# Patient Record
Sex: Female | Born: 1990 | Race: White | Hispanic: No | Marital: Single | Smoking: Former smoker
Health system: Southern US, Community
[De-identification: ages and names within clinical notes are randomized; demographics above are authoritative.]

## PROBLEM LIST (undated history)

## (undated) ENCOUNTER — Ambulatory Visit: Admission: EM | Payer: Self-pay | Source: Home / Self Care

## (undated) ENCOUNTER — Ambulatory Visit: Admission: EM | Payer: Medicaid Other | Source: Home / Self Care

## (undated) ENCOUNTER — Emergency Department: Admission: EM | Source: Home / Self Care

## (undated) DIAGNOSIS — Z8619 Personal history of other infectious and parasitic diseases: Secondary | ICD-10-CM

## (undated) DIAGNOSIS — R87629 Unspecified abnormal cytological findings in specimens from vagina: Secondary | ICD-10-CM

## (undated) DIAGNOSIS — F32A Depression, unspecified: Secondary | ICD-10-CM

## (undated) DIAGNOSIS — M542 Cervicalgia: Secondary | ICD-10-CM

## (undated) DIAGNOSIS — F99 Mental disorder, not otherwise specified: Secondary | ICD-10-CM

## (undated) DIAGNOSIS — D649 Anemia, unspecified: Secondary | ICD-10-CM

## (undated) DIAGNOSIS — F1910 Other psychoactive substance abuse, uncomplicated: Secondary | ICD-10-CM

## (undated) DIAGNOSIS — F419 Anxiety disorder, unspecified: Secondary | ICD-10-CM

## (undated) DIAGNOSIS — L509 Urticaria, unspecified: Secondary | ICD-10-CM

## (undated) DIAGNOSIS — K759 Inflammatory liver disease, unspecified: Secondary | ICD-10-CM

## (undated) HISTORY — DX: Anemia, unspecified: D64.9

## (undated) HISTORY — DX: Personal history of other infectious and parasitic diseases: Z86.19

## (undated) HISTORY — DX: Depression, unspecified: F32.A

## (undated) HISTORY — DX: Inflammatory liver disease, unspecified: K75.9

## (undated) HISTORY — DX: Cervicalgia: M54.2

## (undated) HISTORY — DX: Unspecified abnormal cytological findings in specimens from vagina: R87.629

## (undated) HISTORY — DX: Other psychoactive substance abuse, uncomplicated: F19.10

## (undated) HISTORY — DX: Anxiety disorder, unspecified: F41.9

## (undated) HISTORY — DX: Mental disorder, not otherwise specified: F99

---
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
## 2009-01-08 ENCOUNTER — Emergency Department: Payer: Self-pay | Admitting: Emergency Medicine

## 2009-02-28 DIAGNOSIS — M542 Cervicalgia: Secondary | ICD-10-CM | POA: Insufficient documentation

## 2009-03-12 ENCOUNTER — Emergency Department: Payer: Self-pay | Admitting: Emergency Medicine

## 2009-03-31 ENCOUNTER — Emergency Department: Payer: Self-pay

## 2009-05-04 ENCOUNTER — Emergency Department: Payer: Self-pay | Admitting: Emergency Medicine

## 2009-08-06 ENCOUNTER — Emergency Department: Payer: Self-pay | Admitting: Emergency Medicine

## 2009-08-15 ENCOUNTER — Emergency Department: Payer: Self-pay | Admitting: Unknown Physician Specialty

## 2010-08-03 ENCOUNTER — Emergency Department: Payer: Self-pay | Admitting: Emergency Medicine

## 2011-12-21 ENCOUNTER — Observation Stay: Payer: Self-pay | Admitting: Obstetrics and Gynecology

## 2011-12-21 LAB — URINALYSIS, COMPLETE
Bilirubin,UR: NEGATIVE
Blood: NEGATIVE
Glucose,UR: NEGATIVE mg/dL (ref 0–75)
Ketone: NEGATIVE
Nitrite: NEGATIVE
Ph: 7 (ref 4.5–8.0)
Squamous Epithelial: 2

## 2011-12-23 LAB — URINE CULTURE

## 2012-01-24 ENCOUNTER — Inpatient Hospital Stay: Payer: Self-pay

## 2012-01-24 LAB — CBC WITH DIFFERENTIAL/PLATELET
Basophil #: 0 10*3/uL (ref 0.0–0.1)
HCT: 34.1 % — ABNORMAL LOW (ref 35.0–47.0)
HGB: 11.8 g/dL — ABNORMAL LOW (ref 12.0–16.0)
Lymphocyte %: 19.5 %
MCH: 32.4 pg (ref 26.0–34.0)
Monocyte #: 1.1 10*3/uL — ABNORMAL HIGH (ref 0.0–0.7)
Monocyte %: 10.7 %
Neutrophil #: 6.9 10*3/uL — ABNORMAL HIGH (ref 1.4–6.5)
Neutrophil %: 69.5 %
RDW: 13.9 % (ref 11.5–14.5)
WBC: 10 10*3/uL (ref 3.6–11.0)

## 2012-04-16 ENCOUNTER — Emergency Department: Payer: Self-pay | Admitting: Emergency Medicine

## 2012-04-16 LAB — CBC
HCT: 39.8 % (ref 35.0–47.0)
MCHC: 32.4 g/dL (ref 32.0–36.0)
MCV: 94 fL (ref 80–100)
Platelet: 256 10*3/uL (ref 150–440)
WBC: 11.6 10*3/uL — ABNORMAL HIGH (ref 3.6–11.0)

## 2012-04-16 LAB — COMPREHENSIVE METABOLIC PANEL
Alkaline Phosphatase: 117 U/L (ref 50–136)
Anion Gap: 9 (ref 7–16)
BUN: 10 mg/dL (ref 7–18)
Bilirubin,Total: 0.9 mg/dL (ref 0.2–1.0)
Calcium, Total: 9 mg/dL (ref 8.5–10.1)
Creatinine: 0.77 mg/dL (ref 0.60–1.30)
EGFR (Non-African Amer.): >60
Osmolality: 275 (ref 275–301)
SGOT(AST): 23 U/L (ref 15–37)
SGPT (ALT): 20 U/L
Sodium: 139 mmol/L (ref 136–145)
Total Protein: 8 g/dL (ref 6.4–8.2)

## 2012-04-16 LAB — PREGNANCY, URINE: Pregnancy Test, Urine: NEGATIVE m[IU]/mL

## 2012-04-18 LAB — URINE CULTURE

## 2012-11-03 ENCOUNTER — Emergency Department: Payer: Self-pay | Admitting: Emergency Medicine

## 2012-11-03 LAB — URINALYSIS, COMPLETE
Bacteria: NONE SEEN
Bilirubin,UR: NEGATIVE
Glucose,UR: NEGATIVE mg/dL (ref 0–75)
Ketone: NEGATIVE
Ph: 7 (ref 4.5–8.0)
RBC,UR: 98 /HPF (ref 0–5)
Specific Gravity: 1.023 (ref 1.003–1.030)
Squamous Epithelial: 2
WBC UR: 201 /HPF (ref 0–5)

## 2012-11-03 LAB — PREGNANCY, URINE: Pregnancy Test, Urine: NEGATIVE m[IU]/mL

## 2012-12-13 ENCOUNTER — Emergency Department: Payer: Self-pay | Admitting: Emergency Medicine

## 2012-12-13 LAB — URINALYSIS, COMPLETE
Bacteria: NONE SEEN
Blood: NEGATIVE
Nitrite: NEGATIVE
Ph: 6 (ref 4.5–8.0)
Protein: NEGATIVE
RBC,UR: 1 /HPF (ref 0–5)
Specific Gravity: 1.016 (ref 1.003–1.030)
WBC UR: 8 /HPF (ref 0–5)

## 2012-12-13 LAB — CBC
HCT: 34.8 % — ABNORMAL LOW (ref 35.0–47.0)
HGB: 11.6 g/dL — ABNORMAL LOW (ref 12.0–16.0)
MCH: 30.4 pg (ref 26.0–34.0)
Platelet: 260 10*3/uL (ref 150–440)
RBC: 3.82 10*6/uL (ref 3.80–5.20)

## 2012-12-13 LAB — COMPREHENSIVE METABOLIC PANEL WITH GFR
Albumin: 4 g/dL
Alkaline Phosphatase: 80 U/L
Anion Gap: 9
BUN: 8 mg/dL
Bilirubin,Total: 0.3 mg/dL
Calcium, Total: 8.7 mg/dL
Chloride: 106 mmol/L
Co2: 23 mmol/L
Creatinine: 0.47 mg/dL — ABNORMAL LOW
EGFR (African American): >60
EGFR (Non-African Amer.): >60
Glucose: 85 mg/dL
Osmolality: 273
Potassium: 3.5 mmol/L
SGOT(AST): 17 U/L
SGPT (ALT): 16 U/L
Sodium: 138 mmol/L
Total Protein: 7.7 g/dL

## 2012-12-13 LAB — HCG, QUANTITATIVE, PREGNANCY: Beta Hcg, Quant.: 79174 m[IU]/mL — ABNORMAL HIGH

## 2013-02-10 ENCOUNTER — Emergency Department: Payer: Self-pay | Admitting: Emergency Medicine

## 2013-02-10 LAB — COMPREHENSIVE METABOLIC PANEL
Albumin: 4 g/dL (ref 3.4–5.0)
Anion Gap: 5 — ABNORMAL LOW (ref 7–16)
BUN: 9 mg/dL (ref 7–18)
Calcium, Total: 8.4 mg/dL — ABNORMAL LOW (ref 8.5–10.1)
Co2: 27 mmol/L (ref 21–32)
Creatinine: 0.67 mg/dL (ref 0.60–1.30)
EGFR (African American): >60
Glucose: 81 mg/dL (ref 65–99)
Osmolality: 279 (ref 275–301)
Potassium: 3.5 mmol/L (ref 3.5–5.1)
SGOT(AST): 21 U/L (ref 15–37)
SGPT (ALT): 14 U/L (ref 12–78)
Sodium: 141 mmol/L (ref 136–145)
Total Protein: 7.6 g/dL (ref 6.4–8.2)

## 2013-02-10 LAB — CBC
HCT: 36.7 % (ref 35.0–47.0)
MCH: 31.2 pg (ref 26.0–34.0)
MCHC: 34 g/dL (ref 32.0–36.0)
Platelet: 222 10*3/uL (ref 150–440)
RBC: 4 10*6/uL (ref 3.80–5.20)

## 2013-02-10 LAB — URINALYSIS, COMPLETE
Bilirubin,UR: NEGATIVE
Glucose,UR: NEGATIVE mg/dL (ref 0–75)
Nitrite: NEGATIVE
Ph: 6 (ref 4.5–8.0)
Protein: NEGATIVE
Specific Gravity: 1.009 (ref 1.003–1.030)
WBC UR: 17 /HPF (ref 0–5)

## 2013-02-10 LAB — LIPASE, BLOOD: Lipase: 69 U/L — ABNORMAL LOW (ref 73–393)

## 2013-02-11 ENCOUNTER — Emergency Department: Payer: Self-pay | Admitting: Emergency Medicine

## 2013-02-11 LAB — BASIC METABOLIC PANEL
BUN: 11 mg/dL (ref 7–18)
Calcium, Total: 8.6 mg/dL (ref 8.5–10.1)
Chloride: 104 mmol/L (ref 98–107)
EGFR (Non-African Amer.): >60
Glucose: 78 mg/dL (ref 65–99)
Osmolality: 274 (ref 275–301)
Potassium: 3.1 mmol/L — ABNORMAL LOW (ref 3.5–5.1)
Sodium: 138 mmol/L (ref 136–145)

## 2013-02-11 LAB — CBC WITH DIFFERENTIAL/PLATELET
Basophil #: 0 10*3/uL (ref 0.0–0.1)
HCT: 36.3 % (ref 35.0–47.0)
Lymphocyte #: 1.9 10*3/uL (ref 1.0–3.6)
Lymphocyte %: 33.4 %
MCHC: 33.5 g/dL (ref 32.0–36.0)
MCV: 93 fL (ref 80–100)
Neutrophil #: 3.2 10*3/uL (ref 1.4–6.5)
Neutrophil %: 55.8 %
Platelet: 215 10*3/uL (ref 150–440)
RBC: 3.92 10*6/uL (ref 3.80–5.20)

## 2013-02-14 ENCOUNTER — Emergency Department: Payer: Self-pay | Admitting: Emergency Medicine

## 2014-05-25 ENCOUNTER — Inpatient Hospital Stay: Payer: Self-pay | Admitting: Obstetrics and Gynecology

## 2014-05-26 LAB — CBC WITH DIFFERENTIAL/PLATELET
BASOS ABS: 0 10*3/uL (ref 0.0–0.1)
Basophil %: 0.4 %
EOS ABS: 0.1 10*3/uL (ref 0.0–0.7)
Eosinophil %: 0.7 %
HCT: 34.4 % — ABNORMAL LOW (ref 35.0–47.0)
HGB: 11.5 g/dL — ABNORMAL LOW (ref 12.0–16.0)
Lymphocyte #: 2 10*3/uL (ref 1.0–3.6)
Lymphocyte %: 22 %
MCH: 31.7 pg (ref 26.0–34.0)
MCHC: 33.6 g/dL (ref 32.0–36.0)
MCV: 94 fL (ref 80–100)
Monocyte #: 0.8 x10 3/mm (ref 0.2–0.9)
Monocyte %: 8.2 %
NEUTROS ABS: 6.4 10*3/uL (ref 1.4–6.5)
Neutrophil %: 68.7 %
Platelet: 119 10*3/uL — ABNORMAL LOW (ref 150–440)
RBC: 3.64 10*6/uL — AB (ref 3.80–5.20)
RDW: 13.6 % (ref 11.5–14.5)
WBC: 9.3 10*3/uL (ref 3.6–11.0)

## 2014-05-26 LAB — DRUG SCREEN, URINE
Amphetamines, Ur Screen: NEGATIVE (ref ?–1000)
BENZODIAZEPINE, UR SCRN: NEGATIVE (ref ?–200)
Barbiturates, Ur Screen: NEGATIVE (ref ?–200)
Cannabinoid 50 Ng, Ur ~~LOC~~: POSITIVE (ref ?–50)
Cocaine Metabolite,Ur ~~LOC~~: POSITIVE (ref ?–300)
MDMA (Ecstasy)Ur Screen: NEGATIVE (ref ?–500)
METHADONE, UR SCREEN: NEGATIVE (ref ?–300)
Opiate, Ur Screen: POSITIVE (ref ?–300)
Phencyclidine (PCP) Ur S: NEGATIVE (ref ?–25)
Tricyclic, Ur Screen: NEGATIVE (ref ?–1000)

## 2014-05-26 LAB — GC/CHLAMYDIA PROBE AMP

## 2014-05-27 LAB — HEMATOCRIT: HCT: 28.6 % — ABNORMAL LOW (ref 35.0–47.0)

## 2015-03-03 NOTE — Op Note (Signed)
PATIENT NAME:  [name], Lori Pollard MR#:  [id number] DATE OF BIRTH:  1991-01-28  DATE OF PROCEDURE:  05/26/2014  PREOPERATIVE DIAGNOSES:  1.  Term pregnancy.  2.  Prior history of cesarean section.   POSTOPERATIVE DIAGNOSES:  1.  Term pregnancy.  2.  Prior history of cesarean section.   PROCEDURE PERFORMED: Low transverse cesarean section.   SURGEON: Dierdre Searles. Paul Tatumn Corbridge, MD   ANESTHESIA: Spinal.   ESTIMATED BLOOD LOSS: 250 mL.   COMPLICATIONS: None.   FINDINGS: Normal tubes, ovaries and uterus. Viable female weighing 4 pounds 12 ounces with Apgar scores of 8 and 9 at one and five minutes, respectively.   DISPOSITION: To the recovery room in stable condition.   TECHNIQUE: The patient is prepped and draped in the usual sterile fashion after adequate anesthesia is obtained in the supine position on the operating room table. Scalpel is used to create a low transverse skin incision and then the rectus fascia is dissected bilaterally using Mayo scissors. Rectus muscle is separated in the midline. The peritoneum is penetrated. The bladder is inferiorly retracted. A scalpel is used to create a low transverse hysterotomy incision that is extended by blunt dissection. Amniotomy then reveals clear fluid. Head is delivered with suctioning of the oropharynx and then vigorous cry. Infant is handed to the pediatric team.   Cord blood is obtained and the placenta is manually extracted. The uterus is externalized and cleansed of all membranes and debris using a moist sponge. The hysterotomy incision is closed using running #1 Vicryl suture in a locking fashion followed by a second layer to imbricate the first layer. Excellent hemostasis is noted. The uterus is placed back in the intra-abdominal cavity. The paracolic gutters are irrigated with warm saline. The rectus muscles are plicated and then the rectus fascia is closed with a 0 Maxon suture. Subcutaneous tissues are irrigated and hemostasis is assured using  electrocautery. Skin is closed with 4-0 Vicryl suture in a subcuticular fashion followed by Dermabond. The patient goes to the recovery room in stable condition having tolerated the procedure well. All sponge, instrument, and needle counts are correct.  ____________________________ R. Annamarie MajorPaul Rianne Degraaf, MD rph:sb Pollard: 05/26/2014 03:22:06 ET T: 05/26/2014 07:29:18 ET JOB#: [id number]  cc: Dierdre Searles. Paul Mitchel Delduca, MD, <Dictator> Nadara MustardOBERT P Retaj Hilbun MD ELECTRONICALLY SIGNED 05/29/2014 18:23

## 2015-03-04 NOTE — Op Note (Signed)
PATIENT NAME:  Lori Pollard, [name] MR#:  [id number] DATE OF BIRTH:  [birth year]  DATE OF PROCEDURE:  01/24/2012  PREOPERATIVE DIAGNOSES:  501. 24 year old G2, P2, 0-0-1-0 presenting in spontaneous labor at 39 weeks 2 days gestation.  2. Meconium stained amniotic fluid.  3. Nonreassuring fetal surveillance/fatal intolerance of labor.   POSTOPERATIVE DIAGNOSES:  431. 24 year old G2, P2, 0-0-1-0 presenting in spontaneous labor at 39 weeks 2 days gestation.  2. Meconium stained amniotic fluid.  3. Nonreassuring fetal surveillance/fatal intolerance of labor.   PROCEDURE PERFORMED: Primary low transverse cesarean section via Pfannenstiel skin incision.   SURGEON: Florina Oundreas M. Bonney AidStaebler, MD  ASSISTANT: Epimenio SarinShelby Street - Surgical Tech  ANESTHESIA: Spinal.   ESTIMATED BLOOD LOSS: 500 mL.  OPERATIVE FLUIDS: 1600 mL of crystalloid.   COMPLICATIONS: None.   INTRAOPERATIVE FINDINGS: Normal uterus, tubes, and ovaries. Infant in the OA position. Female. Weight 2680 grams (5 pounds, 15 ounces). Apgars 9 and 9.   DRAINS OR TUBES:  Foley to gravity drainage.   IMPLANTS: On-Q catheters.   ANTIBIOTICS: 2 grams of Ancef.   COMPLICATIONS: None.   PATIENT CONDITION FOLLOWING PROCEDURE: Stable.   PROCEDURE IN DETAIL: The patient presented in term labor and was rapidly progressing and was found to be 9 cm with bulging bag. Amniotomy was performed at that time noting meconium stained fluid. Shortly after artificial rupture of membranes, the fetus had a prolonged deceleration with a nadir in the 70s with slow return to baseline lasting approximately 7 minutes. Following this the patient began exhibiting repetitive late decelerations prompting administration of terbutaline. With administration of terbutaline, the fetal heart rate recovered and the fetus was allowed to resuscitate in utero. However, given the fetal tracing, the decision was made to proceed with low transverse cesarean section for delivery, also  secondary to the fact that the patient had some cervix reform following the rupture of membranes and was found to be 7 cm. The risks, benefits, and alternatives were discussed with the patient who elected to proceed with cesarean delivery.   The patient was taken to the Operating Room and had a spinal placed with continuous fetal monitoring during this time via FSE. The patient was then positioned in the supine position, prepped and draped in the usual fashion. A Pfannenstiel skin incision was made 2 cm above the pubic symphysis and carried down sharply to the rectus fascia which was incised with the scalpel. The rectus fascia was then extended bluntly using the operator's finger. The midline was identified. The muscles were separated bluntly, and the peritoneum was entered bluntly. A bladder blade was placed. Next, a low transverse uterine incision was made and the uterus was entered bluntly using the operator's finger. The infant was noted to be in the OA position. The vertex was grasped, flexed, brought to hysterotomy incision and delivered atraumatically using fundal pressure. The infant began crying almost instantaneously. The cord was clamped and cut, and the infant was passed to the awaiting pediatricians. A cord blood sample as well as cord gas were obtained. Cord gas revealed a pH of 7.31 with a base excess of minus 2.3. Following delivery of the infant, the placenta was delivered via manual extraction. The uterus was then exteriorized and wiped clean of clots and debris. The hysterotomy incision was closed using a two layer closure of 0 Vicryl, the first layer being a running locked stitch and the second a horizontal imbricating. Following this the hysterotomy incision was inspected and noted to be hemostatic. The uterus was returned  to the abdomen. The gutters were wiped clean of clots using two moist laps. The peritoneum was then closed using a running stitch of 2-0 Vicryl. Two On-Q catheters were then  placed through the superior border of the rectus fascia and positioned underneath the rectus fascia. The rectus fascia was then closed using a single running #1 looped PDS. The subcutaneous tissue was irrigated and hemostasis was achieved using the Bovie. The skin was closed using a 4-0 Monocryl in a subcuticular fashion. The On-Q catheters were primed with 5 mL of 0.5% bupivacaine each. The patient tolerated the procedure well. Sponge, needle, and instrument counts were correct x2. The patient was taken to Recovery Room in stable condition. ____________________________ Florina Ou. Bonney Aid, MD ams:slb D: 01/25/2012 14:12:00 ET T: 01/25/2012 14:35:51 ET JOB#: [id number]  cc: Florina Ou. Bonney Aid, MD, <Dictator> [place] Cathrine Muster MD ELECTRONICALLY SIGNED 01/28/2012 13:26

## 2015-03-20 NOTE — H&P (Signed)
[name]:  History Expanded:  HPI 24 yo G3P1011 at 4638 2/[redacted] weeks EGA w IUGR stable w surveillance who presents w contraction and pain tonight, since 0700 earlier today.  No vaginal bleeding or ROM.  [facility]  Planned for 39 week Cesarean Section for prior. Smoker.  O+, RI, VI, GBS -,   Gravida 32   Term 1   PreTerm 0   Abortion 1   Living 1   Blood Type (Maternal) O positive   Group B Strep Results Maternal (Result >5wks must be treated as unknown) negative   Maternal Varicella Immune   Rubella Results (Maternal) immune   EDC 07-Jun-2014   Presents with contractions   Patient's Medical History Underweight   Patient's Surgical History Previous C-Section   Medications Pre Natal Vitamins   Allergies NKDA   Social History tobacco   Family History Non-Contributory   ROS:  ROS All systems were reviewed.  HEENT, CNS, GI, GU, Respiratory, CV, Renal and Musculoskeletal systems were found to be normal.   Exam:  Vital Signs stable   General no apparent distress   Mental Status clear   Chest clear   Heart normal sinus rhythm   Abdomen gravid, tender with contractions   Estimated Fetal Weight Average for gestational age   Back no CVAT   Edema no edema   Pelvic no external lesions, 1-2/80/0, Vtx engaging downward, bloody show   Mebranes Intact   FHT normal rate with no decels   Ucx regular   Ucx Frequency 8 min   Skin dry   Impression:  Impression early labor   Plan:  Plan EFM/NST, monitor contractions and for cervical change   Comments Cesarean Section vs continued exp management, due to pain and 38+ weeks will proceed w Cesarean Section.  Risks of IUGR discussed.   LTCS Consent: I have had an exceedingly long and careful discussion with this patient about her circumstances and options available. She understands that I have carefully evaluated the infant's fetal heart rate pattern, the  probable time remaining in her labor and her reserve. We are at a point where one of them will need to take a potential risk, either she take the risk of a cesarean section or the infant take a risk that the fetal intolerance to labor is very significant with the potential for a real effect on the baby which will worsen if we allow labor to continue.  She also understands that interpretation of the FHT is not a precise science and that someone else might allow labor to continue for the present. Mutual decision made to proceed with abdominal surgery.  Fully informed consent obtained including the risks of anesthesia, hemorrhage, infection, injury to adjacent structures, bowel, bladder, and blood vessels..  Electronic Signatures: Letitia LibraHarris, Loraine Freid Paul (MD)  (Signed 17-Jul-15 01:21)  Authored: [name], Consent   Last Updated: 17-Jul-15 01:21 by Letitia LibraHarris, Breckyn Ticas Paul (MD)

## 2015-03-20 NOTE — H&P (Signed)
[name]:  History:   HPI 24 yo G2P0010 at 5471w2d prsenting with contractions.  +FM, no LOF, no VB    Presents with contractions    Patient's Medical History anemia    Patient's Surgical History none    Medications Pre Natal Vitamins    Allergies NKDA    Social History tobacco   Exam:   Vital Signs stable    Urine Protein not completed    General other, painfully contracting    Abdomen gravid, tender with contractions    Estimated Fetal Weight Small for gestational age    Fetal Position vtx    Pelvic no external lesions, 1/C/-2 change to 5/C/-1 per nursing    Mebranes Intact    FHT normal rate with no decels    Ucx regular   Impression:   Impression active labor   Plan:   Plan EFM/NST, monitor contractions and for cervical change    Comments - stadol while awiating epidural, admit for term labor   Electronic Signatures: Lori Pollard, Lori Pollard (MD)  (Signed 16-Mar-13 11:16)  Authored: [name]   Last Updated: 16-Mar-13 11:16 by Lori Pollard, Lori Pollard (MD)

## 2015-03-20 NOTE — H&P (Signed)
[name]:  History:   HPI 24 yo G2 P0010 at 3262w5d GA by 9 week u/s, pregnancy complicated by smoking.  Presents with contractions since about 5pm.  She had been out shopping and walking all day.  Her contractions have come closer and were closer together. She notes +FM, no LOF, no VB.  O+, RI, RPR NR, VZI, GBS unk    Presents with contractions    Patient's Medical History No Chronic Illness    Patient's Surgical History none    Medications Pre Natal Vitamins    Allergies NKDA    Social History tobacco    Family History Non-Contributory   ROS:   ROS All systems were reviewed.  HEENT, CNS, GI, GU, Respiratory, CV, Renal and Musculoskeletal systems were found to be normal.   Exam:   Vital Signs stable    General no apparent distress    Mental Status clear    Chest clear    Heart normal sinus rhythm    Abdomen gravid, non-tender    Pelvic cervix closed and thick, per RN    Mebranes Intact    FHT normal rate with no decels    FHT Description 130/mod var/+accels/no decels    Ucx irregular, 1q 10 min   Impression:   Impression preterm contractions, no evidence of labor   Plan:   Plan UA, EFM/NST, monitor contractions and for cervical change, discharge    Comments discharge will f/u urine culture, but not treat at this time keep previously scheduled appt., but have low threshold to make work-in appt, if symptoms continue or worsen.    Follow Up Appointment already scheduled   Electronic Signatures: Conard NovakJackson, Glyn Zendejas D (MD)  (Signed 10-Feb-13 22:01)  Authored: [name]   Last Updated: 10-Feb-13 22:01 by Conard NovakJackson, Solash Tullo D (MD)

## 2015-08-16 ENCOUNTER — Emergency Department
Admission: EM | Admit: 2015-08-16 | Discharge: 2015-08-16 | Disposition: A | Discharge status: Home / Self Care | Payer: Medicaid Other | Attending: Emergency Medicine | Admitting: Emergency Medicine

## 2015-08-16 ENCOUNTER — Encounter: Payer: Self-pay | Admitting: Emergency Medicine

## 2015-08-16 DIAGNOSIS — L509 Urticaria, unspecified: Secondary | ICD-10-CM | POA: Insufficient documentation

## 2015-08-16 DIAGNOSIS — R21 Rash and other nonspecific skin eruption: Secondary | ICD-10-CM | POA: Diagnosis present

## 2015-08-16 HISTORY — DX: Urticaria, unspecified: L50.9

## 2015-08-16 MED ORDER — DIPHENHYDRAMINE HCL 50 MG/ML IJ SOLN
INTRAMUSCULAR | Status: AC
  Filled 2015-08-16: qty 1

## 2015-08-16 MED ORDER — DIPHENHYDRAMINE HCL 50 MG/ML IJ SOLN
50.0000 mg | Freq: Once | INTRAMUSCULAR | Status: AC
  Administered 2015-08-16: 50 mg via INTRAVENOUS
  Filled 2015-08-16: qty 1

## 2015-08-16 MED ORDER — DIPHENHYDRAMINE HCL 50 MG/ML IJ SOLN
25.0000 mg | Freq: Once | INTRAMUSCULAR | Status: AC
  Administered 2015-08-16: 25 mg via INTRAVENOUS
  Filled 2015-08-16: qty 1

## 2015-08-16 MED ORDER — METHYLPREDNISOLONE 4 MG PO TBPK: ORAL_TABLET | ORAL | Status: DC

## 2015-08-16 MED ORDER — DIPHENHYDRAMINE HCL 25 MG PO CAPS: 25.0000 mg | ORAL_CAPSULE | ORAL | Status: DC | PRN

## 2015-08-16 MED ORDER — METHYLPREDNISOLONE SODIUM SUCC 125 MG IJ SOLR
125.0000 mg | Freq: Once | INTRAMUSCULAR | Status: AC
  Administered 2015-08-16: 125 mg via INTRAVENOUS
  Filled 2015-08-16: qty 2

## 2015-08-16 MED ORDER — FAMOTIDINE IN NACL 20-0.9 MG/50ML-% IV SOLN
20.0000 mg | Freq: Once | INTRAVENOUS | Status: AC
  Administered 2015-08-16: 20 mg via INTRAVENOUS
  Filled 2015-08-16: qty 50

## 2015-08-16 NOTE — ED Notes (Signed)
Pt woke this morning with hives and itching. States she has a history of hives with cause unknown. Pt denies using any new skin care products, detergents or new foods.

## 2015-08-16 NOTE — ED Provider Notes (Signed)
[facility] Emergency Department Provider Note     Time seen: ----------------------------------------- 7:06 AM on 08/16/2015 -----------------------------------------    I have reviewed the triage vital signs and the nursing notes.   HISTORY  Chief Complaint No chief complaint on file.    HPI Lori Pollard is a 24 y.o. female who presents ER with hives and itching all over her body. Patient states slowly progressing this very pruritic. Patient reports a history of same, hasn't had one in 3 years. She has recently started her menstrual cycle which is not started in the last 3 years. She also takes Suboxone, but has been on this for 10 months. She denies any changes in her soap, detergent or shampoos. No past medical history on file.  There are no active problems to display for this patient.   No past surgical history on file.  Allergies Review of patient's allergies indicates not on file.  Social History Social History  Substance Use Topics  . Smoking status: Not on file  . Smokeless tobacco: Not on file  . Alcohol Use: Not on file    Review of Systems Constitutional: Negative for fever. Eyes: Negative for visual changes. ENT: Negative for sore throat. Cardiovascular: Negative for chest pain. Respiratory: Negative for shortness of breath. Gastrointestinal: Negative for abdominal pain, vomiting and diarrhea. Genitourinary: Negative for dysuria. Musculoskeletal: Negative for back pain. Skin: Positive for rash and itching Neurological: Negative for headaches, focal weakness or numbness.  10-point ROS otherwise negative.  ____________________________________________   PHYSICAL EXAM:  VITAL SIGNS: ED Triage Vitals  Enc Vitals Group     BP --      Pulse --      Resp --      Temp --      Temp src --      SpO2 --      Weight --      Height --      Head Cir --      Peak Flow --      Pain Score --      Pain Loc --    Pain Edu? --      Excl. in GC? --     Constitutional: Alert and oriented. Mild distress Eyes: Conjunctivae are normal. PERRL. Normal extraocular movements. ENT   Head: Normocephalic and atraumatic.   Nose: No congestion/rhinnorhea.   Mouth/Throat: Mucous membranes are moist.   Neck: No stridor. Cardiovascular: Normal rate, regular rhythm. Normal and symmetric distal pulses are present in all extremities. No murmurs, rubs, or gallops. Respiratory: Normal respiratory effort without tachypnea nor retractions. Breath sounds are clear and equal bilaterally. No wheezes/rales/rhonchi. Gastrointestinal: Soft and nontender. No distention.  Musculoskeletal: Nontender with normal range of motion in all extremities. No joint effusions.  No lower extremity tenderness nor edema. Neurologic:  Normal speech and language. No gross focal neurologic deficits are appreciated. Speech is normal. No gait instability. Skin:  Diffuse urticarial lesions are noted over her trunk and extremities. The face seems to be spared at this time. Psychiatric: Mood and affect are normal. Speech and behavior are normal. Patient exhibits appropriate insight and judgment.  ____________________________________________  ED COURSE:  Pertinent labs & imaging results that were available during my care of the patient were reviewed by me and considered in my medical decision making (see chart for details). Patient with urticaria, questionable hormonal or stress related.  ____________________________________________  FINAL ASSESSMENT AND PLAN  Urticaria  Plan: Patient with labs and imaging as dictated  above. Patient was given Benadryl, Pepcid and Solu-Medrol. Patient did require repeat dosing of Benadryl and Solu-Medrol here. She'll be discharged with Benadryl and Medrol Dosepak. She is stable for outpatient follow-up.   Emily Filbert, MD   Emily Filbert, MD 08/16/15 [phone]

## 2015-08-16 NOTE — ED Notes (Signed)
Pt rash decreased. Pt states itching is greatly reduced. Pt resting, boyfriend at bedside.

## 2015-08-16 NOTE — Discharge Instructions (Signed)
Hives Hives are itchy, red, swollen areas of the skin. They can vary in size and location on your body. Hives can come and go for hours or several days (acute hives) or for several weeks (chronic hives). Hives do not spread from person to person (noncontagious). They may get worse with scratching, exercise, and emotional stress. CAUSES   Allergic reaction to food, additives, or drugs.  Infections, including the common cold.  Illness, such as vasculitis, lupus, or thyroid disease.  Exposure to sunlight, heat, or cold.  Exercise.  Stress.  Contact with chemicals. SYMPTOMS   Red or white swollen patches on the skin. The patches may change size, shape, and location quickly and repeatedly.  Itching.  Swelling of the hands, feet, and face. This may occur if hives develop deeper in the skin. DIAGNOSIS  Your caregiver can usually tell what is wrong by performing a physical exam. Skin or blood tests may also be done to determine the cause of your hives. In some cases, the cause cannot be determined. TREATMENT  Mild cases usually get better with medicines such as antihistamines. Severe cases may require an emergency epinephrine injection. If the cause of your hives is known, treatment includes avoiding that trigger.  HOME CARE INSTRUCTIONS   Avoid causes that trigger your hives.  Take antihistamines as directed by your caregiver to reduce the severity of your hives. Non-sedating or low-sedating antihistamines are usually recommended. Do not drive while taking an antihistamine.  Take any other medicines prescribed for itching as directed by your caregiver.  Wear loose-fitting clothing.  Keep all follow-up appointments as directed by your caregiver. SEEK MEDICAL CARE IF:   You have persistent or severe itching that is not relieved with medicine.  You have painful or swollen joints. SEEK IMMEDIATE MEDICAL CARE IF:   You have a fever.  Your tongue or lips are swollen.  You have  trouble breathing or swallowing.  You feel tightness in the throat or chest.  You have abdominal pain. These problems may be the first sign of a life-threatening allergic reaction. Call your local emergency services (911 in [place] MAKE SURE YOU:   Understand these instructions.  Will watch your condition.  Will get help right away if you are not doing well or get worse.   This information is not intended to replace advice given to you by your health care provider. Make sure you discuss any questions you have with your health care provider.   Document Released: 10/27/2005 Document Revised: 11/01/2013 Document Reviewed: 01/20/2012 Elsevier Interactive Patient Education 2016 [employer]  

## 2015-08-16 NOTE — ED Notes (Signed)
Pt presents with widespread rash and itching. Denies difficulty breathing. No acute distress noted.

## 2015-08-16 NOTE — ED Notes (Signed)
Patient presents for hives over entire body. States they are progressing and very itchy. Denies any new contacts, soaps or lotions. States just itchy. Denies difficulty breathing.

## 2015-09-18 ENCOUNTER — Ambulatory Visit (INDEPENDENT_AMBULATORY_CARE_PROVIDER_SITE_OTHER): Payer: Medicaid Other | Admitting: Family Medicine

## 2015-09-18 ENCOUNTER — Encounter: Payer: Self-pay | Admitting: Family Medicine

## 2015-09-18 VITALS — BP 104/68 | HR 85 | Temp 97.9°F | Resp 16 | Ht 59.0 in | Wt 87.9 lb

## 2015-09-18 DIAGNOSIS — N926 Irregular menstruation, unspecified: Secondary | ICD-10-CM | POA: Diagnosis not present

## 2015-09-18 DIAGNOSIS — K5903 Drug induced constipation: Secondary | ICD-10-CM | POA: Insufficient documentation

## 2015-09-18 DIAGNOSIS — R599 Enlarged lymph nodes, unspecified: Secondary | ICD-10-CM | POA: Diagnosis not present

## 2015-09-18 DIAGNOSIS — F3131 Bipolar disorder, current episode depressed, mild: Secondary | ICD-10-CM

## 2015-09-18 DIAGNOSIS — F4310 Post-traumatic stress disorder, unspecified: Secondary | ICD-10-CM | POA: Insufficient documentation

## 2015-09-18 DIAGNOSIS — Z23 Encounter for immunization: Secondary | ICD-10-CM | POA: Diagnosis not present

## 2015-09-18 DIAGNOSIS — N644 Mastodynia: Secondary | ICD-10-CM

## 2015-09-18 DIAGNOSIS — J45909 Unspecified asthma, uncomplicated: Secondary | ICD-10-CM | POA: Insufficient documentation

## 2015-09-18 DIAGNOSIS — F319 Bipolar disorder, unspecified: Secondary | ICD-10-CM | POA: Insufficient documentation

## 2015-09-18 DIAGNOSIS — L509 Urticaria, unspecified: Secondary | ICD-10-CM | POA: Diagnosis not present

## 2015-09-18 DIAGNOSIS — R590 Localized enlarged lymph nodes: Secondary | ICD-10-CM

## 2015-09-18 DIAGNOSIS — F1121 Opioid dependence, in remission: Secondary | ICD-10-CM | POA: Insufficient documentation

## 2015-09-18 LAB — POCT URINE PREGNANCY: Preg Test, Ur: NEGATIVE

## 2015-09-18 MED ORDER — DIVALPROEX SODIUM 250 MG PO DR TAB
250.0000 mg | DELAYED_RELEASE_TABLET | Freq: Two times a day (BID) | ORAL | Status: DC
Start: 2015-09-18 — End: 2019-06-16

## 2015-09-18 MED ORDER — AMOXICILLIN-POT CLAVULANATE 875-125 MG PO TABS: 1.0000 | ORAL_TABLET | Freq: Two times a day (BID) | ORAL | Status: DC

## 2015-09-18 NOTE — Progress Notes (Signed)
Name: Lori Pollard   MRN: [id number]    DOB: 1990-12-25   Date:09/18/2015       Progress Note  Subjective  Chief Complaint  Chief Complaint  Patient presents with  . Contraception    Since patient has had nexplanon put in on 09/17/2014, the past month she has been had bleeding non-stop, constant hives, and mood swings. Patient went to the ER June 16, 2015 with Hives and was diagnosed with Urticaria, given Prednisone, Benadryl, and Pepcid. Patient would like to discuss other opinions of birth control. Patient also is having a swollen milk gland on left breast.     HPI  Heavy menses irregular: she was started on Nexplanon after the birth of her second child last Summer, she was in a period amenorrhea, but on October 6th, she started to bleed heavily and non stop for the past month. Going from light to heavy cycles. She also has noticed the the hives started happening at the same time. Gets red, itchy and swollen around the nexplanon site and spreads throughout her entire body. She has had a few episodes of hives since October 6th. She has a history of having hives in the past. She was seen by an allergist in the past and was diagnosed with stress.  She states symptoms improves with Benadryl.    Left breast pain: over the past 3 days she noticed swelling, pain and increase in warmth on left nipple. No trauma, not nursing.  She states started after last episode of hives, but has been constant, pain is described as burning and stabbing.  No nipple discharge. Not taking any medications  Lymphadenopathy: behind right ear for months now, growing slowly, no associated night sweats, positive for mild weight loss.   Bipolar Disorder: she is now depressed, stressed because she is a mother of two young children, has difficulty falling asleep. No recent episodes of mania. Used to see psychiatrist years ago and tried 7 different types of medication but states nothing worked. (Cymbalta, Lexapro,  Clonazepam...)   Patient Active Problem List   Diagnosis Date Noted  . Bipolar disorder (HCC) 09/18/2015  . PTSD (post-traumatic stress disorder) 09/18/2015  . RAD (reactive airway disease) with wheezing 09/18/2015  . History of narcotic addiction (HCC) 09/18/2015  . Constipation due to pain medication therapy 09/18/2015  . Cervical pain 02/28/2009    Past Surgical History  Procedure Laterality Date  . Cesarean section      Family History  Problem Relation Age of Onset  . Alcohol abuse Mother   . GER disease Mother   . Depression Mother   . Alcohol abuse Father   . Emphysema Father     Social History   Social History  . Marital Status: Single    Spouse Name: N/A  . Number of Children: N/A  . Years of Education: N/A   Occupational History  . Not on file.   Social History Main Topics  . Smoking status: Current Every Day Smoker -- 0.50 packs/day for 11 years    Types: Cigarettes    Start date: 09/17/2004  . Smokeless tobacco: Never Used  . Alcohol Use: No  . Drug Use: No  . Sexual Activity:    Partners: Male    Birth Control/ Protection: Implant   Other Topics Concern  . Not on file   Social History Narrative     Current outpatient prescriptions:  .  albuterol (PROVENTIL) (2.5 MG/3ML) 0.083% nebulizer solution, Inhale into the lungs., Disp: ,  Rfl:  .  diphenhydrAMINE (BENADRYL) 25 mg capsule, Take 1 capsule (25 mg total) by mouth every 4 (four) hours as needed., Disp: 30 capsule, Rfl: 2 .  docusate sodium (COLACE) 100 MG capsule, Take by mouth., Disp: , Rfl:  .  SUBOXONE 8-2 MG FILM, Place 2.5 Film under the tongue daily as needed., Disp: , Rfl: 0 .  etonogestrel (NEXPLANON) 68 MG IMPL implant, 1 each (68 mg total) by Subdermal route once., Disp: 1 each, Rfl: 0  No Known Allergies   ROS  Constitutional: Negative for fever , mild  weight change.  Respiratory: Negative for cough and shortness of breath.   Cardiovascular: Negative for chest pain or  palpitations.  Gastrointestinal: Negative for abdominal pain, no bowel changes.  Musculoskeletal: Negative for gait problem or joint swelling.  Skin: Positive for intermittent hives Neurological: Negative for dizziness or headache.  No other specific complaints in a complete review of systems (except as listed in HPI above).  Objective  Filed Vitals:   09/18/15 1557  BP: 104/68  Pulse: 85  Temp: 97.9 F (36.6 C)  TempSrc: Oral  Resp: 16  Height: 4\' 11"  (1.499 m)  Weight: 87 lb 14.4 oz (39.871 kg)  SpO2: 93%    Body mass index is 17.74 kg/(m^2).  Physical Exam  Constitutional: Patient appears well-developed and well-nourished.  No distress.  HEENT: head atraumatic, normocephalic, pupils equal and reactive to light,  neck supple, throat within normal limits. Non-tender right posterior auricular lymphadenopathy Cardiovascular: Normal rate, regular rhythm and normal heart sounds.  No murmur heard. No BLE edema. Pulmonary/Chest: Effort normal and breath sounds normal. No respiratory distress. Abdominal: Soft.  There is no tenderness. Psychiatric: Patient has a normal mood and affect. behavior is normal. Judgment and thought content normal. Skin: no hives Breast: dime size tender area, red and increase in warmth area at 5 o'clock on left breast   PHQ2/9: Depression screen PHQ 2/9 09/18/2015  Decreased Interest 1  Down, Depressed, Hopeless 1  PHQ - 2 Score 2  Altered sleeping 3  Tired, decreased energy 3  Change in appetite 3  Feeling bad or failure about yourself  0  Trouble concentrating 0  Moving slowly or fidgety/restless 3  Suicidal thoughts 0  PHQ-9 Score 14  Difficult doing work/chores Somewhat difficult    Fall Risk: Fall Risk  09/18/2015  Falls in the past year? No     Functional Status Survey: Is the patient deaf or have difficulty hearing?: No Does the patient have difficulty seeing, even when wearing glasses/contacts?: No Does the patient have  difficulty concentrating, remembering, or making decisions?: No Does the patient have difficulty walking or climbing stairs?: No Does the patient have difficulty dressing or bathing?: No Does the patient have difficulty doing errands alone such as visiting a doctor's office or shopping?: No    Assessment & Plan   1. Pain of left breast  Possible duct inflammation  - amoxicillin-clavulanate (AUGMENTIN) 875-125 MG tablet; Take 1 tablet by mouth 2 (two) times daily.  Dispense: 20 tablet; Refill: 0  2. Hives  Explained likely unrelated to Nexplanon, but she would like to have it removed  3. Irregular periods/menstrual cycles  - CBC with Differential/Platelet - POCT urine pregnancy - Ambulatory referral to Obstetrics / Gynecology  4. Needs flu shot  - Flu Vaccine QUAD 36+ mos IM  5. Lymphadenopathy, postauricular  - amoxicillin-clavulanate (AUGMENTIN) 875-125 MG tablet; Take 1 tablet by mouth 2 (two) times daily.  Dispense: 20 tablet;  Refill: 0  6. Bipolar affective disorder, currently depressed, mild (HCC)  Start mood stabilizer  - divalproex (DEPAKOTE) 250 MG DR tablet; Take 1 tablet (250 mg total) by mouth 2 (two) times daily.  Dispense: 60 tablet; Refill: 0

## 2015-09-19 LAB — CBC WITH DIFFERENTIAL/PLATELET
BASOS ABS: 0 10*3/uL (ref 0.0–0.2)
BASOS: 0 %
EOS (ABSOLUTE): 0.1 10*3/uL (ref 0.0–0.4)
Eos: 1 %
Hematocrit: 35.6 % (ref 34.0–46.6)
Hemoglobin: 11.9 g/dL (ref 11.1–15.9)
IMMATURE GRANS (ABS): 0 10*3/uL (ref 0.0–0.1)
IMMATURE GRANULOCYTES: 0 %
LYMPHS: 37 %
Lymphocytes Absolute: 2.3 10*3/uL (ref 0.7–3.1)
MCH: 30 pg (ref 26.6–33.0)
MCHC: 33.4 g/dL (ref 31.5–35.7)
MCV: 90 fL (ref 79–97)
Monocytes Absolute: 0.6 10*3/uL (ref 0.1–0.9)
Monocytes: 9 %
NEUTROS PCT: 53 %
Neutrophils Absolute: 3.4 10*3/uL (ref 1.4–7.0)
PLATELETS: 255 10*3/uL (ref 150–379)
RBC: 3.97 x10E6/uL (ref 3.77–5.28)
RDW: 13.6 % (ref 12.3–15.4)
WBC: 6.3 10*3/uL (ref 3.4–10.8)

## 2015-09-25 ENCOUNTER — Telehealth: Payer: Self-pay | Admitting: Family Medicine

## 2015-09-25 NOTE — Telephone Encounter (Signed)
Errenous °

## 2015-09-28 ENCOUNTER — Ambulatory Visit: Payer: Medicaid Other | Admitting: Family Medicine

## 2015-10-10 ENCOUNTER — Encounter: Payer: Medicaid Other | Admitting: Certified Nurse Midwife

## 2015-10-15 ENCOUNTER — Ambulatory Visit: Payer: Medicaid Other | Admitting: Family Medicine

## 2016-04-16 ENCOUNTER — Ambulatory Visit: Payer: Medicaid Other | Admitting: Family Medicine

## 2016-04-29 ENCOUNTER — Ambulatory Visit: Payer: Medicaid Other | Admitting: Family Medicine

## 2017-06-24 ENCOUNTER — Ambulatory Visit (INDEPENDENT_AMBULATORY_CARE_PROVIDER_SITE_OTHER): Payer: Medicaid Other | Admitting: Certified Nurse Midwife

## 2017-06-24 ENCOUNTER — Encounter: Payer: Self-pay | Admitting: Certified Nurse Midwife

## 2017-06-24 VITALS — BP 89/63 | HR 74 | Ht 59.0 in | Wt 82.1 lb

## 2017-06-24 DIAGNOSIS — Z3040 Encounter for surveillance of contraceptives, unspecified: Secondary | ICD-10-CM

## 2017-06-24 DIAGNOSIS — Z30432 Encounter for removal of intrauterine contraceptive device: Secondary | ICD-10-CM | POA: Diagnosis not present

## 2017-06-24 DIAGNOSIS — Z3202 Encounter for pregnancy test, result negative: Secondary | ICD-10-CM | POA: Diagnosis not present

## 2017-06-24 LAB — POCT URINE PREGNANCY: Preg Test, Ur: NEGATIVE

## 2017-06-24 MED ORDER — MEDROXYPROGESTERONE ACETATE 150 MG/ML IM SUSP: 150.0000 mg | Freq: Once | INTRAMUSCULAR | Status: DC

## 2017-06-24 NOTE — Progress Notes (Signed)
Lori Pollard is a 26 y.o. year old No obstetric history on file. Caucasian female here for Nexplanon removal.  She was given informed consent for removal and reinsertion of her Nexplanon. Her Nexplanon was placed 11/18 2015, No LMP recorded. Patient has had an implant.for 3 years, and her pregnancy test today was negative.    BP (!) 89/63   Pulse 74   Ht 4\' 11"  (1.499 m)   Wt 82 lb 1.6 oz (37.2 kg)   BMI 16.58 kg/m  No LMP recorded. Patient has had an implant. Results for orders placed or performed in visit on 06/24/17 (from the past 24 hour(s))  POCT urine pregnancy   Collection Time: 06/24/17  2:49 PM  Result Value Ref Range   Preg Test, Ur Negative Negative     Appropriate time out taken. Nexplanon site identified.  Area prepped in usual sterile fashon. Two cc's of 2% lidocaine was used to anesthetize the area. A small stab incision was made right beside the implant on the distal portion.  The Nexplanon rod was grasped using hemostats and removed intact without difficulty.  Steri-strips and a pressure bandage was applied.  There was less than 3 cc blood loss. There were no complications.  The patient tolerated the procedure well. She would like to use depo provera. She has used this in the past. Prescription placed. She will schedule nurse appointment for injection tomorrow. Red flag symptoms reviewed.    Follow-up PRN problems.  Doreene BurkeAnnie Sol Odor, CNM

## 2017-06-24 NOTE — Patient Instructions (Signed)

## 2017-06-25 ENCOUNTER — Ambulatory Visit: Payer: Medicaid Other | Admitting: Certified Nurse Midwife

## 2017-06-25 ENCOUNTER — Encounter: Payer: Medicaid Other | Admitting: Family Medicine

## 2017-06-26 ENCOUNTER — Ambulatory Visit: Payer: Medicaid Other

## 2017-07-02 ENCOUNTER — Other Ambulatory Visit: Payer: Self-pay

## 2017-07-02 ENCOUNTER — Telehealth: Payer: Self-pay

## 2017-07-02 ENCOUNTER — Ambulatory Visit (INDEPENDENT_AMBULATORY_CARE_PROVIDER_SITE_OTHER): Payer: Medicaid Other | Admitting: Certified Nurse Midwife

## 2017-07-02 VITALS — BP 108/70 | HR 78 | Ht 59.0 in | Wt 83.7 lb

## 2017-07-02 DIAGNOSIS — Z3042 Encounter for surveillance of injectable contraceptive: Secondary | ICD-10-CM | POA: Diagnosis not present

## 2017-07-02 MED ORDER — MEDROXYPROGESTERONE ACETATE 150 MG/ML IM SUSP: 150.0000 mg | INTRAMUSCULAR | 3 refills | Status: DC

## 2017-07-02 MED ORDER — MEDROXYPROGESTERONE ACETATE 150 MG/ML IM SUSP
150.0000 mg | Freq: Once | INTRAMUSCULAR | Status: AC
  Administered 2017-07-02: 150 mg via INTRAMUSCULAR

## 2017-07-02 NOTE — Telephone Encounter (Signed)
Pt called stating rx for depo-provera not a pharmacy, rx sent.

## 2017-07-02 NOTE — Telephone Encounter (Signed)
Error

## 2017-07-02 NOTE — Patient Instructions (Signed)

## 2017-07-27 ENCOUNTER — Ambulatory Visit: Payer: Self-pay | Admitting: Family Medicine

## 2017-07-28 ENCOUNTER — Ambulatory Visit: Payer: Self-pay | Admitting: Family Medicine

## 2017-09-21 ENCOUNTER — Ambulatory Visit: Payer: Medicaid Other

## 2017-09-28 ENCOUNTER — Ambulatory Visit (INDEPENDENT_AMBULATORY_CARE_PROVIDER_SITE_OTHER): Payer: Medicaid Other | Admitting: Certified Nurse Midwife

## 2017-09-28 ENCOUNTER — Other Ambulatory Visit: Payer: Self-pay

## 2017-09-28 VITALS — BP 120/68 | HR 81 | Wt 87.0 lb

## 2017-09-28 DIAGNOSIS — Z3040 Encounter for surveillance of contraceptives, unspecified: Secondary | ICD-10-CM

## 2017-09-28 LAB — POCT URINE PREGNANCY: Preg Test, Ur: NEGATIVE

## 2017-09-28 MED ORDER — MEDROXYPROGESTERONE ACETATE 150 MG/ML IM SUSP
150.0000 mg | Freq: Once | INTRAMUSCULAR | Status: AC
  Administered 2017-09-28: 150 mg via INTRAMUSCULAR

## 2017-09-28 NOTE — Progress Notes (Signed)
Patient ID: Lori ParrChristina [name], female   DOB: 1991-09-19, 26 y.o.   MRN: [id number]  Pt presents for Depo-Provera injection (given), no c/o side effects.  UPT: negative.

## 2017-11-07 ENCOUNTER — Emergency Department: Payer: Medicaid Other

## 2017-11-07 ENCOUNTER — Emergency Department
Admission: EM | Admit: 2017-11-07 | Discharge: 2017-11-07 | Disposition: A | Discharge status: Home / Self Care | Payer: Medicaid Other | Attending: Emergency Medicine | Admitting: Emergency Medicine

## 2017-11-07 ENCOUNTER — Other Ambulatory Visit: Payer: Self-pay

## 2017-11-07 ENCOUNTER — Encounter: Payer: Self-pay | Admitting: Emergency Medicine

## 2017-11-07 DIAGNOSIS — L02413 Cutaneous abscess of right upper limb: Secondary | ICD-10-CM | POA: Diagnosis not present

## 2017-11-07 DIAGNOSIS — F1990 Other psychoactive substance use, unspecified, uncomplicated: Secondary | ICD-10-CM | POA: Insufficient documentation

## 2017-11-07 DIAGNOSIS — F17210 Nicotine dependence, cigarettes, uncomplicated: Secondary | ICD-10-CM | POA: Diagnosis not present

## 2017-11-07 LAB — CBC WITH DIFFERENTIAL/PLATELET
BASOS ABS: 0 10*3/uL (ref 0–0.1)
Basophils Relative: 1 %
EOS PCT: 4 %
Eosinophils Absolute: 0.3 10*3/uL (ref 0–0.7)
HCT: 33.8 % — ABNORMAL LOW (ref 35.0–47.0)
HEMOGLOBIN: 11.5 g/dL — AB (ref 12.0–16.0)
Lymphocytes Relative: 29 %
Lymphs Abs: 2.5 10*3/uL (ref 1.0–3.6)
MCH: 30.7 pg (ref 26.0–34.0)
MCHC: 34.1 g/dL (ref 32.0–36.0)
MCV: 89.9 fL (ref 80.0–100.0)
Monocytes Absolute: 0.6 10*3/uL (ref 0.2–0.9)
Monocytes Relative: 7 %
NEUTROS ABS: 5.2 10*3/uL (ref 1.4–6.5)
NEUTROS PCT: 59 %
PLATELETS: 263 10*3/uL (ref 150–440)
RBC: 3.76 MIL/uL — AB (ref 3.80–5.20)
RDW: 13.3 % (ref 11.5–14.5)
WBC: 8.6 10*3/uL (ref 3.6–11.0)

## 2017-11-07 LAB — COMPREHENSIVE METABOLIC PANEL
ALK PHOS: 112 U/L (ref 38–126)
ALT: 10 U/L — AB (ref 14–54)
AST: 22 U/L (ref 15–41)
Albumin: 4 g/dL (ref 3.5–5.0)
Anion gap: 7 (ref 5–15)
BUN: 13 mg/dL (ref 6–20)
CALCIUM: 8.9 mg/dL (ref 8.9–10.3)
CHLORIDE: 103 mmol/L (ref 101–111)
CO2: 25 mmol/L (ref 22–32)
CREATININE: 0.58 mg/dL (ref 0.44–1.00)
GFR calc Af Amer: >60 mL/min (ref >60)
Glucose, Bld: 94 mg/dL (ref 65–99)
Potassium: 4 mmol/L (ref 3.5–5.1)
Sodium: 135 mmol/L (ref 135–145)
Total Bilirubin: 0.6 mg/dL (ref 0.3–1.2)
Total Protein: 7.9 g/dL (ref 6.5–8.1)

## 2017-11-07 MED ORDER — IBUPROFEN 800 MG PO TABS: 800.0000 mg | ORAL_TABLET | Freq: Three times a day (TID) | ORAL | 0 refills | Status: DC | PRN

## 2017-11-07 MED ORDER — VANCOMYCIN HCL IN DEXTROSE 1-5 GM/200ML-% IV SOLN
1000.0000 mg | Freq: Once | INTRAVENOUS | Status: AC
  Administered 2017-11-07: 1000 mg via INTRAVENOUS
  Filled 2017-11-07: qty 200

## 2017-11-07 MED ORDER — SODIUM CHLORIDE 0.9 % IV BOLUS (SEPSIS)
1000.0000 mL | Freq: Once | INTRAVENOUS | Status: AC
  Administered 2017-11-07: 1000 mL via INTRAVENOUS

## 2017-11-07 MED ORDER — NICOTINE 14 MG/24HR TD PT24
MEDICATED_PATCH | TRANSDERMAL | Status: AC
  Administered 2017-11-07: 14 mg via TRANSDERMAL
  Filled 2017-11-07: qty 1

## 2017-11-07 MED ORDER — NICOTINE 14 MG/24HR TD PT24
14.0000 mg | MEDICATED_PATCH | Freq: Once | TRANSDERMAL | Status: DC
  Administered 2017-11-07: 14 mg via TRANSDERMAL

## 2017-11-07 MED ORDER — PIPERACILLIN SOD-TAZOBACTAM SO 2.25 (2-0.25) G IV SOLR
Freq: Once | INTRAVENOUS | Status: AC
  Administered 2017-11-07: 16:00:00 via INTRAVENOUS
  Filled 2017-11-07: qty 2.25

## 2017-11-07 MED ORDER — METHYLPREDNISOLONE SODIUM SUCC 125 MG IJ SOLR
125.0000 mg | Freq: Once | INTRAMUSCULAR | Status: AC
  Administered 2017-11-07: 125 mg via INTRAVENOUS

## 2017-11-07 MED ORDER — PIPERACILLIN-TAZOBACTAM IN DEX 2-0.25 GM/50ML IV SOLN: 2.2500 g | Freq: Once | INTRAVENOUS | Status: DC

## 2017-11-07 MED ORDER — DIPHENHYDRAMINE HCL 50 MG/ML IJ SOLN
50.0000 mg | Freq: Once | INTRAMUSCULAR | Status: AC
  Administered 2017-11-07: 50 mg via INTRAVENOUS

## 2017-11-07 MED ORDER — PIPERACILLIN-TAZOBACTAM 3.375 G IVPB
INTRAVENOUS | Status: AC
Start: 2017-11-07 — End: 2017-11-07
  Administered 2017-11-07: 3.375 g
  Filled 2017-11-07: qty 50

## 2017-11-07 MED ORDER — TRAMADOL HCL 50 MG PO TABS
50.0000 mg | ORAL_TABLET | Freq: Once | ORAL | Status: AC
  Administered 2017-11-07: 50 mg via ORAL
  Filled 2017-11-07: qty 1

## 2017-11-07 MED ORDER — NICOTINE 7 MG/24HR TD PT24: 7.0000 mg | MEDICATED_PATCH | Freq: Once | TRANSDERMAL | Status: DC

## 2017-11-07 MED ORDER — METHYLPREDNISOLONE SODIUM SUCC 125 MG IJ SOLR
INTRAMUSCULAR | Status: AC
  Administered 2017-11-07: 125 mg via INTRAVENOUS
  Filled 2017-11-07: qty 2

## 2017-11-07 MED ORDER — DIPHENHYDRAMINE HCL 50 MG/ML IJ SOLN
INTRAMUSCULAR | Status: AC
  Administered 2017-11-07: 50 mg via INTRAVENOUS
  Filled 2017-11-07: qty 1

## 2017-11-07 MED ORDER — LIDOCAINE HCL (PF) 1 % IJ SOLN
5.0000 mL | Freq: Once | INTRAMUSCULAR | Status: AC
  Administered 2017-11-07: 5 mL via INTRADERMAL

## 2017-11-07 MED ORDER — SULFAMETHOXAZOLE-TRIMETHOPRIM 800-160 MG PO TABS: 1.0000 | ORAL_TABLET | Freq: Two times a day (BID) | ORAL | 0 refills | Status: DC

## 2017-11-07 MED ORDER — CEPHALEXIN 500 MG PO CAPS: 500.0000 mg | ORAL_CAPSULE | Freq: Three times a day (TID) | ORAL | 0 refills | Status: AC

## 2017-11-07 MED ORDER — KETOROLAC TROMETHAMINE 30 MG/ML IJ SOLN
30.0000 mg | Freq: Once | INTRAMUSCULAR | Status: AC
  Administered 2017-11-07: 30 mg via INTRAVENOUS
  Filled 2017-11-07: qty 1

## 2017-11-07 MED ORDER — LIDOCAINE HCL (PF) 1 % IJ SOLN
INTRAMUSCULAR | Status: AC
  Administered 2017-11-07: 5 mL via INTRADERMAL
  Filled 2017-11-07: qty 5

## 2017-11-07 NOTE — Discharge Instructions (Signed)
Take the antibiotics as prescribed, return in 2 days for a wound check, or you could see her regular doctor, leave the packing in place for the next 2 days, if you begin to run a fever, or the wound is becoming worse please return to the emergency department

## 2017-11-07 NOTE — ED Notes (Signed)
Pt reports feeling itchy and redness noted to chest and back. Provider at bedside to change pump to 14000mL/hour. RN to bedside to administer medications. Pt remains alert and oriented at this time. No resp distress noted. No decrease on LOC.

## 2017-11-07 NOTE — ED Notes (Signed)
Pt has abscess to right forearm.  No drainage now.  Area red and tender to touch. Pt is iv drug user.

## 2017-11-07 NOTE — ED Notes (Signed)
No redness noted to pts skin and pt reports feeling back to "normal" pt reports drowsiness but also verbalized understanding due to medication. PT Ambulatory at time of discharge.

## 2017-11-07 NOTE — ED Notes (Signed)
Pt is stable at this time. Provider at bedside. Pt remains alert and oriented x 4. No resp changes. Pt denies itchiness and redness has decreased.

## 2017-11-07 NOTE — ED Provider Notes (Signed)
[facility] Emergency Department Provider Note  ____________________________________________   First MD Initiated Contact with Patient 11/07/17 1503     (approximate)  I have reviewed the triage vital signs and the nursing notes.   HISTORY  Chief Complaint Abscess    HPI Lori Pollard is a 26 y.o. female complains of an abscess to the right forearm, states she was injecting IV cocaine, she noticed the red area about a week ago and now has drainage and smells really bad, she denies fever chills, she used to be on Suboxone but ran out of her prescription, she states that when she started to use drugs again, she denies nausea, vomiting, or diarrhea  Past Medical History:  Diagnosis Date  . Cervicalgia   . History of chickenpox   . Hives     Patient Active Problem List   Diagnosis Date Noted  . Bipolar disorder (HCC) 09/18/2015  . PTSD (post-traumatic stress disorder) 09/18/2015  . RAD (reactive airway disease) with wheezing 09/18/2015  . History of narcotic addiction (HCC) 09/18/2015  . Constipation due to pain medication therapy 09/18/2015  . Cervical pain 02/28/2009    Past Surgical History:  Procedure Laterality Date  . CESAREAN SECTION      Prior to Admission medications   Medication Sig Start Date End Date Taking? Authorizing Provider  albuterol (PROVENTIL) (2.5 MG/3ML) 0.083% nebulizer solution Inhale into the lungs. 09/11/14   [provider]  cephALEXin (KEFLEX) 500 MG capsule Take 1 capsule (500 mg total) by mouth 3 (three) times daily for 10 days. 11/07/17 11/17/17  Quanta Robertshaw, Roselyn BeringSusan W, PA-C  divalproex (DEPAKOTE) 250 MG DR tablet Take 1 tablet (250 mg total) by mouth 2 (two) times daily. 09/18/15   Alba CorySowles, Krichna, MD  docusate sodium (COLACE) 100 MG capsule Take by mouth. 09/11/14   [provider]  ibuprofen (ADVIL,MOTRIN) 800 MG tablet Take 1 tablet (800 mg total) by mouth every 8 (eight) hours as needed.  11/07/17   Sherrie MustacheFisher, Roselyn BeringSusan W, PA-C  medroxyPROGESTERone (DEPO-PROVERA) 150 MG/ML injection Inject 1 mL (150 mg total) into the muscle every 3 (three) months. 07/02/17   Doreene Burkehompson, Annie, CNM  SUBOXONE 8-2 MG FILM Place 2.5 Film under the tongue daily as needed. 08/02/15   [provider]  sulfamethoxazole-trimethoprim (BACTRIM DS,SEPTRA DS) 800-160 MG tablet Take 1 tablet by mouth 2 (two) times daily. 11/07/17   Faythe GheeFisher, Kelleigh Skerritt W, PA-C    Allergies Patient has no known allergies.  Family History  Problem Relation Age of Onset  . Alcohol abuse Mother   . GER disease Mother   . Depression Mother   . Alcohol abuse Father   . Emphysema Father     Social History Social History   Tobacco Use  . Smoking status: Current Every Day Smoker    Packs/day: 0.50    Years: 11.00    Pack years: 5.50    Types: Cigarettes    Start date: 09/17/2004  . Smokeless tobacco: Never Used  Substance Use Topics  . Alcohol use: No    Alcohol/week: 0.0 oz  . Drug use: No    Review of Systems  Constitutional: No fever/chills Eyes: No visual changes. ENT: No sore throat. Respiratory: Denies cough Genitourinary: Negative for dysuria. Musculoskeletal: Negative for back pain. Skin: Positive for red swollen area on the right forearm    ____________________________________________   PHYSICAL EXAM:  VITAL SIGNS: ED Triage Vitals  Enc Vitals Group     BP 11/07/17 1435 115/79  Pulse Rate 11/07/17 1435 86     Resp 11/07/17 1435 18     Temp 11/07/17 1435 98.4 F (36.9 C)     Temp Source 11/07/17 1521 Oral     SpO2 11/07/17 1435 100 %     Weight 11/07/17 1436 95 lb (43.1 kg)     Height 11/07/17 1436 4\' 11"  (1.499 m)     Head Circumference --      Peak Flow --      Pain Score 11/07/17 1435 10     Pain Loc --      Pain Edu? --      Excl. in GC? --     Constitutional: Alert and oriented. Well appearing and in no acute distress.  Patient is very thin and pale Eyes: Conjunctivae are  normal.  Head: Atraumatic. Nose: No congestion/rhinnorhea. Mouth/Throat: Mucous membranes are moist.  Throat is normal Cardiovascular: Normal rate, regular rhythm.  Heart sounds are normal Respiratory: Normal respiratory effort.  No retractions, lungs are clear to auscultation GU: deferred Musculoskeletal: FROM all extremities, warm and well perfused Neurologic:  Normal speech and language.  Skin:  Skin is warm, dry , right forearm is positive for a golf ball sized abscess, with a foul odor, there is a large scab at the top with some discharge and swelling, neurovascular is intact psychiatric: Mood and affect are normal. Speech and behavior are normal.  ____________________________________________   LABS (all labs ordered are listed, but only abnormal results are displayed)  Labs Reviewed  CBC WITH DIFFERENTIAL/PLATELET - Abnormal; Notable for the following components:      Result Value   RBC 3.76 (*)    Hemoglobin 11.5 (*)    HCT 33.8 (*)    All other components within normal limits  COMPREHENSIVE METABOLIC PANEL - Abnormal; Notable for the following components:   ALT 10 (*)    All other components within normal limits   ____________________________________________   ____________________________________________  RADIOLOGY  X-ray of the right forearm is negative for foreign body or osteomyelitis  ____________________________________________   PROCEDURES  Procedure(s) performed: IV normal saline, vancomycin, Zosyn, Toradol 30 mg,  INCISION AND DRAINAGE Performed by: Faythe GheeSusan W Dehaven Sine Consent: Verbal consent obtained. Risks and benefits: risks, benefits and alternatives were discussed Type: abscess  Body area: right forearm  Anesthesia: local infiltration  Incision was made with a scalpel. #11 blade  Local anesthetic: lidocaine 1%  Anesthetic total: 5 ml  Complexity: complex Incision and drainage, debrided necrotic tissue  Drainage: purulent  Drainage  amount: 1-2 ml  Packing material: 1/4 in iodoform gauze  Patient tolerance: Patient tolerated the procedure well with no immediate complications.      ____________________________________________   INITIAL IMPRESSION / ASSESSMENT AND PLAN / ED COURSE  Pertinent labs & imaging results that were available during my care of the patient were reviewed by me and considered in my medical decision making (see chart for details).  Patient is a 26 year old IV drug user, with a large abscess on the right forearm, she noticed that it had some drainage and now has a very foul odor, she tried to pop it and got some pus out of it, x-ray of the right forearm is negative for foreign body or osteomyelitis, labs are ordered    ----------------------------------------- 11:26 PM on 11/07/2017 -----------------------------------------  Patient's labs are normal, when discussing antibiotic treatment, Dr. Cyril LoosenKinner recommended vancomycin and Zosyn due to the IV drug use, during the infusion the patient developed "red man syndrome "from  the vancomycin, Solu-Medrol 125 mg IV and Benadryl 50 mg IV was given, the patient tolerated the rest of the procedure well, the rate of the infusion was decreased, the area was I indeed, some of the necrotic tissue was removed, packing and a dressing was applied with, the patient was given a prescription for Septra DS and Keflex, along with ibuprofen, she been given a tramadol while in the emergency department, and I told her he did not want to give her narcotics for pain due to her history of IV drug abuse and addiction, the patient states she understands and will try to get back on her Suboxone, she is to return in 2 days for a recheck of the wound and dressing change, she was informed to return earlier if she begins to have a fever or chills, or if the area is worsening, the patient states she understands will comply with her recommendations, she was discharged in stable  condition   ____________________________________________   FINAL CLINICAL IMPRESSION(S) / ED DIAGNOSES  Final diagnoses:  Abscess of arm, right  IV drug user      NEW MEDICATIONS STARTED DURING THIS VISIT:  This SmartLink is deprecated. Use AVSMEDLIST instead to display the medication list for a patient.   Note:  This document was prepared using Dragon voice recognition software and may include unintentional dictation errors.    Faythe Ghee, PA-C 11/07/17 2329    Nita Sickle, MD 11/08/17 [phone]

## 2017-11-07 NOTE — ED Triage Notes (Signed)
Abscess R forearm x 3 days. Has drained some.

## 2017-11-09 ENCOUNTER — Other Ambulatory Visit: Payer: Self-pay

## 2017-11-09 ENCOUNTER — Emergency Department
Admission: EM | Admit: 2017-11-09 | Discharge: 2017-11-09 | Disposition: A | Discharge status: Home / Self Care | Payer: Medicaid Other | Attending: Emergency Medicine | Admitting: Emergency Medicine

## 2017-11-09 ENCOUNTER — Encounter: Payer: Self-pay | Admitting: Emergency Medicine

## 2017-11-09 DIAGNOSIS — Z5189 Encounter for other specified aftercare: Secondary | ICD-10-CM

## 2017-11-09 DIAGNOSIS — F17210 Nicotine dependence, cigarettes, uncomplicated: Secondary | ICD-10-CM | POA: Insufficient documentation

## 2017-11-09 DIAGNOSIS — L02413 Cutaneous abscess of right upper limb: Secondary | ICD-10-CM | POA: Diagnosis not present

## 2017-11-09 NOTE — ED Provider Notes (Signed)
[facility] Emergency Department Provider Note  ____________________________________________  Time seen: Approximately 7:49 PM  I have reviewed the triage vital signs and the nursing notes.   HISTORY  Chief Complaint Abscess    HPI Lori Pollard is a 26 y.o. female presents emergency department for  wound recheck.  Patient states that abscess looks significantly better than it did 2 days ago.  She is not having any pain over the site.  It no longer smells foul like it used to.  She has been taking antibiotics as prescribed.  No fever, chills, nausea, vomiting, abdominal pain.  Past Medical History:  Diagnosis Date  . Cervicalgia   . History of chickenpox   . Hives     Patient Active Problem List   Diagnosis Date Noted  . Bipolar disorder (HCC) 09/18/2015  . PTSD (post-traumatic stress disorder) 09/18/2015  . RAD (reactive airway disease) with wheezing 09/18/2015  . History of narcotic addiction (HCC) 09/18/2015  . Constipation due to pain medication therapy 09/18/2015  . Cervical pain 02/28/2009    Past Surgical History:  Procedure Laterality Date  . CESAREAN SECTION      Prior to Admission medications   Medication Sig Start Date End Date Taking? Authorizing Provider  albuterol (PROVENTIL) (2.5 MG/3ML) 0.083% nebulizer solution Inhale into the lungs. 09/11/14   [provider]  cephALEXin (KEFLEX) 500 MG capsule Take 1 capsule (500 mg total) by mouth 3 (three) times daily for 10 days. 11/07/17 11/17/17  Fisher, Roselyn BeringSusan W, PA-C  divalproex (DEPAKOTE) 250 MG DR tablet Take 1 tablet (250 mg total) by mouth 2 (two) times daily. 09/18/15   Alba CorySowles, Krichna, MD  docusate sodium (COLACE) 100 MG capsule Take by mouth. 09/11/14   [provider]  ibuprofen (ADVIL,MOTRIN) 800 MG tablet Take 1 tablet (800 mg total) by mouth every 8 (eight) hours as needed. 11/07/17   Sherrie MustacheFisher, Roselyn BeringSusan W, PA-C  medroxyPROGESTERone (DEPO-PROVERA) 150 MG/ML  injection Inject 1 mL (150 mg total) into the muscle every 3 (three) months. 07/02/17   Doreene Burkehompson, Annie, CNM  SUBOXONE 8-2 MG FILM Place 2.5 Film under the tongue daily as needed. 08/02/15   [provider]  sulfamethoxazole-trimethoprim (BACTRIM DS,SEPTRA DS) 800-160 MG tablet Take 1 tablet by mouth 2 (two) times daily. 11/07/17   Faythe GheeFisher, Susan W, PA-C    Allergies Patient has no known allergies.  Family History  Problem Relation Age of Onset  . Alcohol abuse Mother   . GER disease Mother   . Depression Mother   . Alcohol abuse Father   . Emphysema Father     Social History Social History   Tobacco Use  . Smoking status: Current Every Day Smoker    Packs/day: 0.50    Years: 11.00    Pack years: 5.50    Types: Cigarettes    Start date: 09/17/2004  . Smokeless tobacco: Never Used  Substance Use Topics  . Alcohol use: No    Alcohol/week: 0.0 oz  . Drug use: No     Review of Systems  Constitutional: No fever/chills Cardiovascular: No chest pain. Respiratory: No SOB. Gastrointestinal: No abdominal pain.  No nausea, no vomiting.  Musculoskeletal: Negative for musculoskeletal pain. Skin: Negative for rash, abrasions, lacerations, ecchymosis.   ____________________________________________   PHYSICAL EXAM:  VITAL SIGNS: ED Triage Vitals  Enc Vitals Group     BP --      Pulse --      Resp --      Temp --  Temp src --      SpO2 --      Weight 11/09/17 1846 95 lb (43.1 kg)     Height 11/09/17 1846 4\' 11"  (1.499 m)     Head Circumference --      Peak Flow --      Pain Score 11/09/17 1909 10     Pain Loc --      Pain Edu? --      Excl. in GC? --      Constitutional: Alert and oriented. Well appearing and in no acute distress. Eyes: Conjunctivae are normal. PERRL. EOMI. Head: Atraumatic. ENT:      Ears:      Nose: No congestion/rhinnorhea.      Mouth/Throat: Mucous membranes are moist.  Neck: No stridor.  Cardiovascular: Normal rate, regular  rhythm.  Good peripheral circulation. Respiratory: Normal respiratory effort without tachypnea or retractions. Lungs CTAB. Good air entry to the bases with no decreased or absent breath sounds. Musculoskeletal: Full range of motion to all extremities. No gross deformities appreciated. Neurologic:  Normal speech and language. No gross focal neurologic deficits are appreciated.  Skin:  Skin is warm, dry. 1 inch by 1 inch wound to the right forearm. Granulation tissue and wound base is red. No eschar or necrotic tissue. No drainage. Periwound integrity is intact. No surrounding cellulitis. Non tender to palpation.    ____________________________________________   LABS (all labs ordered are listed, but only abnormal results are displayed)  Labs Reviewed - No data to display ____________________________________________  EKG   ____________________________________________  RADIOLOGY   No results found.  ____________________________________________    PROCEDURES  Procedure(s) performed:    Procedures    Medications - No data to display   ____________________________________________   INITIAL IMPRESSION / ASSESSMENT AND PLAN / ED COURSE  Pertinent labs & imaging results that were available during my care of the patient were reviewed by me and considered in my medical decision making (see chart for details).  Review of the [place] CSRS was performed in accordance of the [facility] prior to dispensing any controlled drugs.   Patient presented to the emergency department for abscess recheck.  Vital signs and exam are reassuring.  I visualized wound while patient was in the ED 2 days ago, and wound looks significantly better than it did 2 days ago.  Wound was repacked and dressing was applied.   Patient will continue taking antibiotics.  Patient is to follow up with wound care as directed. Patient is given ED precautions to return to the ED for any worsening or new  symptoms.   ____________________________________________  FINAL CLINICAL IMPRESSION(S) / ED DIAGNOSES  Final diagnoses:  Wound check, abscess      NEW MEDICATIONS STARTED DURING THIS VISIT:  ED Discharge Orders    None          This chart was dictated using voice recognition software/Dragon. Despite best efforts to proofread, errors can occur which can change the meaning. Any change was purely unintentional.    Enid DerryWagner, Therron Sells, PA-C 11/09/17 2009    Merrily Brittleifenbark, Neil, MD 11/09/17 2011

## 2017-11-09 NOTE — ED Triage Notes (Signed)
Pt returning for wound check. I/D right arm abscess 12/19

## 2017-12-14 ENCOUNTER — Ambulatory Visit (INDEPENDENT_AMBULATORY_CARE_PROVIDER_SITE_OTHER): Payer: Medicaid Other | Admitting: Certified Nurse Midwife

## 2017-12-14 VITALS — BP 117/69 | HR 81 | Wt 89.4 lb

## 2017-12-14 DIAGNOSIS — Z3042 Encounter for surveillance of injectable contraceptive: Secondary | ICD-10-CM

## 2017-12-14 DIAGNOSIS — Z3040 Encounter for surveillance of contraceptives, unspecified: Secondary | ICD-10-CM

## 2017-12-14 MED ORDER — MEDROXYPROGESTERONE ACETATE 150 MG/ML IM SUSP
150.0000 mg | Freq: Once | INTRAMUSCULAR | Status: AC
  Administered 2017-12-14: 150 mg via INTRAMUSCULAR

## 2017-12-14 NOTE — Progress Notes (Signed)
Last depo inj:09/28/17 UPT:n/a Side effects:none Next Depo- Provera injection due: 4/22-03/15/18 Annual exam due:

## 2018-03-01 ENCOUNTER — Ambulatory Visit: Payer: Medicaid Other

## 2018-03-05 ENCOUNTER — Ambulatory Visit: Payer: Medicaid Other

## 2018-05-11 ENCOUNTER — Encounter: Payer: Self-pay | Admitting: Emergency Medicine

## 2018-05-11 ENCOUNTER — Emergency Department
Admission: EM | Admit: 2018-05-11 | Discharge: 2018-05-11 | Disposition: A | Discharge status: Home / Self Care | Payer: Medicaid Other | Attending: Emergency Medicine | Admitting: Emergency Medicine

## 2018-05-11 DIAGNOSIS — F1910 Other psychoactive substance abuse, uncomplicated: Secondary | ICD-10-CM | POA: Insufficient documentation

## 2018-05-11 DIAGNOSIS — F17210 Nicotine dependence, cigarettes, uncomplicated: Secondary | ICD-10-CM | POA: Diagnosis not present

## 2018-05-11 DIAGNOSIS — Z0489 Encounter for examination and observation for other specified reasons: Secondary | ICD-10-CM | POA: Insufficient documentation

## 2018-05-11 LAB — POCT PREGNANCY, URINE: Preg Test, Ur: NEGATIVE

## 2018-05-11 NOTE — ED Notes (Signed)
Pt discharged to lobby. Pt given resources from TTS regarding treatment options. Discharge paperwork reviewed with patient. Pt signed for discharge. All belongings returned to patient. Denies SI/H.

## 2018-05-11 NOTE — ED Provider Notes (Signed)
[facility] Emergency Department Provider Note       Time seen: ----------------------------------------- 9:37 AM on 05/11/2018 -----------------------------------------   I have reviewed the triage vital signs and the nursing notes.  HISTORY   Chief Complaint detox    HPI Lori Pollard is a 27 y.o. female with a history of bipolar disorder, PTSD, narcotic addiction who presents to the ED for request for detox from alcohol, cocaine and she also wants to get off Suboxone.  She is also not sure if she is pregnant, last drink was this morning at 4 AM.  She denies suicidal homicidal ideation.  Past Medical History:  Diagnosis Date  . Cervicalgia   . History of chickenpox   . Hives     Patient Active Problem List   Diagnosis Date Noted  . Bipolar disorder (HCC) 09/18/2015  . PTSD (post-traumatic stress disorder) 09/18/2015  . RAD (reactive airway disease) with wheezing 09/18/2015  . History of narcotic addiction (HCC) 09/18/2015  . Constipation due to pain medication therapy 09/18/2015  . Cervical pain 02/28/2009    Past Surgical History:  Procedure Laterality Date  . CESAREAN SECTION      Allergies Patient has no known allergies.  Social History Social History   Tobacco Use  . Smoking status: Current Every Day Smoker    Packs/day: 0.50    Years: 11.00    Pack years: 5.50    Types: Cigarettes    Start date: 09/17/2004  . Smokeless tobacco: Never Used  Substance Use Topics  . Alcohol use: No    Alcohol/week: 0.0 oz  . Drug use: No   Review of Systems Constitutional: Negative for fever. Cardiovascular: Negative for chest pain. Respiratory: Negative for shortness of breath. Gastrointestinal: Negative for abdominal pain, vomiting and diarrhea. Musculoskeletal: Negative for back pain. Skin: Negative for rash. Neurological: Negative for headaches, focal weakness or numbness. Psychiatric: Denies suicidal or homicidal  ideation  All systems negative/normal/unremarkable except as stated in the HPI  ____________________________________________   PHYSICAL EXAM:  VITAL SIGNS: ED Triage Vitals [05/11/18 0931]  Enc Vitals Group     BP 110/81     Pulse Rate (!) 116     Resp 18     Temp 98 F (36.7 C)     Temp Source Oral     SpO2 100 %     Weight 90 lb (40.8 kg)     Height 4\' 11"  (1.499 m)     Head Circumference      Peak Flow      Pain Score 0     Pain Loc      Pain Edu?      Excl. in GC?    Constitutional: Alert and oriented. Well appearing and in no distress. Eyes: Conjunctivae are normal. Normal extraocular movements. Cardiovascular: Normal rate, regular rhythm. No murmurs, rubs, or gallops. Respiratory: Normal respiratory effort without tachypnea nor retractions. Breath sounds are clear and equal bilaterally. No wheezes/rales/rhonchi. Gastrointestinal: Soft and nontender. Normal bowel sounds Musculoskeletal: Nontender with normal range of motion in extremities. No lower extremity tenderness nor edema. Neurologic:  Normal speech and language. No gross focal neurologic deficits are appreciated.  Skin:  Skin is warm, dry and intact. No rash noted. Psychiatric: Mood and affect are normal. Speech and behavior are normal.  ____________________________________________  ED COURSE:  As part of my medical decision making, I reviewed the following data within the electronic MEDICAL RECORD NUMBER History obtained from family if available, nursing notes, old  chart and ekg, as well as notes from prior ED visits. Patient presented for detox request, we will assess with labs and refer to outpatient detox   Procedures ____________________________________________   LABS (pertinent positives/negatives)  Labs Reviewed - No data to display  ____________________________________________  DIFFERENTIAL DIAGNOSIS   Polysubstance abuse  FINAL ASSESSMENT AND PLAN  Polysubstance abuse   Plan: The patient  had presented for detox referral.  She appears medically cleared for outpatient detox referral   Ulice DashJohnathan E Fradel Baldonado, MD   Note: This note was generated in part or whole with voice recognition software. Voice recognition is usually quite accurate but there are transcription errors that can and very often do occur. I apologize for any typographical errors that were not detected and corrected.     Emily FilbertWilliams, Dionisio Aragones E, MD 05/11/18 [phone]

## 2018-05-11 NOTE — ED Notes (Signed)
Pt to be discharged home with resources given by TTS representative.   Maintained on 15 minute checks and observation by security camera for safety.

## 2018-05-11 NOTE — ED Notes (Signed)
Pt calm and cooperative. Requesting help with detox from alcohol and cocaine. Denies SI/HI. Pt stated her last drink was at 4am this morning.   Maintained on 15 minute checks and observation by security camera for safety.

## 2018-05-11 NOTE — ED Triage Notes (Signed)
Pt arrived voluntarily with request to be admitted for detox. Pt reports being an alcoholic for a year. Pt's last drink this morning at 0400. Pt denies SI/HI

## 2018-05-11 NOTE — BH Assessment (Signed)
This Clinical research associatewriter provided patient with SA resources per ED Dr. Mayford KnifeWilliams request.

## 2019-06-16 ENCOUNTER — Other Ambulatory Visit: Payer: Self-pay

## 2019-06-16 ENCOUNTER — Ambulatory Visit: Payer: Medicaid Other

## 2019-06-16 VITALS — BP 96/63 | Ht <= 58 in | Wt 87.5 lb

## 2019-06-16 DIAGNOSIS — Z3201 Encounter for pregnancy test, result positive: Secondary | ICD-10-CM

## 2019-06-16 MED ORDER — PRENATAL VITAMINS 28-0.8 MG PO TABS: 28.0000 mg | ORAL_TABLET | Freq: Every day | ORAL | 0 refills | Status: AC

## 2019-06-16 NOTE — Progress Notes (Signed)
Patient states she is still having periods. Reports had period 2 weeks ago. "I need to know if I am pregnant because I am on Suboxone". Patient has appt with [facility] on 06/21/19. Aileen Fass, RN

## 2019-06-17 LAB — PREGNANCY, URINE: Preg Test, Ur: POSITIVE — AB

## 2019-06-21 ENCOUNTER — Encounter: Payer: Self-pay | Admitting: Obstetrics and Gynecology

## 2019-06-29 ENCOUNTER — Other Ambulatory Visit: Payer: Self-pay

## 2019-06-29 ENCOUNTER — Emergency Department
Admission: EM | Admit: 2019-06-29 | Discharge: 2019-06-29 | Disposition: A | Discharge status: Home / Self Care | Payer: Medicaid Other | Attending: Emergency Medicine | Admitting: Emergency Medicine

## 2019-06-29 DIAGNOSIS — R109 Unspecified abdominal pain: Secondary | ICD-10-CM | POA: Insufficient documentation

## 2019-06-29 DIAGNOSIS — Z5321 Procedure and treatment not carried out due to patient leaving prior to being seen by health care provider: Secondary | ICD-10-CM | POA: Insufficient documentation

## 2019-06-29 LAB — CBC
HCT: 28.2 % — ABNORMAL LOW (ref 36.0–46.0)
Hemoglobin: 9.4 g/dL — ABNORMAL LOW (ref 12.0–15.0)
MCH: 32.3 pg (ref 26.0–34.0)
MCHC: 33.3 g/dL (ref 30.0–36.0)
MCV: 96.9 fL (ref 80.0–100.0)
Platelets: 167 10*3/uL (ref 150–400)
RBC: 2.91 MIL/uL — ABNORMAL LOW (ref 3.87–5.11)
RDW: 13.2 % (ref 11.5–15.5)
WBC: 13 10*3/uL — ABNORMAL HIGH (ref 4.0–10.5)
nRBC: 0 % (ref 0.0–0.2)

## 2019-06-29 LAB — COMPREHENSIVE METABOLIC PANEL
ALT: 12 U/L (ref 0–44)
AST: 16 U/L (ref 15–41)
Albumin: 3.1 g/dL — ABNORMAL LOW (ref 3.5–5.0)
Alkaline Phosphatase: 107 U/L (ref 38–126)
Anion gap: 10 (ref 5–15)
BUN: <5 mg/dL — ABNORMAL LOW (ref 6–20)
CO2: 20 mmol/L — ABNORMAL LOW (ref 22–32)
Calcium: 8.4 mg/dL — ABNORMAL LOW (ref 8.9–10.3)
Chloride: 100 mmol/L (ref 98–111)
Creatinine, Ser: 0.4 mg/dL — ABNORMAL LOW (ref 0.44–1.00)
GFR calc Af Amer: >60 mL/min (ref >60)
GFR calc non Af Amer: >60 mL/min (ref >60)
Glucose, Bld: 103 mg/dL — ABNORMAL HIGH (ref 70–99)
Potassium: 3.3 mmol/L — ABNORMAL LOW (ref 3.5–5.1)
Sodium: 130 mmol/L — ABNORMAL LOW (ref 135–145)
Total Bilirubin: 0.4 mg/dL (ref 0.3–1.2)
Total Protein: 7 g/dL (ref 6.5–8.1)

## 2019-06-29 LAB — HCG, QUANTITATIVE, PREGNANCY: hCG, Beta Chain, Quant, S: 21429 m[IU]/mL — ABNORMAL HIGH (ref <5)

## 2019-06-29 NOTE — ED Notes (Signed)
Fetal HR: 176 bpm

## 2019-06-29 NOTE — ED Notes (Signed)
Pt st leaving and going to [facility]

## 2019-06-29 NOTE — ED Triage Notes (Addendum)
Pt arrives to ED via POV from home with c/o abdominal pain x2 days. Pt reports she is pregnant, but unsure how far along she is, "but maybe like 3-4 months". Pt reports regular monthly vaginal bleeding so she states she didn't know she was pregnant and thought she was having regular periods. Pt (+) nausea, but denies vomiting or diarrhea. No c/o CP or SHOB.

## 2019-06-30 LAB — ABO/RH: ABO/RH(D): O POS

## 2019-07-01 ENCOUNTER — Encounter: Payer: Self-pay | Admitting: Maternal Newborn

## 2019-07-08 ENCOUNTER — Encounter: Payer: Self-pay | Admitting: Maternal Newborn

## 2019-07-14 ENCOUNTER — Ambulatory Visit (INDEPENDENT_AMBULATORY_CARE_PROVIDER_SITE_OTHER): Payer: Medicaid Other | Admitting: Obstetrics and Gynecology

## 2019-07-14 ENCOUNTER — Other Ambulatory Visit: Payer: Self-pay

## 2019-07-14 ENCOUNTER — Other Ambulatory Visit (HOSPITAL_COMMUNITY)
Admission: RE | Admit: 2019-07-14 | Discharge: 2019-07-14 | Disposition: A | Discharge status: Home / Self Care | Payer: Medicaid Other | Source: Ambulatory Visit | Attending: Obstetrics and Gynecology | Admitting: Obstetrics and Gynecology

## 2019-07-14 ENCOUNTER — Encounter: Payer: Self-pay | Admitting: Obstetrics and Gynecology

## 2019-07-14 VITALS — BP 92/62 | Wt 95.0 lb

## 2019-07-14 DIAGNOSIS — Z3A28 28 weeks gestation of pregnancy: Secondary | ICD-10-CM

## 2019-07-14 DIAGNOSIS — O09A2 Supervision of pregnancy with history of molar pregnancy, second trimester: Secondary | ICD-10-CM | POA: Insufficient documentation

## 2019-07-14 DIAGNOSIS — O0933 Supervision of pregnancy with insufficient antenatal care, third trimester: Secondary | ICD-10-CM

## 2019-07-14 DIAGNOSIS — O99013 Anemia complicating pregnancy, third trimester: Secondary | ICD-10-CM

## 2019-07-14 DIAGNOSIS — O99012 Anemia complicating pregnancy, second trimester: Secondary | ICD-10-CM | POA: Insufficient documentation

## 2019-07-14 DIAGNOSIS — O34219 Maternal care for unspecified type scar from previous cesarean delivery: Secondary | ICD-10-CM

## 2019-07-14 DIAGNOSIS — Z98891 History of uterine scar from previous surgery: Secondary | ICD-10-CM | POA: Insufficient documentation

## 2019-07-14 DIAGNOSIS — O0932 Supervision of pregnancy with insufficient antenatal care, second trimester: Secondary | ICD-10-CM | POA: Insufficient documentation

## 2019-07-14 DIAGNOSIS — O09A3 Supervision of pregnancy with history of molar pregnancy, third trimester: Secondary | ICD-10-CM

## 2019-07-14 DIAGNOSIS — O99323 Drug use complicating pregnancy, third trimester: Secondary | ICD-10-CM

## 2019-07-14 DIAGNOSIS — F1120 Opioid dependence, uncomplicated: Secondary | ICD-10-CM | POA: Insufficient documentation

## 2019-07-14 DIAGNOSIS — Z3A20 20 weeks gestation of pregnancy: Secondary | ICD-10-CM

## 2019-07-14 NOTE — Progress Notes (Signed)
New Obstetric Patient H&P   Chief Complaint: "Desires prenatal care"   History of Present Illness: Patient is a 28 y.o. [id number]  Not Hispanic or Latino female, LMP 12/24/2018 presents with amenorrhea and positive home pregnancy test. Based on her  LMP, her EDD is Estimated Date of Delivery: 09/30/19 and her EGA is [redacted]w[redacted]d. Cycles irregular.  She had some bleeding about 1.5 months ago. Her last pap smear was 1 years ago and was no abnormalities, per patient report.    She had a urine pregnancy test which was positive 6 week(s)  ago. Since her LMP she claims she has experienced none. She had vaginal bleeding about 6 weeks ago. Her past medical history is noncontributory. Her prior pregnancies are notable for no issues. She had c-section deliveries with both prior pregnancies.    Since her LMP, she admits to the use of tobacco products  Yes - about 2 cigs/day.  She claims she has gained 8 pounds since the start of her pregnancy.  There are cats in the home in the home  no  She admits close contact with children on a regular basis  yes  She has had chicken pox in the past yes She has had Tuberculosis exposures, symptoms, or previously tested positive for TB   no Current or past history of domestic violence. no  Genetic Screening/Teratology Counseling: (Includes patient, baby's father, or anyone in either family with:)   40. Patient's age >/= 79 [facility]  no 2. Thalassemia ([place], [place], [place], or Asian background): MCV<80  no 3. Neural tube defect (meningomyelocele, spina bifida, anencephaly)  no 4. Congenital heart defect  no  5. Down syndrome  no 6. Tay-Sachs (Jewish, [place])  no 7. Canavan's Disease  no 8. Sickle cell disease or trait (African)  no  9. Hemophilia or other blood disorders  no  10. Muscular dystrophy  no  11. Cystic fibrosis  no  12. Huntington's Chorea  no  13. Mental retardation/autism  no 14. Other inherited genetic or chromosomal disorder  no 15.  Maternal metabolic disorder (DM, PKU, etc)  no 16. Patient or FOB with a child with a birth defect not listed above no  16a. Patient or FOB with a birth defect themselves no 17. Recurrent pregnancy loss, or stillbirth  no  18. Any medications since LMP other than prenatal vitamins (include vitamins, supplements, OTC meds, drugs, alcohol)  Suboxone pill (20 mg a day), PNV.  Suboxone from Dr. Collie Siad at [facility] in [place], [place]. 19. Any other genetic/environmental exposure to discuss  no  Infection History:   1. Lives with someone with TB or TB exposed  no  2. Patient or partner has history of genital herpes  no 3. Rash or viral illness since LMP  no 4. History of STI (GC, CT, HPV, syphilis, HIV)  no 5. History of recent travel :  no  Other pertinent information:  none     Review of Systems:10 point review of systems negative unless otherwise noted in HPI  Past Medical History:  Diagnosis Date  . Cervicalgia   . History of chickenpox   . Hives     Past Surgical History:  Procedure Laterality Date  . CESAREAN SECTION  2013  . CESAREAN SECTION  2015    Gynecologic History: Patient's last menstrual period was 12/24/2018 (lmp unknown).  Obstetric History: [id number]  Family History  Problem Relation Age of Onset  . Alcohol abuse Mother   . GER disease Mother   .  Depression Mother   . Alcohol abuse Father   . Emphysema Father   . Diabetes Maternal Grandmother   . Heart disease Maternal Grandmother   . Hyperlipidemia Maternal Grandmother   . Hypertension Maternal Grandmother     Social History   Socioeconomic History  . Marital status: Single    Spouse name: Not on file  . Number of children: Not on file  . Years of education: Not on file  . Highest education level: Not on file  Occupational History  . Not on file  Social Needs  . Financial resource strain: Not on file  . Food insecurity    Worry: Not on file    Inability: Not on file  . Transportation  needs    Medical: Not on file    Non-medical: Not on file  Tobacco Use  . Smoking status: Current Every Day Smoker    Packs/day: 0.50    Years: 11.00    Pack years: 5.50    Types: Cigarettes    Start date: 09/17/2004  . Smokeless tobacco: Never Used  Substance and Sexual Activity  . Alcohol use: No    Alcohol/week: 0.0 standard drinks  . Drug use: No  . Sexual activity: Yes    Partners: Male    Birth control/protection: None    Comment: Depo Injections began 07/02/17  Lifestyle  . Physical activity    Days per week: Not on file    Minutes per session: Not on file  . Stress: Not on file  Relationships  . Social Musicianconnections    Talks on phone: Not on file    Gets together: Not on file    Attends religious service: Not on file    Active member of club or organization: Not on file    Attends meetings of clubs or organizations: Not on file    Relationship status: Not on file  . Intimate partner violence    Fear of current or ex partner: No    Emotionally abused: No    Physically abused: No    Forced sexual activity: No  Other Topics Concern  . Not on file  Social History Narrative  . Not on file    No Known Allergies  Prior to Admission medications   Medication Sig Start Date End Date Taking? Authorizing Provider  albuterol (PROVENTIL) (2.5 MG/3ML) 0.083% nebulizer solution Inhale into the lungs. 09/11/14  Yes [provider]  Prenatal Vit-Fe Fumarate-FA (PRENATAL VITAMINS) 28-0.8 MG TABS Take 28 mg by mouth daily. 06/16/19 09/14/19 Yes Federico FlakeNewton, Kimberly Niles, MD  SUBOXONE 8-2 MG FILM Place 2.5 Film under the tongue daily as needed. 08/02/15   [provider]    Physical Exam BP 92/62   Wt 95 lb (43.1 kg)   LMP 12/24/2018 (LMP Unknown)   BMI 19.19 kg/m   Physical Exam Constitutional:      General: She is not in acute distress.    Appearance: Normal appearance. She is well-developed.  Genitourinary:     Pelvic exam was performed with patient in the  lithotomy position.     Vulva, inguinal canal, urethra, bladder, vagina, right adnexa and left adnexa normal.     No posterior fourchette tenderness, injury or lesion present.     No cervical friability, lesion, bleeding or polyp.     Uterus is enlarged.     Genitourinary Comments: Exam limited by uterus  HENT:     Head: Normocephalic and atraumatic.  Eyes:     General:  No scleral icterus.    Conjunctiva/sclera: Conjunctivae normal.  Neck:     Musculoskeletal: Normal range of motion and neck supple.  Cardiovascular:     Rate and Rhythm: Normal rate and regular rhythm.     Heart sounds: No murmur. No friction rub. No gallop.   Pulmonary:     Effort: Pulmonary effort is normal. No respiratory distress.     Breath sounds: Normal breath sounds. No wheezing or rales.  Abdominal:     General: Bowel sounds are normal. There is no distension.     Palpations: Abdomen is soft. There is mass (uterine fundus at just abov the umbilicus).     Tenderness: There is no abdominal tenderness. There is no guarding or rebound.  Musculoskeletal: Normal range of motion.  Neurological:     General: No focal deficit present.     Mental Status: She is alert and oriented to person, place, and time.     Cranial Nerves: No cranial nerve deficit.  Skin:    General: Skin is warm and dry.     Findings: No erythema.  Psychiatric:        Mood and Affect: Mood normal.        Behavior: Behavior normal.        Judgment: Judgment normal.     Female Chaperone present during breast and/or pelvic exam.   Assessment: 28 y.o. G3P2002 at [redacted]w[redacted]d presenting to initiate prenatal care  Plan: 1) Avoid alcoholic beverages. 2) Patient encouraged not to smoke.  3) Discontinue the use of all non-medicinal drugs and chemicals.  4) Take prenatal vitamins daily.  5) Nutrition, food safety (fish, cheese advisories, and high nitrite foods) and exercise discussed. 6) Hospital and practice style discussed with cross coverage  system.  7) Genetic Screening, such as with 1st Trimester Screening, cell free fetal DNA, AFP testing, and Ultrasound, as well as with amniocentesis and CVS as appropriate, is discussed with patient. At the conclusion of today's visit patient undecided genetic testing 8) Patient is asked about travel to areas at risk for the [place] virus, and counseled to avoid travel and exposure to mosquitoes or sexual partners who may have themselves been exposed to the virus. Testing is discussed, and will be ordered as appropriate.  9) U/S ASAP for dating/anatomy 10) Discussed suboxone use in pregnancy and possible NAS with infant.   Thomasene Mohair, MD 07/14/2019 3:25 PM

## 2019-07-14 NOTE — Progress Notes (Signed)
NOB LMP unknown stopped Depo in February

## 2019-07-15 LAB — RPR+RH+ABO+RUB AB+AB SCR+CB...
Antibody Screen: NEGATIVE
HIV Screen 4th Generation wRfx: NONREACTIVE
Hematocrit: 27.3 % — ABNORMAL LOW (ref 34.0–46.6)
Hemoglobin: 9.1 g/dL — ABNORMAL LOW (ref 11.1–15.9)
Hepatitis B Surface Ag: NEGATIVE
MCH: 31.1 pg (ref 26.6–33.0)
MCHC: 33.3 g/dL (ref 31.5–35.7)
MCV: 93 fL (ref 79–97)
Platelets: 357 10*3/uL (ref 150–450)
RBC: 2.93 x10E6/uL — ABNORMAL LOW (ref 3.77–5.28)
RDW: 12.4 % (ref 11.7–15.4)
RPR Ser Ql: NONREACTIVE
Rh Factor: POSITIVE
Rubella Antibodies, IGG: 1.61 index (ref >0.99)
Varicella zoster IgG: 802 index (ref >165)
WBC: 7.8 10*3/uL (ref 3.4–10.8)

## 2019-07-18 LAB — URINE DRUG PANEL 7
Amphetamines, Urine: NEGATIVE ng/mL
Barbiturate Quant, Ur: NEGATIVE ng/mL
Benzodiazepine Quant, Ur: NEGATIVE ng/mL
Cannabinoid Quant, Ur: NEGATIVE ng/mL
Cocaine (Metab.): POSITIVE — AB
Opiate Quant, Ur: NEGATIVE ng/mL
PCP Quant, Ur: NEGATIVE ng/mL

## 2019-07-18 LAB — SPECIMEN STATUS REPORT

## 2019-07-19 LAB — CYTOLOGY - PAP
Chlamydia: NEGATIVE
Diagnosis: NEGATIVE
Neisseria Gonorrhea: NEGATIVE
Trichomonas: NEGATIVE

## 2019-07-29 ENCOUNTER — Other Ambulatory Visit: Payer: Medicaid Other

## 2019-07-29 ENCOUNTER — Encounter: Payer: Medicaid Other | Admitting: Obstetrics and Gynecology

## 2019-08-05 ENCOUNTER — Other Ambulatory Visit: Payer: Self-pay

## 2019-08-05 ENCOUNTER — Encounter: Payer: Medicaid Other | Admitting: Obstetrics and Gynecology

## 2019-08-05 ENCOUNTER — Ambulatory Visit (INDEPENDENT_AMBULATORY_CARE_PROVIDER_SITE_OTHER): Payer: Medicaid Other

## 2019-08-05 ENCOUNTER — Ambulatory Visit (INDEPENDENT_AMBULATORY_CARE_PROVIDER_SITE_OTHER): Payer: Medicaid Other | Admitting: Obstetrics and Gynecology

## 2019-08-05 VITALS — BP 94/48 | Wt 95.0 lb

## 2019-08-05 DIAGNOSIS — O0933 Supervision of pregnancy with insufficient antenatal care, third trimester: Secondary | ICD-10-CM

## 2019-08-05 DIAGNOSIS — O99012 Anemia complicating pregnancy, second trimester: Secondary | ICD-10-CM

## 2019-08-05 DIAGNOSIS — O0932 Supervision of pregnancy with insufficient antenatal care, second trimester: Secondary | ICD-10-CM

## 2019-08-05 DIAGNOSIS — Z363 Encounter for antenatal screening for malformations: Secondary | ICD-10-CM | POA: Diagnosis not present

## 2019-08-05 DIAGNOSIS — O34219 Maternal care for unspecified type scar from previous cesarean delivery: Secondary | ICD-10-CM

## 2019-08-05 DIAGNOSIS — O99013 Anemia complicating pregnancy, third trimester: Secondary | ICD-10-CM

## 2019-08-05 DIAGNOSIS — Z98891 History of uterine scar from previous surgery: Secondary | ICD-10-CM

## 2019-08-05 DIAGNOSIS — O09A2 Supervision of pregnancy with history of molar pregnancy, second trimester: Secondary | ICD-10-CM

## 2019-08-05 DIAGNOSIS — O99323 Drug use complicating pregnancy, third trimester: Secondary | ICD-10-CM

## 2019-08-05 DIAGNOSIS — Z3A32 32 weeks gestation of pregnancy: Secondary | ICD-10-CM

## 2019-08-05 DIAGNOSIS — F1120 Opioid dependence, uncomplicated: Secondary | ICD-10-CM

## 2019-08-05 DIAGNOSIS — O99320 Drug use complicating pregnancy, unspecified trimester: Secondary | ICD-10-CM

## 2019-08-05 NOTE — Progress Notes (Signed)
Routine Prenatal Care Visit  Subjective  Lori Pollard is a 28 y.o. [phone] at [redacted]w[redacted]d being seen today for ongoing prenatal care.  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  ----------------------------------------------------------------------------------- Patient reports no complaints.   Contractions: Not present. Vag. Bleeding: None.  Movement: Present. Denies leaking of fluid.  ----------------------------------------------------------------------------------- The following portions of the patient's history were reviewed and updated as appropriate: allergies, current medications, past family history, past medical history, past social history, past surgical history and problem list. Problem list updated.   Objective  Blood pressure (!) 94/48, weight 95 lb (43.1 kg), last menstrual period 12/24/2018. Pregravid weight 90 lb (40.8 kg) Total Weight Gain 5 lb (2.268 kg) Urinalysis:      Fetal Status: Fetal Heart Rate (bpm): 140   Movement: Present     General:  Alert, oriented and cooperative. Patient is in no acute distress.  Skin: Skin is warm and dry. No rash noted.   Cardiovascular: Normal heart rate noted  Respiratory: Normal respiratory effort, no problems with respiration noted  Abdomen: Soft, gravid, appropriate for gestational age. Pain/Pressure: Absent     Pelvic:  Cervical exam deferred         Extremities: Normal range of motion.     ental Status: Normal Pollard and affect. Normal behavior. Normal judgment and thought content.   US Ob Comp + 14 Wk  Result Date: 08/05/2019 Patient Name: Lori Pollard DOB: 1991-08-17 MRN: [id number] ULTRASOUND REPORT Location: [facility] Date of Service: 08/05/2019 Indications:Anatomy Ultrasound Findings: Lori Pollard intrauterine pregnancy is visualized with FHR at 150 BPM. Biometrics give an (U/S) Gestational age of [redacted]w[redacted]d and an (U/S) EDD of 12/06/2019; this does not correlate with the clinically established Estimated Date of Delivery: 09/30/19 Fetal presentation is Cephalic. EFW: 433 g ( 15 oz ). Placenta: posterior. Grade: 1 AFI: subjectively normal. Anatomic survey is complete and normal; Gender - female.  Impression: 1. [redacted]w[redacted]d Viable Singleton Intrauterine pregnancy by U/S. 2. (U/S) EDD is not consistent with Clinically established Estimated Date of Delivery: 09/30/19 . 3. Normal Anatomy Scan Recommendations: 1.Clinical correlation with the patient's History and Physical Exam. Lori Pollard, RT  There is a singleton gestation with subjectively normal amniotic fluid volume. The fetal biometry correlates with established dating. Detailed evaluation of the fetal anatomy was performed.The fetal anatomical survey appears within normal limits within the resolution of ultrasound as described above.  It must be noted that a normal ultrasound is unable to rule out fetal aneuploidy, subtle defects such as small ASD or VDS may also not be visible on imaging.  Lori Mood, MD, [facility], [facility] 08/05/2019, 11:40 AM     Assessment   28 y.o. 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 [place], [place] [address]  [phone])   Genetic Screen 1 Screen:    AFP:     Quad:     NIPS: Antibody:   Anatomic [place]  Rubella:   Varicella: @VZVIGG @  GTT Early:               Third trimester:  RPR:     Rhogam  HBsAg:     TDaP vaccine                       Flu Shot: HIV:     Baby Food                                GBS:   Contraception  Pap:  CBB     CS/VBAC    Support Person            History of cesarean delivery 07/14/2019 by 09/13/2019, MD No   Overview Signed 07/14/2019  4:02 PM by 09/13/2019, MD    H/o c-section x 2      Suboxone maintenance treatment complicating pregnancy, antepartum ([facility]CC) 07/14/2019 by 09/13/2019, MD No   Overview Signed 07/14/2019  4:03 PM by 09/13/2019, MD    suboxone 20 mg daily. See NOB note from 9/3 for provider and location of Rx      Late prenatal care affecting pregnancy in second trimester 07/14/2019 by 09/13/2019, MD No   Anemia during pregnancy in second trimester 07/14/2019 by 09/13/2019, MD No   Overview Signed 07/14/2019  4:03 PM by 09/13/2019, MD    hgb 9.3 prior to NOB appt          Gestational age appropriate obstetric precautions including but not limited to vaginal bleeding, contractions, leaking of fluid and fetal movement were reviewed in detail with the patient.    - redated based on ultrasound today  Return in about 4 weeks (around 09/02/2019) for RPB.  09/04/2019, MD, [facility], [facility] 08/05/2019, 6:38 PM

## 2019-08-05 NOTE — Progress Notes (Signed)
ROB Anatomy scan/ It is a girl 

## 2019-08-08 ENCOUNTER — Telehealth: Payer: Self-pay

## 2019-08-08 NOTE — Telephone Encounter (Signed)
Pt calling after hour nurse 9/27 at 5:39pm clo poss yeast inf - white d/c and vag itching.  Pt is [redacted]w[redacted]d, no fever, no abd pain, no other sxs. Pt was adv by after hour nurse to use 7d monistat, gynelotrimin, mycelex or femstat [phone] Left detailed msg calling to f/u; we prefer she use monistat 3d or 7d; if after finishing monistat still has sxs to call to be seen and not to use medication night appt as that will be all that will be seen under microscope.  To call and let me know how she is.

## 2019-08-12 NOTE — Telephone Encounter (Signed)
Mailbox is full.

## 2019-08-16 NOTE — Telephone Encounter (Signed)
Mailbox is full.

## 2019-08-19 NOTE — Telephone Encounter (Signed)
Pt has appt 10/23; hasn't called since; msg closed.

## 2019-09-02 ENCOUNTER — Encounter: Payer: Medicaid Other | Admitting: Certified Nurse Midwife

## 2019-09-07 ENCOUNTER — Ambulatory Visit (INDEPENDENT_AMBULATORY_CARE_PROVIDER_SITE_OTHER): Payer: Medicaid Other | Admitting: Obstetrics and Gynecology

## 2019-09-07 ENCOUNTER — Other Ambulatory Visit: Payer: Self-pay

## 2019-09-07 ENCOUNTER — Encounter: Payer: Self-pay | Admitting: Obstetrics and Gynecology

## 2019-09-07 VITALS — BP 97/65 | Wt 96.0 lb

## 2019-09-07 DIAGNOSIS — Z3A26 26 weeks gestation of pregnancy: Secondary | ICD-10-CM

## 2019-09-07 DIAGNOSIS — Z131 Encounter for screening for diabetes mellitus: Secondary | ICD-10-CM

## 2019-09-07 DIAGNOSIS — F1120 Opioid dependence, uncomplicated: Secondary | ICD-10-CM

## 2019-09-07 DIAGNOSIS — O09A2 Supervision of pregnancy with history of molar pregnancy, second trimester: Secondary | ICD-10-CM

## 2019-09-07 DIAGNOSIS — Z98891 History of uterine scar from previous surgery: Secondary | ICD-10-CM

## 2019-09-07 DIAGNOSIS — O99322 Drug use complicating pregnancy, second trimester: Secondary | ICD-10-CM

## 2019-09-07 DIAGNOSIS — O0932 Supervision of pregnancy with insufficient antenatal care, second trimester: Secondary | ICD-10-CM

## 2019-09-07 DIAGNOSIS — Z113 Encounter for screening for infections with a predominantly sexual mode of transmission: Secondary | ICD-10-CM

## 2019-09-07 DIAGNOSIS — O34219 Maternal care for unspecified type scar from previous cesarean delivery: Secondary | ICD-10-CM

## 2019-09-07 DIAGNOSIS — O99012 Anemia complicating pregnancy, second trimester: Secondary | ICD-10-CM

## 2019-09-07 MED ORDER — FERRALET 90 90-1 MG PO TABS
1.0000 | ORAL_TABLET | Freq: Every day | ORAL | 5 refills | Status: DC
Start: 2019-09-07 — End: 2020-06-23

## 2019-09-07 NOTE — Addendum Note (Signed)
Addended by: Prentice Docker D on: 09/07/2019 04:57 PM   Modules accepted: Orders

## 2019-09-07 NOTE — Progress Notes (Signed)
Routine Prenatal Care Visit  Subjective  Lori Pollard is a 28 y.o. [phone] at [redacted]w[redacted]d being seen today for ongoing prenatal care.  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  ----------------------------------------------------------------------------------- Patient reports no complaints.   Contractions: Not present. Vag. Bleeding: None.  Movement: Present. Leaking Fluid denies.  ----------------------------------------------------------------------------------- The following portions of the patient's history were reviewed and updated as appropriate: allergies, current medications, past family history, past medical history, past social history, past surgical history and problem list. Problem list updated.  Objective  Blood pressure 97/65, weight 96 lb (43.5 kg), last menstrual period 12/24/2018. Pregravid weight 90 lb (40.8 kg) Total Weight Gain 6 lb (2.722 kg) Urinalysis: Urine Protein    Urine Glucose    Fetal Status: Fetal Heart Rate (bpm): 155 Fundal Height: 26 cm Movement: Present     General:  Alert, oriented and cooperative. Patient is in no acute distress.  Skin: Skin is warm and dry. No rash noted.   Cardiovascular: Normal heart rate noted  Respiratory: Normal respiratory effort, no problems with respiration noted  Abdomen: Soft, gravid, appropriate for gestational age. Pain/Pressure: Absent     Pelvic:   Cervical exam deferred        Extremities: Normal range of motion.     Mental Status: Normal mood and affect. Normal behavior. Normal judgment and thought content.   Assessment   28 y.o. 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 07/14/2019 by Will Bonnet, MD No   Overview Addendum 08/05/2019  6:37 PM by Malachy Mood, MD    [facility] Prenatal Labs  Dating 21 week [place] Blood type: O/Positive/-- (09/03 1551)   Genetic Screen 1 Screen:    AFP:     Quad:     NIPS: Antibody:Negative (09/03 1551)  Anatomic [place] Normal Female Rubella: 1.61 (09/03 1551) Varicella: Immune  GTT Early:               Third trimester:  RPR: Non Reactive (09/03 1551)   Rhogam  HBsAg: Negative (09/03 1551)   TDaP vaccine                       Flu Shot: HIV: Non Reactive (09/03 1551)   Baby Food                                GBS:   Contraception  Pap:  CBB     CS/VBAC    Support Person            History of cesarean delivery 07/14/2019 by Will Bonnet, MD No   Overview Signed 07/14/2019  4:02 PM by Will Bonnet, MD    H/o c-section x 2      Suboxone maintenance treatment complicating pregnancy, antepartum ([place])  07/14/2019 by Conard Novak, MD No   Overview Signed 07/14/2019  4:03 PM by Conard Novak, MD    suboxone 20 mg daily. See NOB note from 9/3 for provider and location of Rx      Late prenatal care affecting pregnancy in second trimester 07/14/2019 by Conard Novak, MD No   Anemia during pregnancy in second trimester 07/14/2019 by Conard Novak, MD No   Overview Signed 07/14/2019  4:03 PM by Conard Novak, MD    hgb 9.3 prior to NOB appt          Preterm labor symptoms and general obstetric precautions including but not limited to vaginal  bleeding, contractions, leaking of fluid and fetal movement were reviewed in detail with the patient. Please refer to After Visit Summary for other counseling recommendations.    Return in about 1 week (around 09/14/2019) for 28 week labs and routine prenatal.  Thomasene Mohair, MD, [facility], [facility] 09/07/2019 4:54 PM

## 2019-09-21 ENCOUNTER — Encounter: Payer: Medicaid Other | Admitting: Obstetrics and Gynecology

## 2019-09-27 ENCOUNTER — Other Ambulatory Visit: Payer: Self-pay

## 2019-09-27 ENCOUNTER — Encounter: Payer: Self-pay | Admitting: Maternal Newborn

## 2019-09-27 ENCOUNTER — Ambulatory Visit (INDEPENDENT_AMBULATORY_CARE_PROVIDER_SITE_OTHER): Payer: Medicaid Other | Admitting: Maternal Newborn

## 2019-09-27 ENCOUNTER — Other Ambulatory Visit: Payer: Medicaid Other

## 2019-09-27 VITALS — BP 100/60 | Wt 105.0 lb

## 2019-09-27 DIAGNOSIS — Z3A29 29 weeks gestation of pregnancy: Secondary | ICD-10-CM | POA: Diagnosis not present

## 2019-09-27 DIAGNOSIS — Z131 Encounter for screening for diabetes mellitus: Secondary | ICD-10-CM

## 2019-09-27 DIAGNOSIS — O09A2 Supervision of pregnancy with history of molar pregnancy, second trimester: Secondary | ICD-10-CM

## 2019-09-27 DIAGNOSIS — O09A3 Supervision of pregnancy with history of molar pregnancy, third trimester: Secondary | ICD-10-CM

## 2019-09-27 DIAGNOSIS — Z113 Encounter for screening for infections with a predominantly sexual mode of transmission: Secondary | ICD-10-CM

## 2019-09-27 NOTE — Progress Notes (Signed)
Routine Prenatal Care Visit  Subjective  Lori Pollard is a 28 y.o. [phone] at [redacted]w[redacted]d being seen today for ongoing prenatal care.  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  ----------------------------------------------------------------------------------- Patient reports heartburn. Tums no longer helping to control symptoms.  Contractions: Not present. Vag. Bleeding: None.  Movement: Present. No leaking of fluid.  ----------------------------------------------------------------------------------- The following portions of the patient's history were reviewed and updated as appropriate: allergies, current medications, past family history, past medical history, past social history, past surgical history and problem list. Problem list updated.   Objective  Blood pressure 100/60, weight 105 lb (47.6 kg), last menstrual period 12/24/2018. Pregravid weight 90 lb (40.8 kg) Total Weight Gain 15 lb (6.804 kg)  Fetal Status: Fetal Heart Rate (bpm): 150 Fundal Height: 28 cm Movement: Present     General:  Alert, oriented and cooperative. Patient is in no acute distress.  Skin: Skin is warm and dry. No rash noted.   Cardiovascular: Normal heart rate noted  Respiratory: Normal respiratory effort, no problems with respiration noted  Abdomen: Soft, gravid, appropriate for gestational age. Pain/Pressure: Absent     Pelvic:   Cervical exam deferred        Extremities: Normal range of motion.     Mental Status: Normal mood and affect. Normal behavior. Normal judgment and thought content.     Assessment   28 y.o. [id number] at [redacted]w[redacted]d EDD 12/13/2019, by Ultrasound presenting for a routine prenatal visit.  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 07/14/2019 by Conard Novak, MD No   Overview Addendum 08/05/2019  6:37 PM by Vena Austria, MD    [facility] Prenatal Labs  Dating 21 week [place] Blood type: O/Positive/-- (09/03 1551)   Genetic Screen 1 Screen:    AFP:     Quad:     NIPS: Antibody:Negative (09/03 1551)  Anatomic [place] Normal Female Rubella: 1.61 (09/03 1551) Varicella: Immune  GTT Early:               Third trimester:  RPR: Non Reactive (09/03 1551)   Rhogam  HBsAg: Negative (09/03 1551)   TDaP vaccine                       Flu Shot: HIV: Non Reactive (09/03 1551)   Baby Food                                GBS:   Contraception  Pap:  CBB     CS/VBAC    Support Person            History of cesarean delivery 07/14/2019 by Conard Novak, MD No   Overview Signed 07/14/2019  4:02 PM by Conard Novak, MD    H/o c-section x 2      Suboxone maintenance treatment complicating pregnancy, antepartum (  [place]) 07/14/2019 by Will Bonnet, MD No   Overview Signed 07/14/2019  4:03 PM by Will Bonnet, MD    suboxone 20 mg daily. See NOB note from 9/3 for provider and location of Rx      Late prenatal care affecting pregnancy in second trimester 07/14/2019 by Will Bonnet, MD No   Anemia during pregnancy in second trimester 07/14/2019 by Will Bonnet, MD No   Overview Signed 07/14/2019  4:03 PM by Will Bonnet, MD    hgb 9.3 prior to NOB appt       GTT and 28 week labs today.  Dicussed using OTC Pepcid to help with heartburn symptoms.  Please  refer to After Visit Summary for other counseling recommendations.   Return in about 2 weeks (around 10/11/2019) for ROB.  Avel Sensor, CNM 09/27/2019  9:34 AM

## 2019-09-27 NOTE — Patient Instructions (Signed)
Third Trimester of Pregnancy The third trimester is from week 28 through week 40 (months 7 through 9). The third trimester is a time when the unborn baby (fetus) is growing rapidly. At the end of the ninth month, the fetus is about 20 inches in length and weighs 6-10 pounds. Body changes during your third trimester Your body will continue to go through many changes during pregnancy. The changes vary from woman to woman. During the third trimester:  Your weight will continue to increase. You can expect to gain 25-35 pounds (11-16 kg) by the end of the pregnancy.  You may begin to get stretch marks on your hips, abdomen, and breasts.  You may urinate more often because the fetus is moving lower into your pelvis and pressing on your bladder.  You may develop or continue to have heartburn. This is caused by increased hormones that slow down muscles in the digestive tract.  You may develop or continue to have constipation because increased hormones slow digestion and cause the muscles that push waste through your intestines to relax.  You may develop hemorrhoids. These are swollen veins (varicose veins) in the rectum that can itch or be painful.  You may develop swollen, bulging veins (varicose veins) in your legs.  You may have increased body aches in the pelvis, back, or thighs. This is due to weight gain and increased hormones that are relaxing your joints.  You may have changes in your hair. These can include thickening of your hair, rapid growth, and changes in texture. Some women also have hair loss during or after pregnancy, or hair that feels dry or thin. Your hair will most likely return to normal after your baby is born.  Your breasts will continue to grow and they will continue to become tender. A yellow fluid (colostrum) may leak from your breasts. This is the first milk you are producing for your baby.  Your belly button may stick out.  You may notice more swelling in your hands,  face, or ankles.  You may have increased tingling or numbness in your hands, arms, and legs. The skin on your belly may also feel numb.  You may feel short of breath because of your expanding uterus.  You may have more problems sleeping. This can be caused by the size of your belly, increased need to urinate, and an increase in your body's metabolism.  You may notice the fetus "dropping," or moving lower in your abdomen (lightening).  You may have increased vaginal discharge.  You may notice your joints feel loose and you may have pain around your pelvic bone. What to expect at prenatal visits You will have prenatal exams every 2 weeks until week 36. Then you will have weekly prenatal exams. During a routine prenatal visit:  You will be weighed to make sure you and the baby are growing normally.  Your blood pressure will be taken.  Your abdomen will be measured to track your baby's growth.  The fetal heartbeat will be listened to.  Any test results from the previous visit will be discussed.  You may have a cervical check near your due date to see if your cervix has softened or thinned (effaced).  You will be tested for Group B streptococcus. This happens between 35 and 37 weeks. Your health care provider may ask you:  What your birth plan is.  How you are feeling.  If you are feeling the baby move.  If you have had any abnormal   symptoms, such as leaking fluid, bleeding, severe headaches, or abdominal cramping.  If you are using any tobacco products, including cigarettes, chewing tobacco, and electronic cigarettes.  If you have any questions. Other tests or screenings that may be performed during your third trimester include:  Blood tests that check for low iron levels (anemia).  Fetal testing to check the health, activity level, and growth of the fetus. Testing is done if you have certain medical conditions or if there are problems during the pregnancy.  Nonstress test  (NST). This test checks the health of your baby to make sure there are no signs of problems, such as the baby not getting enough oxygen. During this test, a belt is placed around your belly. The baby is made to move, and its heart rate is monitored during movement. What is false labor? False labor is a condition in which you feel small, irregular tightenings of the muscles in the womb (contractions) that usually go away with rest, changing position, or drinking water. These are called Braxton Hicks contractions. Contractions may last for hours, days, or even weeks before true labor sets in. If contractions come at regular intervals, become more frequent, increase in intensity, or become painful, you should see your health care provider. What are the signs of labor?  Abdominal cramps.  Regular contractions that start at 10 minutes apart and become stronger and more frequent with time.  Contractions that start on the top of the uterus and spread down to the lower abdomen and back.  Increased pelvic pressure and dull back pain.  A watery or bloody mucus discharge that comes from the vagina.  Leaking of amniotic fluid. This is also known as your "water breaking." It could be a slow trickle or a gush. Let your health care provider know if it has a color or strange odor. If you have any of these signs, call your health care provider right away, even if it is before your due date. Follow these instructions at home: Medicines  Follow your health care provider's instructions regarding medicine use. Specific medicines may be either safe or unsafe to take during pregnancy.  Take a prenatal vitamin that contains at least 600 micrograms (mcg) of folic acid.  If you develop constipation, try taking a stool softener if your health care provider approves. Eating and drinking   Eat a balanced diet that includes fresh fruits and vegetables, whole grains, good sources of protein such as meat, eggs, or tofu,  and low-fat dairy. Your health care provider will help you determine the amount of weight gain that is right for you.  Avoid raw meat and uncooked cheese. These carry germs that can cause birth defects in the baby.  If you have low calcium intake from food, talk to your health care provider about whether you should take a daily calcium supplement.  Eat four or five small meals rather than three large meals a day.  Limit foods that are high in fat and processed sugars, such as fried and sweet foods.  To prevent constipation: ? Drink enough fluid to keep your urine clear or pale yellow. ? Eat foods that are high in fiber, such as fresh fruits and vegetables, whole grains, and beans. Activity  Exercise only as directed by your health care provider. Most women can continue their usual exercise routine during pregnancy. Try to exercise for 30 minutes at least 5 days a week. Stop exercising if you experience uterine contractions.  Avoid heavy lifting.  Do   not exercise in extreme heat or humidity, or at high altitudes.  Wear low-heel, comfortable shoes.  Practice good posture.  You may continue to have sex unless your health care provider tells you otherwise. Relieving pain and discomfort  Take frequent breaks and rest with your legs elevated if you have leg cramps or low back pain.  Take warm sitz baths to soothe any pain or discomfort caused by hemorrhoids. Use hemorrhoid cream if your health care provider approves.  Wear a good support bra to prevent discomfort from breast tenderness.  If you develop varicose veins: ? Wear support pantyhose or compression stockings as told by your healthcare provider. ? Elevate your feet for 15 minutes, 3-4 times a day. Prenatal care  Write down your questions. Take them to your prenatal visits.  Keep all your prenatal visits as told by your health care provider. This is important. Safety  Wear your seat belt at all times when driving.  Make  a list of emergency phone numbers, including numbers for family, friends, the hospital, and police and fire departments. General instructions  Avoid cat litter boxes and soil used by cats. These carry germs that can cause birth defects in the baby. If you have a cat, ask someone to clean the litter box for you.  Do not travel far distances unless it is absolutely necessary and only with the approval of your health care provider.  Do not use hot tubs, steam rooms, or saunas.  Do not drink alcohol.  Do not use any products that contain nicotine or tobacco, such as cigarettes and e-cigarettes. If you need help quitting, ask your health care provider.  Do not use any medicinal herbs or unprescribed drugs. These chemicals affect the formation and growth of the baby.  Do not douche or use tampons or scented sanitary pads.  Do not cross your legs for long periods of time.  To prepare for the arrival of your baby: ? Take prenatal classes to understand, practice, and ask questions about labor and delivery. ? Make a trial run to the hospital. ? Visit the hospital and tour the maternity area. ? Arrange for maternity or paternity leave through employers. ? Arrange for family and friends to take care of pets while you are in the hospital. ? Purchase a rear-facing car seat and make sure you know how to install it in your car. ? Pack your hospital bag. ? Prepare the baby's nursery. Make sure to remove all pillows and stuffed animals from the baby's crib to prevent suffocation.  Visit your dentist if you have not gone during your pregnancy. Use a soft toothbrush to brush your teeth and be gentle when you floss. Contact a health care provider if:  You are unsure if you are in labor or if your water has broken.  You become dizzy.  You have mild pelvic cramps, pelvic pressure, or nagging pain in your abdominal area.  You have lower back pain.  You have persistent nausea, vomiting, or diarrhea.   You have an unusual or bad smelling vaginal discharge.  You have pain when you urinate. Get help right away if:  Your water breaks before 37 weeks.  You have regular contractions less than 5 minutes apart before 37 weeks.  You have a fever.  You are leaking fluid from your vagina.  You have spotting or bleeding from your vagina.  You have severe abdominal pain or cramping.  You have rapid weight loss or weight gain.  You have   shortness of breath with chest pain.  You notice sudden or extreme swelling of your face, hands, ankles, feet, or legs.  Your baby makes fewer than 10 movements in 2 hours.  You have severe headaches that do not go away when you take medicine.  You have vision changes. Summary  The third trimester is from week 28 through week 40, months 7 through 9. The third trimester is a time when the unborn baby (fetus) is growing rapidly.  During the third trimester, your discomfort may increase as you and your baby continue to gain weight. You may have abdominal, leg, and back pain, sleeping problems, and an increased need to urinate.  During the third trimester your breasts will keep growing and they will continue to become tender. A yellow fluid (colostrum) may leak from your breasts. This is the first milk you are producing for your baby.  False labor is a condition in which you feel small, irregular tightenings of the muscles in the womb (contractions) that eventually go away. These are called Braxton Hicks contractions. Contractions may last for hours, days, or even weeks before true labor sets in.  Signs of labor can include: abdominal cramps; regular contractions that start at 10 minutes apart and become stronger and more frequent with time; watery or bloody mucus discharge that comes from the vagina; increased pelvic pressure and dull back pain; and leaking of amniotic fluid. This information is not intended to replace advice given to you by your health  care provider. Make sure you discuss any questions you have with your health care provider. Document Released: 10/21/2001 Document Revised: 02/17/2019 Document Reviewed: 12/02/2016 Elsevier Patient Education  2020 [employer]  

## 2019-09-28 LAB — 28 WEEK RH+PANEL
Basophils Absolute: 0 10*3/uL (ref 0.0–0.2)
Basos: 0 %
EOS (ABSOLUTE): 0.1 10*3/uL (ref 0.0–0.4)
Eos: 1 %
Gestational Diabetes Screen: 95 mg/dL (ref 65–139)
HIV Screen 4th Generation wRfx: NONREACTIVE
Hematocrit: 33.1 % — ABNORMAL LOW (ref 34.0–46.6)
Hemoglobin: 11.4 g/dL (ref 11.1–15.9)
Immature Grans (Abs): 0 10*3/uL (ref 0.0–0.1)
Immature Granulocytes: 0 %
Lymphocytes Absolute: 1.4 10*3/uL (ref 0.7–3.1)
Lymphs: 16 %
MCH: 32.1 pg (ref 26.6–33.0)
MCHC: 34.4 g/dL (ref 31.5–35.7)
MCV: 93 fL (ref 79–97)
Monocytes Absolute: 0.5 10*3/uL (ref 0.1–0.9)
Monocytes: 6 %
Neutrophils Absolute: 6.9 10*3/uL (ref 1.4–7.0)
Neutrophils: 77 %
Platelets: 240 10*3/uL (ref 150–450)
RBC: 3.55 x10E6/uL — ABNORMAL LOW (ref 3.77–5.28)
RDW: 12.5 % (ref 11.7–15.4)
RPR Ser Ql: NONREACTIVE
WBC: 9 10*3/uL (ref 3.4–10.8)

## 2019-10-11 ENCOUNTER — Encounter: Payer: Medicaid Other | Admitting: Advanced Practice Midwife

## 2019-11-01 ENCOUNTER — Encounter: Payer: Self-pay | Admitting: Obstetrics and Gynecology

## 2019-11-01 ENCOUNTER — Observation Stay: Payer: Medicaid Other

## 2019-11-01 ENCOUNTER — Other Ambulatory Visit: Payer: Self-pay

## 2019-11-01 ENCOUNTER — Inpatient Hospital Stay: Payer: Medicaid Other | Admitting: Anesthesiology

## 2019-11-01 ENCOUNTER — Encounter: Payer: Self-pay | Admitting: Registered Nurse

## 2019-11-01 ENCOUNTER — Encounter
Admission: EM | Disposition: A | Discharge status: Home / Self Care | Payer: Self-pay | Source: Home / Self Care | Attending: Obstetrics and Gynecology

## 2019-11-01 ENCOUNTER — Inpatient Hospital Stay
Admission: EM | Admit: 2019-11-01 | Discharge: 2019-11-03 | DRG: 787 | Disposition: A | Discharge status: Home / Self Care | Payer: Medicaid Other | Attending: Obstetrics and Gynecology | Admitting: Obstetrics and Gynecology

## 2019-11-01 DIAGNOSIS — O99334 Smoking (tobacco) complicating childbirth: Secondary | ICD-10-CM | POA: Diagnosis present

## 2019-11-01 DIAGNOSIS — O09A2 Supervision of pregnancy with history of molar pregnancy, second trimester: Secondary | ICD-10-CM

## 2019-11-01 DIAGNOSIS — O4693 Antepartum hemorrhage, unspecified, third trimester: Secondary | ICD-10-CM | POA: Diagnosis present

## 2019-11-01 DIAGNOSIS — O4100X0 Oligohydramnios, unspecified trimester, not applicable or unspecified: Secondary | ICD-10-CM

## 2019-11-01 DIAGNOSIS — O99320 Drug use complicating pregnancy, unspecified trimester: Secondary | ICD-10-CM

## 2019-11-01 DIAGNOSIS — O34211 Maternal care for low transverse scar from previous cesarean delivery: Secondary | ICD-10-CM | POA: Diagnosis present

## 2019-11-01 DIAGNOSIS — O4593 Premature separation of placenta, unspecified, third trimester: Secondary | ICD-10-CM | POA: Diagnosis present

## 2019-11-01 DIAGNOSIS — Z20828 Contact with and (suspected) exposure to other viral communicable diseases: Secondary | ICD-10-CM | POA: Diagnosis present

## 2019-11-01 DIAGNOSIS — O99324 Drug use complicating childbirth: Secondary | ICD-10-CM | POA: Diagnosis not present

## 2019-11-01 DIAGNOSIS — Z3A34 34 weeks gestation of pregnancy: Secondary | ICD-10-CM | POA: Diagnosis not present

## 2019-11-01 DIAGNOSIS — F17210 Nicotine dependence, cigarettes, uncomplicated: Secondary | ICD-10-CM | POA: Diagnosis present

## 2019-11-01 DIAGNOSIS — Z98891 History of uterine scar from previous surgery: Secondary | ICD-10-CM

## 2019-11-01 DIAGNOSIS — O365930 Maternal care for other known or suspected poor fetal growth, third trimester, not applicable or unspecified: Secondary | ICD-10-CM | POA: Diagnosis present

## 2019-11-01 DIAGNOSIS — F1121 Opioid dependence, in remission: Secondary | ICD-10-CM | POA: Diagnosis present

## 2019-11-01 DIAGNOSIS — O0932 Supervision of pregnancy with insufficient antenatal care, second trimester: Secondary | ICD-10-CM

## 2019-11-01 DIAGNOSIS — F1490 Cocaine use, unspecified, uncomplicated: Secondary | ICD-10-CM | POA: Diagnosis present

## 2019-11-01 DIAGNOSIS — O4690 Antepartum hemorrhage, unspecified, unspecified trimester: Secondary | ICD-10-CM | POA: Diagnosis present

## 2019-11-01 DIAGNOSIS — O99012 Anemia complicating pregnancy, second trimester: Secondary | ICD-10-CM

## 2019-11-01 DIAGNOSIS — O34219 Maternal care for unspecified type scar from previous cesarean delivery: Secondary | ICD-10-CM | POA: Diagnosis not present

## 2019-11-01 DIAGNOSIS — F1120 Opioid dependence, uncomplicated: Secondary | ICD-10-CM

## 2019-11-01 LAB — URINE DRUG SCREEN, QUALITATIVE (ARMC ONLY)
Amphetamines, Ur Screen: NOT DETECTED
Barbiturates, Ur Screen: NOT DETECTED
Benzodiazepine, Ur Scrn: NOT DETECTED
Cannabinoid 50 Ng, Ur ~~LOC~~: NOT DETECTED
Cocaine Metabolite,Ur ~~LOC~~: POSITIVE — AB
MDMA (Ecstasy)Ur Screen: NOT DETECTED
Methadone Scn, Ur: NOT DETECTED
Opiate, Ur Screen: NOT DETECTED
Phencyclidine (PCP) Ur S: NOT DETECTED
Tricyclic, Ur Screen: NOT DETECTED

## 2019-11-01 LAB — HEPATITIS C ANTIBODY: HCV Ab: NONREACTIVE

## 2019-11-01 LAB — CBC
HCT: 35.3 % — ABNORMAL LOW (ref 36.0–46.0)
Hemoglobin: 11.8 g/dL — ABNORMAL LOW (ref 12.0–15.0)
MCH: 31.1 pg (ref 26.0–34.0)
MCHC: 33.4 g/dL (ref 30.0–36.0)
MCV: 92.9 fL (ref 80.0–100.0)
Platelets: 192 10*3/uL (ref 150–400)
RBC: 3.8 MIL/uL — ABNORMAL LOW (ref 3.87–5.11)
RDW: 12.7 % (ref 11.5–15.5)
WBC: 6.7 10*3/uL (ref 4.0–10.5)
nRBC: 0 % (ref 0.0–0.2)

## 2019-11-01 LAB — PROTIME-INR
INR: 0.9 (ref 0.8–1.2)
Prothrombin Time: 11.5 seconds (ref 11.4–15.2)

## 2019-11-01 LAB — CHLAMYDIA/NGC RT PCR (ARMC ONLY)
Chlamydia Tr: NOT DETECTED
N gonorrhoeae: NOT DETECTED

## 2019-11-01 LAB — COMPREHENSIVE METABOLIC PANEL
ALT: 13 U/L (ref 0–44)
AST: 18 U/L (ref 15–41)
Albumin: 3 g/dL — ABNORMAL LOW (ref 3.5–5.0)
Alkaline Phosphatase: 288 U/L — ABNORMAL HIGH (ref 38–126)
Anion gap: 8 (ref 5–15)
BUN: 15 mg/dL (ref 6–20)
CO2: 23 mmol/L (ref 22–32)
Calcium: 8.7 mg/dL — ABNORMAL LOW (ref 8.9–10.3)
Chloride: 101 mmol/L (ref 98–111)
Creatinine, Ser: 0.5 mg/dL (ref 0.44–1.00)
GFR calc Af Amer: >60 mL/min (ref >60)
GFR calc non Af Amer: >60 mL/min (ref >60)
Glucose, Bld: 80 mg/dL (ref 70–99)
Potassium: 3.9 mmol/L (ref 3.5–5.1)
Sodium: 132 mmol/L — ABNORMAL LOW (ref 135–145)
Total Bilirubin: 0.6 mg/dL (ref 0.3–1.2)
Total Protein: 7.2 g/dL (ref 6.5–8.1)

## 2019-11-01 LAB — TYPE AND SCREEN
ABO/RH(D): O POS
Antibody Screen: NEGATIVE

## 2019-11-01 LAB — RESPIRATORY PANEL BY RT PCR (FLU A&B, COVID)
Influenza A by PCR: NEGATIVE
Influenza B by PCR: NEGATIVE
SARS Coronavirus 2 by RT PCR: NEGATIVE

## 2019-11-01 LAB — RAPID HIV SCREEN (HIV 1/2 AB+AG)
HIV 1/2 Antibodies: NONREACTIVE
HIV-1 P24 Antigen - HIV24: NONREACTIVE

## 2019-11-01 LAB — KLEIHAUER-BETKE STAIN
Fetal Cells %: 0 %
Quantitation Fetal Hemoglobin: 0 mL

## 2019-11-01 LAB — FIBRINOGEN: Fibrinogen: 554 mg/dL — ABNORMAL HIGH (ref 210–475)

## 2019-11-01 LAB — GROUP B STREP BY PCR: Group B strep by PCR: NEGATIVE

## 2019-11-01 SURGERY — Surgical Case
Anesthesia: Spinal

## 2019-11-01 SURGERY — Surgical Case
Anesthesia: Choice

## 2019-11-01 MED ORDER — KETOROLAC TROMETHAMINE 30 MG/ML IJ SOLN
30.0000 mg | Freq: Once | INTRAMUSCULAR | Status: AC
  Administered 2019-11-01: 30 mg via INTRAVENOUS
  Filled 2019-11-01: qty 1

## 2019-11-01 MED ORDER — MAGNESIUM SULFATE 40 GM/1000ML IV SOLN: 2.0000 g/h | INTRAVENOUS | Status: DC

## 2019-11-01 MED ORDER — IBUPROFEN 800 MG PO TABS
800.0000 mg | ORAL_TABLET | Freq: Three times a day (TID) | ORAL | Status: DC
  Administered 2019-11-02 – 2019-11-03 (×4): 800 mg via ORAL
  Filled 2019-11-01 (×5): qty 1

## 2019-11-01 MED ORDER — CEFAZOLIN SODIUM-DEXTROSE 2-4 GM/100ML-% IV SOLN
2.0000 g | INTRAVENOUS | Status: AC
  Administered 2019-11-01: 2 g via INTRAVENOUS
  Filled 2019-11-01: qty 100

## 2019-11-01 MED ORDER — BETAMETHASONE SOD PHOS & ACET 6 (3-3) MG/ML IJ SUSP: 12.0000 mg | Freq: Once | INTRAMUSCULAR | Status: AC

## 2019-11-01 MED ORDER — CALCIUM GLUCONATE 10 % IV SOLN
INTRAVENOUS | Status: AC
  Filled 2019-11-01: qty 10

## 2019-11-01 MED ORDER — MISOPROSTOL 200 MCG PO TABS
ORAL_TABLET | ORAL | Status: AC
  Filled 2019-11-01: qty 1

## 2019-11-01 MED ORDER — NICOTINE 14 MG/24HR TD PT24
14.0000 mg | MEDICATED_PATCH | Freq: Every day | TRANSDERMAL | Status: DC
  Administered 2019-11-02 – 2019-11-03 (×3): 14 mg via TRANSDERMAL
  Filled 2019-11-01 (×3): qty 1

## 2019-11-01 MED ORDER — SOD CITRATE-CITRIC ACID 500-334 MG/5ML PO SOLN
30.0000 mL | ORAL | Status: AC
  Administered 2019-11-01: 30 mL via ORAL

## 2019-11-01 MED ORDER — BUPRENORPHINE HCL-NALOXONE HCL 8-2 MG SL SUBL
1.0000 | SUBLINGUAL_TABLET | Freq: Two times a day (BID) | SUBLINGUAL | Status: DC
  Administered 2019-11-01: 1 via SUBLINGUAL
  Filled 2019-11-01: qty 1

## 2019-11-01 MED ORDER — OXYTOCIN 40 UNITS IN NORMAL SALINE INFUSION - SIMPLE MED
2.5000 [IU]/h | INTRAVENOUS | Status: AC
  Administered 2019-11-01 – 2019-11-02 (×2): 2.5 [IU]/h via INTRAVENOUS
  Filled 2019-11-01: qty 1000

## 2019-11-01 MED ORDER — BUPIVACAINE IN DEXTROSE 0.75-8.25 % IT SOLN
INTRATHECAL | Status: DC | PRN
  Administered 2019-11-01: 1.4 mL via INTRATHECAL

## 2019-11-01 MED ORDER — BUPIVACAINE 0.25 % ON-Q PUMP SINGLE CATH 400 ML
400.0000 mL | INJECTION | Status: DC
  Filled 2019-11-01: qty 400

## 2019-11-01 MED ORDER — DIPHENHYDRAMINE HCL 25 MG PO CAPS: 25.0000 mg | ORAL_CAPSULE | Freq: Four times a day (QID) | ORAL | Status: DC | PRN

## 2019-11-01 MED ORDER — MAGNESIUM SULFATE 40 GM/1000ML IV SOLN
INTRAVENOUS | Status: AC
  Administered 2019-11-01: 4 g via INTRAVENOUS
  Filled 2019-11-01: qty 1000

## 2019-11-01 MED ORDER — BUPRENORPHINE HCL 2 MG SL SUBL
8.0000 mg | SUBLINGUAL_TABLET | Freq: Two times a day (BID) | SUBLINGUAL | Status: DC
  Administered 2019-11-02 – 2019-11-03 (×3): 8 mg via SUBLINGUAL
  Filled 2019-11-01 (×3): qty 4

## 2019-11-01 MED ORDER — OXYTOCIN 40 UNITS IN NORMAL SALINE INFUSION - SIMPLE MED
INTRAVENOUS | Status: AC
  Filled 2019-11-01: qty 1000

## 2019-11-01 MED ORDER — BETAMETHASONE SOD PHOS & ACET 6 (3-3) MG/ML IJ SUSP
INTRAMUSCULAR | Status: AC
  Administered 2019-11-01: 12 mg via INTRAMUSCULAR
  Filled 2019-11-01: qty 5

## 2019-11-01 MED ORDER — CARBOPROST TROMETHAMINE 250 MCG/ML IM SOLN
INTRAMUSCULAR | Status: AC
  Filled 2019-11-01: qty 1

## 2019-11-01 MED ORDER — MORPHINE SULFATE (PF) 0.5 MG/ML IJ SOLN
INTRAMUSCULAR | Status: AC
  Filled 2019-11-01: qty 10

## 2019-11-01 MED ORDER — BUPIVACAINE ON-Q PAIN PUMP (FOR ORDER SET NO CHG)
INJECTION | Status: DC
  Filled 2019-11-01 (×4): qty 1

## 2019-11-01 MED ORDER — LACTATED RINGERS IV SOLN: INTRAVENOUS | Status: DC

## 2019-11-01 MED ORDER — FENTANYL CITRATE (PF) 100 MCG/2ML IJ SOLN
INTRAMUSCULAR | Status: AC
  Filled 2019-11-01: qty 2

## 2019-11-01 MED ORDER — MAGNESIUM SULFATE BOLUS VIA INFUSION
4.0000 g | Freq: Once | INTRAVENOUS | Status: AC
  Filled 2019-11-01: qty 1000

## 2019-11-01 MED ORDER — BUPRENORPHINE HCL-NALOXONE HCL 2-0.5 MG SL SUBL: 2.0000 | SUBLINGUAL_TABLET | Freq: Every day | SUBLINGUAL | Status: DC

## 2019-11-01 MED ORDER — ACETAMINOPHEN 500 MG PO TABS
1000.0000 mg | ORAL_TABLET | Freq: Four times a day (QID) | ORAL | Status: DC
  Administered 2019-11-01 – 2019-11-03 (×6): 1000 mg via ORAL
  Filled 2019-11-01 (×6): qty 2

## 2019-11-01 MED ORDER — BUPIVACAINE HCL 0.5 % IJ SOLN
INTRAMUSCULAR | Status: DC | PRN
  Administered 2019-11-01: 10 mL

## 2019-11-01 MED ORDER — FENTANYL CITRATE (PF) 100 MCG/2ML IJ SOLN
INTRAMUSCULAR | Status: DC | PRN
  Administered 2019-11-01: 15 ug via INTRATHECAL

## 2019-11-01 MED ORDER — BUPIVACAINE HCL (PF) 0.5 % IJ SOLN
INTRAMUSCULAR | Status: AC
  Filled 2019-11-01: qty 30

## 2019-11-01 MED ORDER — SODIUM CHLORIDE 0.9 % IV SOLN
INTRAVENOUS | Status: DC | PRN
  Administered 2019-11-01: 30 ug/min via INTRAVENOUS

## 2019-11-01 MED ORDER — SOD CITRATE-CITRIC ACID 500-334 MG/5ML PO SOLN
ORAL | Status: AC
  Filled 2019-11-01: qty 15

## 2019-11-01 MED ORDER — OXYTOCIN 40 UNITS IN NORMAL SALINE INFUSION - SIMPLE MED
INTRAVENOUS | Status: DC | PRN
  Administered 2019-11-01: 40 mL via INTRAVENOUS

## 2019-11-01 MED ORDER — METHYLERGONOVINE MALEATE 0.2 MG/ML IJ SOLN
INTRAMUSCULAR | Status: AC
  Filled 2019-11-01: qty 1

## 2019-11-01 MED ORDER — LACTATED RINGERS IV BOLUS
1000.0000 mL | Freq: Once | INTRAVENOUS | Status: AC
  Administered 2019-11-01: 1000 mL via INTRAVENOUS

## 2019-11-01 MED ORDER — ONDANSETRON HCL 4 MG/2ML IJ SOLN
INTRAMUSCULAR | Status: DC | PRN
  Administered 2019-11-01: 4 mg via INTRAVENOUS

## 2019-11-01 MED ORDER — TETRACAINE HCL 1 % IJ SOLN
INTRAMUSCULAR | Status: DC | PRN
  Administered 2019-11-01: 1 mg via INTRASPINAL

## 2019-11-01 MED ORDER — NON FORMULARY: 8.0000 mg | Freq: Two times a day (BID) | Status: DC

## 2019-11-01 MED ORDER — PROPOFOL 10 MG/ML IV BOLUS
INTRAVENOUS | Status: AC
  Filled 2019-11-01: qty 20

## 2019-11-01 MED ORDER — PHENYLEPHRINE HCL (PRESSORS) 10 MG/ML IV SOLN
INTRAVENOUS | Status: DC | PRN
  Administered 2019-11-01: 100 ug via INTRAVENOUS

## 2019-11-01 MED ORDER — BUPRENORPHINE HCL 8 MG SL SUBL
8.0000 mg | SUBLINGUAL_TABLET | Freq: Two times a day (BID) | SUBLINGUAL | Status: DC
  Filled 2019-11-01: qty 1

## 2019-11-01 SURGICAL SUPPLY — 27 items
CANISTER SUCT 3000ML PPV (MISCELLANEOUS) ×3 IMPLANT
CATH KIT ON-Q SILVERSOAK 5 (CATHETERS) IMPLANT
CATH KIT ON-Q SILVERSOAK 5IN (CATHETERS) ×6 IMPLANT
CHLORAPREP W/TINT 26 (MISCELLANEOUS) ×6 IMPLANT
DERMABOND ADVANCED (GAUZE/BANDAGES/DRESSINGS) ×2
DERMABOND ADVANCED .7 DNX12 (GAUZE/BANDAGES/DRESSINGS) ×1 IMPLANT
DRSG OPSITE POSTOP 4X10 (GAUZE/BANDAGES/DRESSINGS) ×3 IMPLANT
ELECT CAUTERY BLADE 6.4 (BLADE) ×3 IMPLANT
ELECT REM PT RETURN 9FT ADLT (ELECTROSURGICAL) ×3
ELECTRODE REM PT RTRN 9FT ADLT (ELECTROSURGICAL) ×1 IMPLANT
GLOVE BIOGEL PI IND STRL 6.5 (GLOVE) ×2 IMPLANT
GLOVE BIOGEL PI INDICATOR 6.5 (GLOVE) ×4
GOWN STRL REUS W/ TWL LRG LVL3 (GOWN DISPOSABLE) ×1 IMPLANT
GOWN STRL REUS W/ TWL XL LVL3 (GOWN DISPOSABLE) ×2 IMPLANT
GOWN STRL REUS W/TWL LRG LVL3 (GOWN DISPOSABLE) ×2
GOWN STRL REUS W/TWL XL LVL3 (GOWN DISPOSABLE) ×4
NS IRRIG 1000ML POUR BTL (IV SOLUTION) ×3 IMPLANT
PACK C SECTION AR (MISCELLANEOUS) ×3 IMPLANT
PAD OB MATERNITY 4.3X12.25 (PERSONAL CARE ITEMS) ×3 IMPLANT
PAD PREP 24X41 OB/GYN DISP (PERSONAL CARE ITEMS) ×3 IMPLANT
PENCIL SMOKE ULTRAEVAC 22 CON (MISCELLANEOUS) ×3 IMPLANT
SLEEVE PROTECTION STRL DISP (MISCELLANEOUS) ×2 IMPLANT
SUT MNCRL AB 4-0 PS2 18 (SUTURE) ×3 IMPLANT
SUT PLAIN 3-0 (SUTURE) ×3 IMPLANT
SUT VIC AB 0 CT1 36 (SUTURE) ×9 IMPLANT
SUT VIC AB 2-0 CT1 36 (SUTURE) ×3 IMPLANT
SYR 30ML LL (SYRINGE) ×6 IMPLANT

## 2019-11-01 SURGICAL SUPPLY — 24 items
CANISTER SUCT 3000ML PPV (MISCELLANEOUS) ×3 IMPLANT
CHLORAPREP W/TINT 26 (MISCELLANEOUS) ×6 IMPLANT
DERMABOND ADVANCED (GAUZE/BANDAGES/DRESSINGS) ×2
DERMABOND ADVANCED .7 DNX12 (GAUZE/BANDAGES/DRESSINGS) ×1 IMPLANT
DRSG OPSITE POSTOP 4X10 (GAUZE/BANDAGES/DRESSINGS) ×3 IMPLANT
ELECT CAUTERY BLADE 6.4 (BLADE) ×3 IMPLANT
ELECT REM PT RETURN 9FT ADLT (ELECTROSURGICAL) ×3
ELECTRODE REM PT RTRN 9FT ADLT (ELECTROSURGICAL) ×1 IMPLANT
GLOVE BIOGEL PI IND STRL 6.5 (GLOVE) ×2 IMPLANT
GLOVE BIOGEL PI INDICATOR 6.5 (GLOVE) ×4
GOWN STRL REUS W/ TWL LRG LVL3 (GOWN DISPOSABLE) ×1 IMPLANT
GOWN STRL REUS W/ TWL XL LVL3 (GOWN DISPOSABLE) ×2 IMPLANT
GOWN STRL REUS W/TWL LRG LVL3 (GOWN DISPOSABLE) ×2
GOWN STRL REUS W/TWL XL LVL3 (GOWN DISPOSABLE) ×4
NS IRRIG 1000ML POUR BTL (IV SOLUTION) ×3 IMPLANT
PACK C SECTION AR (MISCELLANEOUS) ×3 IMPLANT
PAD OB MATERNITY 4.3X12.25 (PERSONAL CARE ITEMS) ×3 IMPLANT
PAD PREP 24X41 OB/GYN DISP (PERSONAL CARE ITEMS) ×3 IMPLANT
PENCIL SMOKE ULTRAEVAC 22 CON (MISCELLANEOUS) ×3 IMPLANT
SUT MNCRL AB 4-0 PS2 18 (SUTURE) ×3 IMPLANT
SUT PLAIN 3-0 (SUTURE) ×3 IMPLANT
SUT VIC AB 0 CT1 36 (SUTURE) ×9 IMPLANT
SUT VIC AB 2-0 CT1 36 (SUTURE) ×3 IMPLANT
SYR 30ML LL (SYRINGE) ×6 IMPLANT

## 2019-11-01 NOTE — Progress Notes (Signed)
Patient is sleeping soundly in room. Her contractions have stopped. She has not had further vaginal bleeding. Will continue to closely monitor. Acute episode appears to be resolving. Asked significant other to notify us when she wakes so I can perform another bedside US.   NST: 130 bpm baseline, moderate variability, 15x15 accelerations, no decelerations. Tocometer : no contractions noted  Adrian Prows MD [place] 11/01/2019 2:10 PM

## 2019-11-01 NOTE — Progress Notes (Signed)
Patient arrived by EMS with c/o large gush of blood yesterday around 1030am. She said that this gush saturated her pants and that she has been bleeding since then. I asked her if she called her doctor or anyone yesterday when she had that large gush of blood and she stated that she did call and the office advised her to come into the ER right away but that she didn't have a ride yesterday, so she decided not to come. She stated that her contractions started yesterday evening, but that they woke her up this morning and were painful, so she called EMS who brought her to the hospital. When Dr. Gilman Schmidt arrived to assess her bedside at 1150, the patient stated to her that she had only been bleeding a very little yesterday and this morning it got worse, soaking her pants along with the contractions and that's why she called EMS. Patient stated that the baby has been moving as normal the entire time. Edrick Kins, RN and I reviewed the patient history with her and with Dr. Gilman Schmidt. Received new orders. See EMR.

## 2019-11-01 NOTE — Progress Notes (Signed)
Pt sent to Ultrasound with RN.

## 2019-11-01 NOTE — Progress Notes (Signed)
First dose of Bethamethasone 12mg  given at 1205.

## 2019-11-01 NOTE — Transfer of Care (Signed)
Immediate Anesthesia Transfer of Care Note  Patient: Lori Pollard  Procedure(s) Performed: CESAREAN SECTION (N/A )  Patient Location: PACU and Mother/Baby  Anesthesia Type:Spinal  Level of Consciousness: awake, alert  and oriented  Airway & Oxygen Therapy: Patient Spontanous Breathing  Post-op Assessment: Report given to RN and Post -op Vital signs reviewed and stable  Post vital signs: Reviewed and stable  Last Vitals:  Vitals Value Taken Time  BP    Temp    Pulse    Resp    SpO2      Last Pain:  Vitals:   11/01/19 1640  TempSrc: Oral  PainSc:          Complications: No apparent anesthesia complications

## 2019-11-01 NOTE — Discharge Summary (Signed)
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 yo [id number] at [redacted]w[redacted]d was admitted for vaginal bleeding on 11/01/2019. Patient had a labor course significant for receiving betamethasone and magnessium. Bleeding initially improved and stabilized, but the occurred again in the evening.  The patient went for cesarean section due to placental abruption, and delivered a Viable infant,11/01/2019  Details of operation can be found in separate operative note. Patient had an uncomplicated postpartum course.  She is ambulating,tolerating a regular diet, passing flatus, and urinating well.  Patient is discharged home in stable condition 11/03/19.                                                                 Post partum procedures:none  Complications: Placental Abruption  Physical exam on 11/03/2019: Vitals:   11/02/19 1900 11/02/19 2021 11/02/19 2350 11/03/19 0815  BP:  111/79 111/82 113/81  Pulse:  64 78 (!) 57  Resp:  19 20 20   Temp:  98.1 F (36.7 C) 97.8 F (36.6 C) 98.1 F (36.7 C)  TempSrc:   Oral Oral  SpO2: 99% 100% 99%   Weight:      Height:       General: alert, cooperative and no distress Lochia: appropriate Uterine Fundus: firm Incision: Healing well with no significant drainage, No  significant erythema, Dressing is clean, dry, and intact DVT Evaluation: No evidence of DVT seen on physical exam. No cords or calf tenderness. No significant calf/ankle edema.  Labs: Lab Results  Component Value Date   WBC 16.2 (H) 11/02/2019   HGB 11.5 (L) 11/02/2019   HCT 34.0 (L) 11/02/2019   MCV 93.9 11/02/2019   PLT 169 11/02/2019   CMP Latest Ref Rng & Units 11/01/2019  Glucose 70 - 99 mg/dL 80  BUN 6 - 20 mg/dL 15  Creatinine 0.44 - 1.00 mg/dL 0.50  Sodium 135 - 145 mmol/L 132(L)  Potassium 3.5 - 5.1 mmol/L 3.9  Chloride 98 - 111 mmol/L 101  CO2 22 - 32 mmol/L 23  Calcium 8.9 - 10.3 mg/dL 8.7(L)  Total Protein 6.5 - 8.1 g/dL 7.2  Total Bilirubin 0.3 - 1.2 mg/dL 0.6  Alkaline Phos 38 - 126 U/L 288(H)  AST 15 - 41 U/L 18  ALT 0 - 44 U/L 13    Discharge instruction: per After Visit Summary.  Medications:  Allergies as of 11/03/2019   No Known Allergies     Medication List    STOP taking these medications   multivitamin-prenatal 27-0.8 MG Tabs tablet  TAKE these medications   albuterol (2.5 MG/3ML) 0.083% nebulizer solution Commonly known as: PROVENTIL Inhale into the lungs.   buprenorphine 8 MG Subl SL tablet Commonly known as: SUBUTEX PLACE 1 TABLET UNDER THE TONGUE TWICE DAILY AND ONE HALF AT MIDDAY. LAST REFILL UNTIL SEEN   Ferralet 90 90-1 MG Tabs Take 1 tablet by mouth daily.   HYDROcodone-acetaminophen 5-325 MG tablet Commonly known as: Norco Take 1 tablet by mouth every 6 (six) hours as needed for moderate pain.   ibuprofen 800 MG tablet Commonly known as: ADVIL Take 1 tablet (800 mg total) by mouth every 8 (eight) hours.            Discharge Care Instructions  (From admission, onward)         Start     Ordered   11/03/19 0000  Discharge wound care:    Comments: Perform wound care instructions   11/03/19 1009          Diet: routine diet  Activity: Advance as tolerated. Pelvic rest for 6 weeks.   Outpatient follow  up: Follow-up Information    Schuman, Jaquelyn Bitter, MD. Schedule an appointment as soon as possible for a visit in 1 week.   Specialty: Obstetrics and Gynecology Why: For incision check Contact information: [address] [place] [address] [phone]             Postpartum contraception: Depo Provera Rhogam Given postpartum: no Rubella vaccine given postpartum: no Varicella vaccine given postpartum: no  Newborn Data: Live born female  Birth Weight: 3 lb 6 oz (1530 g) APGAR: 8, 9  Newborn Delivery   Birth date/time: 11/01/2019 20:04:00 Delivery type: C-Section, Low Transverse Trial of labor: No C-section categorization: Repeat       Baby Feeding: Donor milk  Disposition:NICU  SIGNED: Thomasene Mohair, MD 11/03/2019 10:13 AM

## 2019-11-01 NOTE — Op Note (Signed)
Cesarean Section Procedure Note 11/01/19  Pre-operative Diagnosis:  1. Abruption  2. IUGR less than the 3rd percentile  3. 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 Late prenatal care  5. [redacted] weeks gestation Post-operative Diagnosis: same, delivered. Procedure: Repeat Low Transverse Cesarean Section   Surgeon: Adrian Prows MD   Anesthesia: Spinal Estimated Blood Loss: 329 cc Complications: None; patient tolerated the procedure well.  Disposition: PACU - hemodynamically stable. Condition: stable   Findings: A viable female infant in the cephalic presentation. Amniotic fluid - clear   Birth weight: 3 lbs 5oz Apgars of 8 and 9.  Intact placenta with a three-vessel cord. Grossly normal uterus, tubes and ovaries bilaterally. No intraabdominal adhesions were noted. Placenta was calcified. With uterine incisions clots of dark red blood were visible between the uterus and the amniotic fluid.    Procedure Details    The patient was taken to operating room, identified as the correct patient and the procedure verified as C-Section Delivery. A time out was held and the above information confirmed. After induction of anesthesia, the patient was draped and prepped in the usual sterile manner. A Pfannenstiel incision was made and carried down through the subcutaneous tissue to the fascia. Fascial incision was made and extended transversely with the Mayo scissors. The fascia was separated from the underlying rectus tissue superiorly and inferiorly. The peritoneum was identified and entered bluntly. Peritoneal incision was extended longitudinally. A low transverse hysterotomy was made. The fetus was delivered atraumatically. The umbilical cord was clamped x2 and cut and the infant was handed to the awaiting pediatricians. The placenta was removed intact and appeared normal with a 3-vessel cord. The uterus was exteriorized and cleared of all clot and debris. The hysterotomy was closed with running  sutures of 0 Vicryl suture. A second imbricating layer was placed with the same suture. Excellent hemostasis was observed. The uterus was returned to the abdomen. The pelvis was irrigated and again, excellent hemostasis was noted. The peritoneum was closed with a running stitch of 2-0 Vicryl. The On Q Pain pump System was then placed.  Trocars were placed through the abdominal wall into the subfascial space and these were used to thread the silver soaker cathaters into place.The rectus muscles were inspected and were hemostatic. The rectus fascia was then reapproximated with running sutures of 0-vicryl, with careful placement not to incorporate the cathaters. Subcutaneous tissues are then irrigated with saline and hemostasis assured with the bovie. The subcutaneous fat was approximated with 3-0 plain and a running stitch.  The skin was closed with 4-0 monocryl suture in a subcuticular fashion followed by skin adhesive. The cathaters are flushed each with 5 mL of Bupivicaine and stabilized into place with dressing. Instrument, sponge, and needle counts were correct prior to the abdominal closure and at the conclusion of the case.  The patient tolerated the procedure well and was transferred to the recovery room in stable condition.   Homero Fellers MD [facility] 11/01/19 8:52 PM

## 2019-11-01 NOTE — Progress Notes (Signed)
Pt returned from [place] and back in Obs 1. Stable condition, no vaginal bleeding noted.

## 2019-11-01 NOTE — Discharge Instructions (Signed)
Discharge Instructions:   Follow-up Appointment: 1 week, please call the office to schedule  If there are any new medications, they have been ordered and will be available for pickup at the listed pharmacy on your way home from the hospital.   Call the office if you have any of the following: headache, visual changes, fever >101.0 F, chills, shortness of breath, breast concerns, excessive vaginal bleeding, incision drainage or problems, leg pain or redness, depression or any other concerns. If you have vaginal discharge with an odor, let your doctor know.   It is normal to bleed for up to 6 weeks. You should not soak through more than 1 pad in 1 hour. If you have a blood clot larger than your fist with continued bleeding, call your doctor.   After a c-section, you should expect a small amount of blood or clear fluid coming from the incision and abdominal cramping/soreness. Inspect your incision site daily. Stand in front of a mirror to look for any redness, incision opening, or discolored/odorness drainage. Take a shower daily and continue good hygiene. Use own towel and washcloth (do not share). Make sure your sheets on your bed are clean. No pets sleeping around your incision site. Dressing will be removed at your postpartum visit. If the dressing does become wet or soiled underneath, it is okay to remove it before your visit.    On-Q pump: You will remove on day 4 after insertion or if the ball becomes flat before day 4. You will remove on: 11/05/2019  Activity: Do not lift > 15 lbs for 6 weeks (do not lift anything heavier than your baby). No intercourse, tampons, swimming pools, hot tubs, baths (only showers) for 6 weeks.  No driving for 1-2 weeks. Do not drive while taking narcotic or opioid pain medication.  Continue taking your prenatal vitamin, especially if breastfeeding. Increase calories and fluids (water) while breastfeeding.   Your milk will come in, in the next couple of days  (right now it is colostrum). You may have a slight fever when your milk comes in, but it should go away on its own.  If it does not, and rises above 101 F please call the doctor. You will also feel achy and your breasts will be firm. They will also start to leak. If you are breastfeeding, continue as you have been and you can pump/express milk for comfort.   If you have too much milk, your breasts can become engorged, which could lead to mastitis. This is an infection of the milk ducts. It can be very painful and you will need to notify your doctor to obtain a prescription for antibiotics. You can also treat it with a shower or hot/cold compress.   For concerns about your baby, please call your pediatrician.  For breastfeeding concerns, the lactation consultant can be reached at [phone].   Postpartum blues (feelings of happy one minute and sad another minute) are normal for the first few weeks but if it gets worse let your doctor know.   Congratulations! We enjoyed caring for you and your new bundle of joy!

## 2019-11-01 NOTE — H&P (Signed)
H&P  Lori Pollard is an 28 y.o. female.  HPI: She presents to L&D this morning by EMS for vaginal bleeding. She reports that yesterday the bleeding started as a small amount of blood on a pad. She had a large gush of blood today that saturated her pants.She started having contractions yesterday as well which she attributed to braxton hicks contractions. They were occurring about every 10 minutes. The contractions have continued today. She is painfully contracting  She is a Civil engineer, contracting. She uses 2.5 films a day. She last took suboxone yesterday. She takes 1 film in the morning, one film in the afternoon and a 1.2 film at night. She reports using cocaine last close to the time of her NOB appointment and says that this was her last usage.    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 07/14/2019 by Conard Novak, MD No   Overview Addendum 08/05/2019  6:37 PM by Vena Austria, MD    [facility]  Dating 21 week [place] Blood type: O/Positive/-- (09/03 1551)   Genetic Screen none Antibody:Negative (09/03 1551)  Anatomic [place] Normal Female Rubella: 1.61 (09/03 1551) Varicella: Immune  GTT Third trimester: 95 RPR: Non Reactive (09/03 1551)   Rhogam Not needed HBsAg: Negative (09/03 1551)   TDaP vaccine                       Flu Shot: HIV: Non Reactive (09/03 1551)   Baby Food                                GBS:   Contraception  Pap: NIL 07/14/2019  CBB     CS/VBAC  hx of cesarean x2   Support Person            History of cesarean delivery 07/14/2019 by Conard Novak, MD No   Overview Signed 07/14/2019  4:02 PM by Conard Novak, MD    H/o c-section x 2      Suboxone maintenance treatment complicating pregnancy, antepartum (HCC) 07/14/2019 by Conard Novak, MD No   Overview Signed 07/14/2019  4:03 PM by Conard Novak, MD     suboxone 20 mg daily. See NOB note from 9/3 for provider and location of Rx      Late prenatal care affecting pregnancy in second trimester 07/14/2019 by Conard Novak, MD No   Anemia during pregnancy in second trimester 07/14/2019 by Conard Novak, MD No   Overview Signed 07/14/2019  4:03 PM by Conard Novak, MD    hgb 9.3 prior to NOB appt         Past Medical History:  Diagnosis Date  . Cervicalgia   . History of chickenpox   . Hives     Past Surgical History:  Procedure Laterality Date  . CESAREAN SECTION  2013  . CESAREAN SECTION  2015    Family History  Problem Relation Age of Onset  . Alcohol abuse Mother   . GER disease Mother   . Depression Mother   . Alcohol abuse Father   . Emphysema Father   . Diabetes Maternal Grandmother   . Heart disease Maternal Grandmother   . Hyperlipidemia Maternal Grandmother   . Hypertension Maternal Grandmother     Social History:  reports that she has been smoking cigarettes. She started smoking about 15 years ago. She has a 5.50 pack-year smoking history. She has never used smokeless tobacco. She reports that she does not drink alcohol or use drugs.  Allergies: No Known Allergies  Medications: I have reviewed the patient's current medications.  No results found for this or any previous visit (from the past 48 hour(s)).  No results found.  Review of Systems  Constitutional: Negative for chills and fever.  HENT: Negative for congestion, hearing loss and sinus pain.   Respiratory: Negative for cough, shortness of breath and wheezing.   Cardiovascular: Negative for chest pain, palpitations and leg swelling.  Gastrointestinal: Negative for abdominal pain, constipation, diarrhea, nausea and vomiting.  Genitourinary: Negative for dysuria, flank pain, frequency, hematuria and urgency.  Musculoskeletal: Negative for back pain.  Skin: Negative for rash.  Neurological: Negative for dizziness and headaches.   Psychiatric/Behavioral: Negative for suicidal ideas. The patient is not nervous/anxious.    Blood pressure 116/74, pulse 68, temperature 97.6 F (36.4 C), temperature source Axillary, resp. rate 18, height 4\' 11"  (1.499 m), weight 48.1 kg, last menstrual period 12/24/2018, SpO2 100 %. Physical Exam  Nursing note and vitals reviewed. Constitutional: She is oriented to person, place, and time. She appears well-developed and well-nourished.  HENT:  Head: Normocephalic and atraumatic.  Cardiovascular: Normal rate and regular rhythm.  Respiratory: Effort normal and breath sounds normal.  GI: Soft. Bowel sounds are normal.  Musculoskeletal:        General: Normal range of motion.  Neurological: She is alert and oriented to person, place, and time.  Skin: Skin is warm and dry.  Psychiatric: She has a normal mood and affect. Her behavior is normal. Judgment and thought content normal.   Pelvic exam: External: Vulva normal. No lesions noted.  Speculum examination:  Cervix visually dilated approximately 2cm, fetal head appears to be presenting . Dark red blood- 10 ml in vaginal vault. No discharge.     NST: 135 bpm baseline, moderate variability, 15x15 accelerations, no decelerations. Tocometer : q 10 mintues Painfully contracting  Assessment/Plan: 28 yo G5P2022 at [redacted]w[redacted]d 1. History of 2 prior cesarean sections, desires repeat 2. Possible abruption. Will monitor vaginal bleeding and fetal heart rate closely, if needed will perform emergent cesarean section. Type and screen, Kleinhauer- Betke, and coagulation studies.  3. Will given betamethasone and magnesium for  possible preterm delivery. Patient has late prenatal care and dating by 20 week [place], given this will err on side of caution by administering magnesium.  4. IV drug abuse history - will obtain UDS, will reorder suboxone for hospital stay. Hepatitis C screening. 5.  Poor venous access. Will consult IV team- consider IO if needed.    Lori Pollard 11/01/2019, 12:08 PM

## 2019-11-01 NOTE — Anesthesia Procedure Notes (Signed)
Spinal  Patient location during procedure: OB Start time: 11/01/2019 7:35 PM End time: 11/01/2019 7:36 PM Staffing Performed: resident/CRNA  Anesthesiologist: Alvin Critchley, MD Resident/CRNA: Jerrye Noble, CRNA Preanesthetic Checklist Completed: patient identified, IV checked, site marked, risks and benefits discussed, surgical consent, monitors and equipment checked and pre-op evaluation Spinal Block Patient position: sitting Prep: ChloraPrep Patient monitoring: heart rate, continuous pulse ox and blood pressure Approach: midline Location: L3-4 Injection technique: single-shot Needle Needle type: Pencan  Needle gauge: 25 G

## 2019-11-01 NOTE — Anesthesia Preprocedure Evaluation (Signed)
Anesthesia Evaluation  Patient identified by MRN, date of birth, ID band Patient awake    Reviewed: Allergy & Precautions, NPO status , Patient's Chart, lab work & pertinent test results  Airway Mallampati: II  TM Distance: >3 FB     Dental  (+) Teeth Intact, Chipped   Pulmonary Current Smoker,    Pulmonary exam normal        Cardiovascular negative cardio ROS Normal cardiovascular exam     Neuro/Psych PSYCHIATRIC DISORDERS Anxiety Bipolar Disorder negative neurological ROS     GI/Hepatic negative GI ROS, (+)     substance abuse  cocaine use,   Endo/Other  negative endocrine ROS  Renal/GU negative Renal ROS  negative genitourinary   Musculoskeletal negative musculoskeletal ROS (+)   Abdominal Normal abdominal exam  (+)   Peds negative pediatric ROS (+)  Hematology  (+) anemia ,   Anesthesia Other Findings   Reproductive/Obstetrics (+) Pregnancy                             Anesthesia Physical Anesthesia Plan  ASA: II and emergent  Anesthesia Plan: Spinal   Post-op Pain Management:    Induction: Intravenous  PONV Risk Score and Plan:   Airway Management Planned: Nasal Cannula  Additional Equipment:   Intra-op Plan:   Post-operative Plan:   Informed Consent: I have reviewed the patients History and Physical, chart, labs and discussed the procedure including the risks, benefits and alternatives for the proposed anesthesia with the patient or authorized representative who has indicated his/her understanding and acceptance.     Dental advisory given  Plan Discussed with: CRNA and Surgeon  Anesthesia Plan Comments:         Anesthesia Quick Evaluation

## 2019-11-01 NOTE — Progress Notes (Signed)
Patient is again having bright red vaginal bleeding. Given concern for ongoing abruption will proceed for delivery by repeat cesarean delivery.   Adrian Prows MD [facility] 11/01/2019 7:06 PM

## 2019-11-01 NOTE — Anesthesia Post-op Follow-up Note (Signed)
Anesthesia QCDR form completed.        

## 2019-11-01 NOTE — Progress Notes (Signed)
Bedside US showed one 2x2 cm pocket, otherwise low amniotic fluid. Cephalic infant.  Will obtain official US for growth and AFI.    Adrian Prows MD [facility] 11/01/2019 3:02 PM

## 2019-11-02 LAB — CBC
HCT: 34 % — ABNORMAL LOW (ref 36.0–46.0)
Hemoglobin: 11.5 g/dL — ABNORMAL LOW (ref 12.0–15.0)
MCH: 31.8 pg (ref 26.0–34.0)
MCHC: 33.8 g/dL (ref 30.0–36.0)
MCV: 93.9 fL (ref 80.0–100.0)
Platelets: 169 10*3/uL (ref 150–400)
RBC: 3.62 MIL/uL — ABNORMAL LOW (ref 3.87–5.11)
RDW: 12.6 % (ref 11.5–15.5)
WBC: 16.2 10*3/uL — ABNORMAL HIGH (ref 4.0–10.5)
nRBC: 0 % (ref 0.0–0.2)

## 2019-11-02 LAB — RPR: RPR Ser Ql: NONREACTIVE

## 2019-11-02 LAB — PTT FACTOR INHIBITOR (MIXING STUDY): aPTT: 24.3 s (ref 22.9–30.2)

## 2019-11-02 MED ORDER — LACTATED RINGERS IV SOLN: INTRAVENOUS | Status: DC

## 2019-11-02 MED ORDER — BISACODYL 10 MG RE SUPP: 10.0000 mg | Freq: Every day | RECTAL | Status: DC | PRN

## 2019-11-02 MED ORDER — SIMETHICONE 80 MG PO CHEW
80.0000 mg | CHEWABLE_TABLET | ORAL | Status: DC
  Administered 2019-11-02 (×2): 80 mg via ORAL
  Filled 2019-11-02 (×2): qty 1

## 2019-11-02 MED ORDER — ZOLPIDEM TARTRATE 5 MG PO TABS: 5.0000 mg | ORAL_TABLET | Freq: Every evening | ORAL | Status: DC | PRN

## 2019-11-02 MED ORDER — SIMETHICONE 80 MG PO CHEW
80.0000 mg | CHEWABLE_TABLET | ORAL | Status: DC | PRN
  Administered 2019-11-02: 80 mg via ORAL
  Filled 2019-11-02: qty 1

## 2019-11-02 MED ORDER — TETANUS-DIPHTH-ACELL PERTUSSIS 5-2.5-18.5 LF-MCG/0.5 IM SUSP
0.5000 mL | Freq: Once | INTRAMUSCULAR | Status: AC
  Administered 2019-11-03: 0.5 mL via INTRAMUSCULAR

## 2019-11-02 MED ORDER — DIBUCAINE (PERIANAL) 1 % EX OINT: 1.0000 "application " | TOPICAL_OINTMENT | CUTANEOUS | Status: DC | PRN

## 2019-11-02 MED ORDER — PRENATAL MULTIVITAMIN CH
1.0000 | ORAL_TABLET | Freq: Every day | ORAL | Status: DC
  Administered 2019-11-02: 1 via ORAL
  Filled 2019-11-02: qty 1

## 2019-11-02 MED ORDER — WITCH HAZEL-GLYCERIN EX PADS: 1.0000 "application " | MEDICATED_PAD | CUTANEOUS | Status: DC | PRN

## 2019-11-02 MED ORDER — SIMETHICONE 80 MG PO CHEW
80.0000 mg | CHEWABLE_TABLET | Freq: Three times a day (TID) | ORAL | Status: DC
  Administered 2019-11-02 – 2019-11-03 (×4): 80 mg via ORAL
  Filled 2019-11-02 (×4): qty 1

## 2019-11-02 MED ORDER — SENNOSIDES-DOCUSATE SODIUM 8.6-50 MG PO TABS
2.0000 | ORAL_TABLET | ORAL | Status: DC
  Administered 2019-11-02 (×2): 2 via ORAL
  Filled 2019-11-02 (×2): qty 2

## 2019-11-02 MED ORDER — MENTHOL 3 MG MT LOZG
1.0000 | LOZENGE | OROMUCOSAL | Status: DC | PRN
  Filled 2019-11-02: qty 9

## 2019-11-02 MED ORDER — COCONUT OIL OIL: 1.0000 "application " | TOPICAL_OIL | Status: DC | PRN

## 2019-11-02 MED ORDER — FLEET ENEMA 7-19 GM/118ML RE ENEM: 1.0000 | ENEMA | Freq: Every day | RECTAL | Status: DC | PRN

## 2019-11-02 NOTE — Progress Notes (Signed)
POD #1 Repeat CS. Pregnancy complicated by insufficient care, IUGR, tobacco use, Suboxone use, cocaine use. Subjective:  Doing well. Tolerating a regular diet. Passing flatus. Has voided 250 ml since her foley was discontinued this AM. Bleeding slowing. Pain mostly on left side of incision. Taking Tylenol and ibuprofen. OOB to NICU to see baby.  Seen by transitions team. Baby with +cocaine in urine.   Objective:  Blood pressure 128/88, pulse 81, temperature 98 F (36.7 C), temperature source Oral, resp. rate 18, height 4\' 11"  (1.499 m), weight 48.1 kg, last menstrual period 12/24/2018, SpO2 97 %, unknown if currently breastfeeding.  General: NAD, sitting up in bed talking with older children on phone. Pulmonary: no increased work of breathing/ CTAB Heart: RRR without murmur Abdomen: non-distended, non-tender, bowel sounds active Incision: Honeycomb dressing C&D&I, ON Q intact Extremities: no edema, no erythema, no tenderness  Results for orders placed or performed during the hospital encounter of 11/01/19 (from the past 72 hour(s))  Urine Drug Screen, Qualitative ([facility] only)     Status: Abnormal   Collection Time: 11/01/19 11:59 AM  Result Value Ref Range   Tricyclic, Ur Screen NONE DETECTED NONE DETECTED   Amphetamines, Ur Screen NONE DETECTED NONE DETECTED   MDMA (Ecstasy)Ur Screen NONE DETECTED NONE DETECTED   Cocaine Metabolite,Ur [place] POSITIVE (A) NONE DETECTED   Opiate, Ur Screen NONE DETECTED NONE DETECTED   Phencyclidine (PCP) Ur S NONE DETECTED NONE DETECTED   Cannabinoid 50 Ng, Ur  NONE DETECTED NONE DETECTED   Barbiturates, Ur Screen NONE DETECTED NONE DETECTED   Benzodiazepine, Ur Scrn NONE DETECTED NONE DETECTED   Methadone Scn, Ur NONE DETECTED NONE DETECTED    Comment: (NOTE) Tricyclics + metabolites, urine    Cutoff 1000 ng/mL Amphetamines + metabolites, urine  Cutoff 1000 ng/mL MDMA (Ecstasy), urine              Cutoff 500 ng/mL Cocaine Metabolite, urine           Cutoff 300 ng/mL Opiate + metabolites, urine        Cutoff 300 ng/mL Phencyclidine (PCP), urine         Cutoff 25 ng/mL Cannabinoid, urine                 Cutoff 50 ng/mL Barbiturates + metabolites, urine  Cutoff 200 ng/mL Benzodiazepine, urine              Cutoff 200 ng/mL Methadone, urine                   Cutoff 300 ng/mL The urine drug screen provides only a preliminary, unconfirmed analytical test result and should not be used for non-medical purposes. Clinical consideration and professional judgment should be applied to any positive drug screen result due to possible interfering substances. A more specific alternate chemical method must be used in order to obtain a confirmed analytical result. Gas chromatography / mass spectrometry (GC/MS) is the preferred confirmat ory method. Performed at [facility], [address] [place], [place] [address]   CBC     Status: Abnormal   Collection Time: 11/01/19 12:21 PM  Result Value Ref Range   WBC 6.7 4.0 - 10.5 K/uL   RBC 3.80 (L) 3.87 - 5.11 MIL/uL   Hemoglobin 11.8 (L) 12.0 - 15.0 g/dL   HCT 35.3 (L) 36.0 - 46.0 %   MCV 92.9 80.0 - 100.0 fL   MCH 31.1 26.0 - 34.0 pg   MCHC 33.4 30.0 - 36.0  g/dL   RDW 16.1 09.6 - 04.5 %   Platelets 192 150 - 400 K/uL   nRBC 0.0 0.0 - 0.2 %    Comment: Performed at [facility], [address] [place], [place] [address]  Kleihauer-Betke stain     Status: None   Collection Time: 11/01/19 12:21 PM  Result Value Ref Range   Fetal Cells % 0 %   Quantitation Fetal Hemoglobin 0.0000 mL    Comment: UP TO   # Vials RhIg NOT INDICATED     Comment: Performed at [facility], [address] [place], [place] [address]  Type and screen [facility]     Status: None   Collection Time: 11/01/19 12:21 PM  Result Value Ref Range   ABO/RH(D) O POS    Antibody Screen NEG    Sample Expiration      11/04/2019,2359 Performed at [facility],  [address] [place], [place] [address]   Comprehensive metabolic panel     Status: Abnormal   Collection Time: 11/01/19 12:21 PM  Result Value Ref Range   Sodium 132 (L) 135 - 145 mmol/L   Potassium 3.9 3.5 - 5.1 mmol/L   Chloride 101 98 - 111 mmol/L   CO2 23 22 - 32 mmol/L   Glucose, Bld 80 70 - 99 mg/dL   BUN 15 6 - 20 mg/dL   Creatinine, Ser 2.13 0.44 - 1.00 mg/dL   Calcium 8.7 (L) 8.9 - 10.3 mg/dL   Total Protein 7.2 6.5 - 8.1 g/dL   Albumin 3.0 (L) 3.5 - 5.0 g/dL   AST 18 15 - 41 U/L   ALT 13 0 - 44 U/L   Alkaline Phosphatase 288 (H) 38 - 126 U/L   Total Bilirubin 0.6 0.3 - 1.2 mg/dL   GFR calc non Af Amer >60 >60 mL/min   GFR calc Af Amer >60 >60 mL/min   Anion gap 8 5 - 15    Comment: Performed at [facility], [address] [place], [place] [address]  Hepatitis C antibody     Status: None   Collection Time: 11/01/19 12:21 PM  Result Value Ref Range   HCV Ab NON REACTIVE NON REACTIVE    Comment: (NOTE) Nonreactive HCV antibody screen is consistent with no HCV infections,  unless recent infection is suspected or other evidence exists to indicate HCV infection. Performed at [facility], [address], [place], [place] [address]   Rapid HIV screen (HIV 1/2 Ab+Ag) ([facility] Only)     Status: None   Collection Time: 11/01/19 12:21 PM  Result Value Ref Range   HIV-1 P24 Antigen - HIV24 NON REACTIVE NON REACTIVE    Comment: (NOTE) Detection of p24 may be inhibited by biotin in the sample, causing false negative results in acute infection.    HIV 1/2 Antibodies NON REACTIVE NON REACTIVE   Interpretation (HIV Ag Ab)      A non reactive test result means that HIV 1 or HIV 2 antibodies and HIV 1 p24 antigen were not detected in the specimen.    Comment: Performed at [facility], [address] [place], [place] [address]  Group B strep by PCR     Status: None   Collection Time: 11/01/19 12:28 PM   Specimen: Urine, Clean Catch; Genital   Result Value Ref Range   Group B strep by PCR NEGATIVE NEGATIVE    Comment: (NOTE) Intrapartum testing with Xpert  GBS assay should be used as an adjunct to other methods available and not used to replace antepartum testing (at 35-[redacted] weeks gestation). Performed at [facility], [address] [place], [place] [address]   Chlamydia/NGC rt PCR [facility] only)     Status: None   Collection Time: 11/01/19 12:28 PM   Specimen: Urine, Clean Catch  Result Value Ref Range   Specimen source GC/Chlam ENDOCERVICAL    Chlamydia Tr NOT DETECTED NOT DETECTED   N gonorrhoeae NOT DETECTED NOT DETECTED    Comment: (NOTE) This CT/NG assay has not been evaluated in patients with a history of  hysterectomy. Performed at [facility], [address] [place], [place] [address]   PTT factor inhibitor (mixing study)     Status: None   Collection Time: 11/01/19 12:37 PM  Result Value Ref Range   aPTT 24.3 22.9 - 30.2 sec    Comment: (NOTE) The aPTT is normal so plasma mixing tests are not indicated. Performed At: [facility] [address] [place], [place] [id number] Jolene SchimkeNagendra Sanjai MD KV:[phone]    aPTT 1:1 Normal Plasma NOT PERFORMED     Comment: Test not performed   aPTT 1:1 Mix Saline NOT PERFORMED     Comment: Test not performed   aPTT 1:1 NP Mix, 60 Min,Incub. NOT PERFORMED     Comment: Test not performed  Protime-INR     Status: None   Collection Time: 11/01/19 12:37 PM  Result Value Ref Range   Prothrombin Time 11.5 11.4 - 15.2 seconds   INR 0.9 0.8 - 1.2    Comment: (NOTE) INR goal varies based on device and disease states. Performed at [facility], [address] [place], [place] [address]   Fibrinogen     Status: Abnormal   Collection Time: 11/01/19 12:37 PM  Result Value Ref Range   Fibrinogen 554 (H) 210 - 475 mg/dL    Comment: Performed at [facility], [address] [place], [place] [address]  RPR     Status: None    Collection Time: 11/01/19 12:37 PM  Result Value Ref Range   RPR Ser Ql NON REACTIVE NON REACTIVE    Comment: Performed at [facility], [address] [place], [place] [address]  Respiratory Panel by RT PCR (Flu A&B, Covid) - Urine, Clean Catch     Status: None   Collection Time: 11/01/19 12:59 PM   Specimen: Urine, Clean Catch  Result Value Ref Range   SARS Coronavirus 2 by RT PCR NEGATIVE NEGATIVE    Comment: (NOTE) SARS-CoV-2 target nucleic acids are NOT DETECTED. The SARS-CoV-2 RNA is generally detectable in upper respiratoy specimens during the acute phase of infection. The lowest concentration of SARS-CoV-2 viral copies this assay can detect is 131 copies/mL. A negative result does not preclude SARS-Cov-2 infection and should not be used as the sole basis for treatment or other patient management decisions. A negative result may occur with  improper specimen collection/handling, submission of specimen other than nasopharyngeal swab, presence of viral mutation(s) within the areas targeted by this assay, and inadequate number of viral copies (<131 copies/mL). A negative result must be combined with clinical observations, patient history, and epidemiological information. The expected result is Negative. Fact Sheet for Patients:  https://www.moore.com/https://www.fda.gov/media/142436/download Fact Sheet for Healthcare Providers:  https://www.young.biz/https://www.fda.gov/media/142435/download This test is not yet ap proved or cleared by the [place] FDA and  has been authorized for detection and/or diagnosis of SARS-CoV-2 by FDA under  an Emergency Use Authorization (EUA). This EUA will remain  in effect (meaning this test can be used) for the duration of the COVID-19 declaration under Section 564(b)(1) of the Act, 21 U.S.C. section 360bbb-3(b)(1), unless the authorization is terminated or revoked sooner.    Influenza A by PCR NEGATIVE NEGATIVE   Influenza B by PCR NEGATIVE NEGATIVE    Comment: (NOTE) The  Xpert Xpress SARS-CoV-2/FLU/RSV assay is intended as an aid in  the diagnosis of influenza from Nasopharyngeal swab specimens and  should not be used as a sole basis for treatment. Nasal washings and  aspirates are unacceptable for Xpert Xpress SARS-CoV-2/FLU/RSV  testing. Fact Sheet for Patients: https://www.moore.com/ Fact Sheet for Healthcare Providers: https://www.young.biz/ This test is not yet approved or cleared by the [place] FDA and  has been authorized for detection and/or diagnosis of SARS-CoV-2 by  FDA under an Emergency Use Authorization (EUA). This EUA will remain  in effect (meaning this test can be used) for the duration of the  Covid-19 declaration under Section 564(b)(1) of the Act, 21  U.S.C. section 360bbb-3(b)(1), unless the authorization is  terminated or revoked. Performed at [facility], [address] [place], [place] [address]   CBC     Status: Abnormal   Collection Time: 11/02/19  6:55 AM  Result Value Ref Range   WBC 16.2 (H) 4.0 - 10.5 K/uL   RBC 3.62 (L) 3.87 - 5.11 MIL/uL   Hemoglobin 11.5 (L) 12.0 - 15.0 g/dL   HCT 99.3 (L) 57.0 - 17.7 %   MCV 93.9 80.0 - 100.0 fL   MCH 31.8 26.0 - 34.0 pg   MCHC 33.8 30.0 - 36.0 g/dL   RDW 93.9 03.0 - 09.2 %   Platelets 169 150 - 400 K/uL   nRBC 0.0 0.0 - 0.2 %    Comment: Performed at [facility], [address], [place], [place] [address]     Assessment:   28 y.o. [id number] postoperativeday # 1-stable  DC IV later today if voiding and tolerating diet  OOB to ambulate  Continue prenatal vitamins   Plan:  1) O POS/ RI/ VI  2) TDAP -upon discharge   3) Breast/Bottle/Contraception-?  4) Disposition-possible discharge tomorrow  Farrel Conners, CNM

## 2019-11-02 NOTE — TOC Initial Note (Signed)
Transition of Care [facility]) - Initial/Assessment Note    Patient Details  Name: Lori Pollard MRN: [id number] Date of Birth: January 27, 1991  Transition [facility] Care [facility]) CM/SW Contact:    Surprise Cellar, RN Phone Number: 11/02/2019, 12:53 PM  Clinical Narrative:                 28 year old female delivered female infant 34 weeks positive for Cocaine use. States she lives home with her SO, mother in law and 2 other children ages 28 and 41. Patient states infant is in NICU. Infant was 3lb 6oz at birth. Mother received prenatal care at [facility]. Patient states she will sign up for [facility] after discharge. Patient is working with family to get car seat purchased and states she has no other needs at this time. Patient is aware that CPS referral will be made due to substance abuse. Patient very engaged and open to questions.   Expected Discharge Plan: Home/Self Care     Patient Goals and CMS Choice Patient states their goals for this hospitalization and ongoing recovery are:: get home      Expected Discharge Plan and Services Expected Discharge Plan: Home/Self Care       Living arrangements for the past 2 months: Single Family Home                                      Prior Living Arrangements/Services Living arrangements for the past 2 months: Single Family Home Lives with:: Minor Children, Parents, Significant Other Patient language and need for interpreter reviewed:: Yes Do you feel safe going back to the place where you live?: Yes      Need for Family Participation in Patient Care: Yes (Comment) Care giver support system in place?: Yes (comment)   Criminal Activity/Legal Involvement Pertinent to Current Situation/Hospitalization: Yes - Comment as needed(CPS referral made due to drug abuse)  Activities of Daily Living Home Assistive Devices/Equipment: None ADL Screening (condition at time of admission) Patient's cognitive ability adequate to safely complete daily  activities?: Yes Is the patient deaf or have difficulty hearing?: No Does the patient have difficulty seeing, even when wearing glasses/contacts?: No Does the patient have difficulty concentrating, remembering, or making decisions?: No Patient able to express need for assistance with ADLs?: Yes Does the patient have difficulty dressing or bathing?: No Independently performs ADLs?: Yes (appropriate for developmental age) Does the patient have difficulty walking or climbing stairs?: No Weakness of Legs: None Weakness of Arms/Hands: None  Permission Sought/Granted Permission sought to share information with : Other (comment)(CPS referral made)                Emotional Assessment Appearance:: Appears stated age Attitude/Demeanor/Rapport: Engaged Affect (typically observed): Accepting Orientation: : Oriented to Self, Oriented to Place, Oriented to  Time, Oriented to Situation Alcohol / Substance Use: Tobacco Use, Illicit Drugs(Smokes 3 cigarettes daily, (+) Cocaine) Psych Involvement: No (comment)  Admission diagnosis:  Vaginal bleeding in pregnancy [O46.90] [redacted] weeks gestation of pregnancy [Z3A.34] Patient Active Problem List   Diagnosis Date Noted  . Vaginal bleeding in pregnancy 11/01/2019  . [redacted] weeks gestation of pregnancy 11/01/2019  . 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 History of cesarean delivery 07/14/2019  . Suboxone maintenance treatment complicating pregnancy, antepartum (HCC) 07/14/2019  . Late prenatal care affecting pregnancy in second trimester 07/14/2019  .  Anemia during pregnancy in second trimester 07/14/2019  . Bipolar disorder ([place]) 09/18/2015  . PTSD (post-traumatic stress disorder) 09/18/2015  . RAD (reactive airway disease) with wheezing 09/18/2015  . History of narcotic addiction ([place]) 09/18/2015  . Constipation due to pain medication therapy 09/18/2015  . Cervical pain 02/28/2009    PCP:  Arnetha Courser, MD Pharmacy:   [facility] [address] ([address]), [place] [address] [place]) [place] [address] Phone: [phone] Fax: [phone]     Social Determinants of Health (SDOH) Interventions    Readmission Risk Interventions No flowsheet data found.

## 2019-11-02 NOTE — Progress Notes (Signed)
Edinburgh Postnatal Depression Scale score noted to be 14. Fabian Sharp, CNM at desk and made aware. She states not to place new order for social work. Order for Social Worker is already in orders for pt and social work is following.

## 2019-11-02 NOTE — Progress Notes (Signed)
Patient refused ambulation at this time due to abdominal discomfort. Foley remains in place, clean and intact.

## 2019-11-02 NOTE — Progress Notes (Signed)
Patient currently resting, breathing normal, sleeping when nurse entered the room. Patient states pain is 10/10 in lower abdomen. Patient reports feeling "numb" at incision site but sore "like a bruise" above the incision near On Q site. Patient repositioned for comfort, patient satisfied with pain management at this time. Will continue to monitor.

## 2019-11-02 NOTE — Lactation Note (Addendum)
This note was copied from a baby's chart. Lactation Consultation Note  Patient Name: Lori Pollard Date: 11/02/2019    [facility] checked in with mom after visit to SCN. Mom was sleepy, and was wanting to take a nap before starting to pump for the first time. LC encouraged her to pump as soon as she could to begin the process of milk production, and Lc would check back in before end of shift. RN aware that mom has not pumped yet.  RN spoke with mom this morning. Mom desires to pump and dump her breast milk at this time. Mom tested positive for cocaine on admission, with history of drug abuse. She has a history of pumping and breastfeeding other children, and desires to be able to that this baby.  Pumping kit was given last night after delivery, but has not been used, mom stating she was "too tired" to start last night. LC provided education and demonstrated set-up of pump pieces, frequency, cleaning, and milk supply and demand. Discussed importance of routine of every 2-3 hours to help establish milk supply; and discussed follow-up with baby girls doctor and well as her PCP before giving any expressed milk to baby. Mom again reiterates her plan to pump and dump "for now", until she is told that baby can have her breast milk.  [place] number provided. Encouraged to call out with questions/concerns.  Maternal Data    Feeding    LATCH Score                   Interventions    Lactation Tools Discussed/Used     Consult Status      Lori Pollard 11/02/2019, 11:01 AM

## 2019-11-02 NOTE — Anesthesia Postprocedure Evaluation (Signed)
Anesthesia Post Note  Patient: Lori Pollard  Procedure(s) Performed: CESAREAN SECTION (N/A )  Patient location during evaluation: Mother Baby Anesthesia Type: Spinal Level of consciousness: oriented and awake and alert Pain management: pain level controlled Vital Signs Assessment: post-procedure vital signs reviewed and stable Respiratory status: spontaneous breathing and respiratory function stable Cardiovascular status: blood pressure returned to baseline and stable Postop Assessment: no headache, no backache, no apparent nausea or vomiting and able to ambulate Anesthetic complications: no     Last Vitals:  Vitals:   11/02/19 0910 11/02/19 0923  BP:    Pulse: 60   Resp:    Temp:    SpO2:  98%    Last Pain:  Vitals:   11/02/19 0910  TempSrc:   PainSc: Asleep                 Hedda Slade

## 2019-11-02 NOTE — Progress Notes (Signed)
Glenard Haring with [employer] called to make aware of order for transition of care for pt. Angel to review order and call back.

## 2019-11-02 NOTE — Care Management (Signed)
RN CM: Left VM for CPS hotline @ [phone].

## 2019-11-03 ENCOUNTER — Ambulatory Visit: Payer: Self-pay

## 2019-11-03 DIAGNOSIS — Z98891 History of uterine scar from previous surgery: Secondary | ICD-10-CM

## 2019-11-03 MED ORDER — MEDROXYPROGESTERONE ACETATE 150 MG/ML IM SUSP
150.0000 mg | Freq: Once | INTRAMUSCULAR | Status: AC
  Administered 2019-11-03: 150 mg via INTRAMUSCULAR
  Filled 2019-11-03: qty 1

## 2019-11-03 MED ORDER — IBUPROFEN 800 MG PO TABS: 800.0000 mg | ORAL_TABLET | Freq: Three times a day (TID) | ORAL | 0 refills | Status: DC

## 2019-11-03 MED ORDER — HYDROCODONE-ACETAMINOPHEN 5-325 MG PO TABS: 1.0000 | ORAL_TABLET | Freq: Four times a day (QID) | ORAL | 0 refills | Status: DC | PRN

## 2019-11-03 NOTE — Lactation Note (Signed)
This note was copied from a baby's chart. Lactation Consultation Note  Patient Name: Lori Pollard Today's Date: 11/03/2019     Maternal Data    Feeding Feeding Type: Donor Breast Milk  LATCH Score                   Interventions    Lactation Tools Discussed/Used Tools: 23F feeding tube / Syringe;Pump   Consult Status  Lactation talked with mother about feeding decision. Mother states that she has decided to formula feed infant but is interested in breastfeeding in the future. Mother explained the risks of breastfeeding and substance abuse and gave mother a hand pump to pump and dump her milk. Lactation strongly urged mother to seek support from [facility] as well as her treatment facility to aid in following treatment plan.    [name] 09/81/1914, 12:48 PM

## 2019-11-03 NOTE — Progress Notes (Signed)
Pt discharged with infant in SCN. Discharge instructions, prescriptions, and follow up appointments given to and reviewed with patient. Pt verbalized understanding. Pt refused wheelchair, ambulated to car.

## 2019-11-08 LAB — SURGICAL PATHOLOGY

## 2019-12-02 ENCOUNTER — Ambulatory Visit: Payer: Medicaid Other | Admitting: Obstetrics and Gynecology

## 2020-05-11 ENCOUNTER — Emergency Department: Payer: Medicaid Other

## 2020-05-11 ENCOUNTER — Other Ambulatory Visit: Payer: Self-pay

## 2020-05-11 ENCOUNTER — Encounter: Payer: Self-pay | Admitting: Emergency Medicine

## 2020-05-11 ENCOUNTER — Emergency Department
Admission: EM | Admit: 2020-05-11 | Discharge: 2020-05-11 | Disposition: A | Discharge status: Home / Self Care | Payer: Medicaid Other | Attending: Emergency Medicine | Admitting: Emergency Medicine

## 2020-05-11 DIAGNOSIS — R0602 Shortness of breath: Secondary | ICD-10-CM | POA: Insufficient documentation

## 2020-05-11 DIAGNOSIS — K1379 Other lesions of oral mucosa: Secondary | ICD-10-CM | POA: Insufficient documentation

## 2020-05-11 DIAGNOSIS — F17210 Nicotine dependence, cigarettes, uncomplicated: Secondary | ICD-10-CM | POA: Insufficient documentation

## 2020-05-11 DIAGNOSIS — T280XXA Burn of mouth and pharynx, initial encounter: Secondary | ICD-10-CM

## 2020-05-11 LAB — CBC WITH DIFFERENTIAL/PLATELET
Abs Immature Granulocytes: 0.03 10*3/uL (ref 0.00–0.07)
Basophils Absolute: 0 10*3/uL (ref 0.0–0.1)
Basophils Relative: 0 %
Eosinophils Absolute: 0.1 10*3/uL (ref 0.0–0.5)
Eosinophils Relative: 1 %
HCT: 36.4 % (ref 36.0–46.0)
Hemoglobin: 12.4 g/dL (ref 12.0–15.0)
Immature Granulocytes: 0 %
Lymphocytes Relative: 20 %
Lymphs Abs: 1.9 10*3/uL (ref 0.7–4.0)
MCH: 32.5 pg (ref 26.0–34.0)
MCHC: 34.1 g/dL (ref 30.0–36.0)
MCV: 95.3 fL (ref 80.0–100.0)
Monocytes Absolute: 0.8 10*3/uL (ref 0.1–1.0)
Monocytes Relative: 9 %
Neutro Abs: 6.4 10*3/uL (ref 1.7–7.7)
Neutrophils Relative %: 70 %
Platelets: 272 10*3/uL (ref 150–400)
RBC: 3.82 MIL/uL — ABNORMAL LOW (ref 3.87–5.11)
RDW: 13.8 % (ref 11.5–15.5)
WBC: 9.3 10*3/uL (ref 4.0–10.5)
nRBC: 0 % (ref 0.0–0.2)

## 2020-05-11 LAB — BASIC METABOLIC PANEL
Anion gap: 20 — ABNORMAL HIGH (ref 5–15)
BUN: 17 mg/dL (ref 6–20)
CO2: 19 mmol/L — ABNORMAL LOW (ref 22–32)
Calcium: 9.1 mg/dL (ref 8.9–10.3)
Chloride: 96 mmol/L — ABNORMAL LOW (ref 98–111)
Creatinine, Ser: 0.85 mg/dL (ref 0.44–1.00)
GFR calc Af Amer: >60 mL/min (ref >60)
GFR calc non Af Amer: >60 mL/min (ref >60)
Glucose, Bld: 62 mg/dL — ABNORMAL LOW (ref 70–99)
Potassium: 3.2 mmol/L — ABNORMAL LOW (ref 3.5–5.1)
Sodium: 135 mmol/L (ref 135–145)

## 2020-05-11 LAB — GLUCOSE, CAPILLARY: Glucose-Capillary: 96 mg/dL (ref 70–99)

## 2020-05-11 LAB — POCT PREGNANCY, URINE: Preg Test, Ur: NEGATIVE

## 2020-05-11 LAB — TROPONIN I (HIGH SENSITIVITY): Troponin I (High Sensitivity): <2 ng/L (ref <18)

## 2020-05-11 MED ORDER — AMOXICILLIN-POT CLAVULANATE 875-125 MG PO TABS: 1.0000 | ORAL_TABLET | Freq: Two times a day (BID) | ORAL | 0 refills | Status: AC

## 2020-05-11 MED ORDER — POTASSIUM CHLORIDE CRYS ER 20 MEQ PO TBCR
40.0000 meq | EXTENDED_RELEASE_TABLET | Freq: Once | ORAL | Status: AC
  Administered 2020-05-11: 40 meq via ORAL
  Filled 2020-05-11: qty 2

## 2020-05-11 MED ORDER — OXYCODONE-ACETAMINOPHEN 5-325 MG PO TABS
1.0000 | ORAL_TABLET | Freq: Once | ORAL | Status: AC
  Administered 2020-05-11: 1 via ORAL
  Filled 2020-05-11: qty 1

## 2020-05-11 MED ORDER — IPRATROPIUM-ALBUTEROL 0.5-2.5 (3) MG/3ML IN SOLN
3.0000 mL | Freq: Once | RESPIRATORY_TRACT | Status: AC
  Administered 2020-05-11: 3 mL via RESPIRATORY_TRACT
  Filled 2020-05-11: qty 3

## 2020-05-11 NOTE — ED Provider Notes (Signed)
[facility] Emergency Department Provider Note   ____________________________________________   First MD Initiated Contact with Patient 05/11/20 1135     (approximate)  I have reviewed the triage vital signs and the nursing notes.   HISTORY  Chief Complaint Shortness of Breath    HPI Lori Pollard is a 29 y.o. female with possible history of polysubstance abuse who presents to the ED complaining of shortness of breath and facial pain.  Patient reports that she has been dealing with a nonproductive cough as well as shortness of breath and tightness in her chest for about the past week.  She denies any fevers, nausea, vomiting, diarrhea, or sick contacts.  She does admit to smoking crack cocaine on a regular basis but denies any IV drug use, states she has been clean from IV use for least 6 months.  She also reports waking up today with pain and swelling around her lower lip.  She is concerned that she could have an infection on her lip or an allergic reaction due to the swelling and pain, which extends into her left cheek.  She has a left upper molar that she is concerned is infected, although pain is primarily limited to her lip.  She denies burning her mouth while smoking crack cocaine.        Past Medical History:  Diagnosis Date  . Cervicalgia   . History of chickenpox   . Hives     Patient Active Problem List   Diagnosis Date Noted  . Status post cesarean delivery 11/03/2019  . Vaginal bleeding in pregnancy 11/01/2019  . [redacted] weeks gestation of pregnancy 11/01/2019  . 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 History of cesarean delivery 07/14/2019  . Suboxone maintenance treatment complicating pregnancy, antepartum (HCC) 07/14/2019  . Late prenatal care affecting pregnancy in second trimester 07/14/2019  . Anemia during pregnancy in second trimester 07/14/2019  . Bipolar  disorder (HCC) 09/18/2015  . PTSD (post-traumatic stress disorder) 09/18/2015  . RAD (reactive airway disease) with wheezing 09/18/2015  . History of narcotic addiction (HCC) 09/18/2015  . Constipation due to pain medication therapy 09/18/2015  . Cervical pain 02/28/2009    Past Surgical History:  Procedure Laterality Date  . CESAREAN SECTION  2013  . CESAREAN SECTION  2015  . CESAREAN SECTION N/A 11/01/2019   Procedure: CESAREAN SECTION;  Surgeon: Natale Milch, MD;  Location: [facility] ORS;  Service: Obstetrics;  Laterality: N/A;    Prior to Admission medications   Medication Sig Start Date End Date Taking? Authorizing Provider  albuterol (PROVENTIL) (2.5 MG/3ML) 0.083% nebulizer solution Inhale into the lungs. 09/11/14   [provider]  amoxicillin-clavulanate (AUGMENTIN) 875-125 MG tablet Take 1 tablet by mouth 2 (two) times daily for 7 days. 05/11/20 05/18/20  Chesley Noon, MD  buprenorphine (SUBUTEX) 8 MG SUBL SL tablet PLACE 1 TABLET UNDER THE TONGUE TWICE DAILY AND ONE HALF AT MIDDAY. LAST REFILL UNTIL SEEN 07/30/19   [provider]  Fe Cbn-Fe Gluc-FA-B12-C-DSS (FERRALET 90) 90-1 MG TABS Take 1 tablet by mouth daily. Patient not taking: Reported on 11/01/2019 09/07/19   Conard Novak, MD  HYDROcodone-acetaminophen [facility]) 5-325 MG tablet Take 1 tablet by mouth every 6 (six) hours as needed for moderate pain. 11/03/19   Conard Novak, MD  ibuprofen (ADVIL) 800 MG tablet Take 1 tablet (800 mg total) by mouth every 8 (eight) hours. 11/03/19   Conard Novak,  MD    Allergies Patient has no known allergies.  Family History  Problem Relation Age of Onset  . Alcohol abuse Mother   . GER disease Mother   . Depression Mother   . Alcohol abuse Father   . Emphysema Father   . Diabetes Maternal Grandmother   . Heart disease Maternal Grandmother   . Hyperlipidemia Maternal Grandmother   . Hypertension Maternal Grandmother     Social  History Social History   Tobacco Use  . Smoking status: Current Every Day Smoker    Packs/day: 0.50    Years: 11.00    Pack years: 5.50    Types: Cigarettes    Start date: 09/17/2004  . Smokeless tobacco: Never Used  Vaping Use  . Vaping Use: Never used  Substance Use Topics  . Alcohol use: No    Alcohol/week: 0.0 standard drinks  . Drug use: No    Review of Systems  Constitutional: No fever/chills Eyes: No visual changes. ENT: No sore throat.  Positive for lip and facial pain. Cardiovascular: Positive for chest pain. Respiratory: Positive for cough and shortness of breath. Gastrointestinal: No abdominal pain.  No nausea, no vomiting.  No diarrhea.  No constipation. Genitourinary: Negative for dysuria. Musculoskeletal: Negative for back pain. Skin: Negative for rash. Neurological: Negative for headaches, focal weakness or numbness.  ____________________________________________   PHYSICAL EXAM:  VITAL SIGNS: ED Triage Vitals [05/11/20 1134]  Enc Vitals Group     BP 102/72     Pulse Rate 92     Resp 16     Temp 98.1 F (36.7 C)     Temp Source Oral     SpO2 99 %     Weight 106 lb 0.7 oz (48.1 kg)     Height 4\' 11"  (1.499 m)     Head Circumference      Peak Flow      Pain Score 0     Pain Loc      Pain Edu?      Excl. in GC?     Constitutional: Alert and oriented. Eyes: Conjunctivae are normal. Head: Atraumatic. Nose: No congestion/rhinnorhea. Mouth/Throat: Mucous membranes are moist.  Ulceration similar to burn to anterior portion of left lateral lower lip extending to a very small portion of her oral mucosa but not to her gums.  Broken upper molar with mild erythema but no fluctuance or drainage. Neck: Normal ROM Cardiovascular: Normal rate, regular rhythm. Grossly normal heart sounds. Respiratory: Normal respiratory effort.  No retractions. Lungs CTAB. Gastrointestinal: Soft and nontender. No distention. Genitourinary: deferred Musculoskeletal: No  lower extremity tenderness nor edema. Neurologic:  Normal speech and language. No gross focal neurologic deficits are appreciated. Skin:  Skin is warm, dry and intact. No rash noted. Psychiatric: Mood and affect are normal. Speech and behavior are normal.  ____________________________________________   LABS (all labs ordered are listed, but only abnormal results are displayed)  Labs Reviewed  BASIC METABOLIC PANEL - Abnormal; Notable for the following components:      Result Value   Potassium 3.2 (*)    Chloride 96 (*)    CO2 19 (*)    Glucose, Bld 62 (*)    Anion gap 20 (*)    All other components within normal limits  CBC WITH DIFFERENTIAL/PLATELET - Abnormal; Notable for the following components:   RBC 3.82 (*)    All other components within normal limits  GLUCOSE, CAPILLARY  POC URINE PREG, ED  POCT PREGNANCY, URINE  TROPONIN I (HIGH SENSITIVITY)   ____________________________________________  EKG  ED ECG REPORT I, Chesley Noon, the attending physician, personally viewed and interpreted this ECG.   Date: 05/11/2020  EKG Time: 11:56  Rate: 79  Rhythm: normal sinus rhythm  Axis: Normal  Intervals:none  ST&T Change: None   PROCEDURES  Procedure(s) performed (including Critical Care):  Procedures   ____________________________________________   INITIAL IMPRESSION / ASSESSMENT AND PLAN / ED COURSE       29 year old female with past medical history of polysubstance abuse presents to the ED complaining of cough, shortness of breath, and chest tightness for at least the past week along with lip swelling and pain since this morning.  She is not in any respiratory distress here in the ED, maintaining O2 sats on room air.  Lung sounds are clear and chest x-ray is negative for acute process.  I suspect she has some pneumonitis due to her crack cocaine use, low suspicion for PE given she is PERC negative.  EKG shows no evidence of arrhythmia or ischemia, low  suspicion for ACS but we will screen troponin and basic labs.  We will treat symptomatically with DuoNeb for presumed pneumonitis, chest x-ray pending.  Her area of pain and swelling to her left lower lip appears consistent with superficial burn likely associated with drug use.  She additionally has a infected appearing tooth with no associated abscess and we will plan to cover with antibiotics for oral flora.  No evidence of allergic reaction or infectious process tracking into her submandibular compartments or face.  Lab work is unremarkable, chest x-ray negative for acute process. Patient reports shortness of breath is improved following DuoNeb. Shortness of breath work-up is unremarkable and patient is appropriate for discharge home. We will start her on course of antibiotics for apparent mouth burn as well as dental infection. She was counseled to follow-up with her PCP as well as her dentist, counseled to return to the ED for new or worsening symptoms. Patient agrees with plan.      ____________________________________________   FINAL CLINICAL IMPRESSION(S) / ED DIAGNOSES  Final diagnoses:  Burn of mouth, initial encounter  Shortness of breath     ED Discharge Orders         Ordered    amoxicillin-clavulanate (AUGMENTIN) 875-125 MG tablet  2 times daily     Discontinue  Reprint     05/11/20 1611           Note:  This document was prepared using Dragon voice recognition software and may include unintentional dictation errors.   Chesley Noon, MD 05/11/20 [phone]

## 2020-05-11 NOTE — ED Notes (Addendum)
Pt given crackers and orange juice 

## 2020-05-11 NOTE — ED Triage Notes (Signed)
C/O difficulty breathing x 1 week.  States has been seen at [facility] one week ago for same.  States noticed lower lip swelling today.  AAOx3.  Skin warm and dry.  Voice hoarse and strong.  Respirations regular and non labored.

## 2020-06-20 ENCOUNTER — Other Ambulatory Visit: Payer: Self-pay

## 2020-06-20 ENCOUNTER — Inpatient Hospital Stay
Admission: EM | Admit: 2020-06-20 | Discharge: 2020-06-29 | DRG: 854 | Disposition: A | Discharge status: Home / Self Care | Payer: Medicaid Other | Attending: Family Medicine | Admitting: Family Medicine

## 2020-06-20 ENCOUNTER — Inpatient Hospital Stay: Payer: Medicaid Other

## 2020-06-20 DIAGNOSIS — Z833 Family history of diabetes mellitus: Secondary | ICD-10-CM | POA: Diagnosis not present

## 2020-06-20 DIAGNOSIS — F4310 Post-traumatic stress disorder, unspecified: Secondary | ICD-10-CM | POA: Diagnosis present

## 2020-06-20 DIAGNOSIS — L02415 Cutaneous abscess of right lower limb: Secondary | ICD-10-CM | POA: Diagnosis present

## 2020-06-20 DIAGNOSIS — Z79899 Other long term (current) drug therapy: Secondary | ICD-10-CM | POA: Diagnosis not present

## 2020-06-20 DIAGNOSIS — L03115 Cellulitis of right lower limb: Secondary | ICD-10-CM | POA: Diagnosis present

## 2020-06-20 DIAGNOSIS — Z791 Long term (current) use of non-steroidal anti-inflammatories (NSAID): Secondary | ICD-10-CM

## 2020-06-20 DIAGNOSIS — L089 Local infection of the skin and subcutaneous tissue, unspecified: Secondary | ICD-10-CM | POA: Diagnosis not present

## 2020-06-20 DIAGNOSIS — F1910 Other psychoactive substance abuse, uncomplicated: Secondary | ICD-10-CM

## 2020-06-20 DIAGNOSIS — Z825 Family history of asthma and other chronic lower respiratory diseases: Secondary | ICD-10-CM

## 2020-06-20 DIAGNOSIS — Z811 Family history of alcohol abuse and dependence: Secondary | ICD-10-CM | POA: Diagnosis not present

## 2020-06-20 DIAGNOSIS — Z79891 Long term (current) use of opiate analgesic: Secondary | ICD-10-CM

## 2020-06-20 DIAGNOSIS — Z818 Family history of other mental and behavioral disorders: Secondary | ICD-10-CM | POA: Diagnosis not present

## 2020-06-20 DIAGNOSIS — E440 Moderate protein-calorie malnutrition: Secondary | ICD-10-CM | POA: Diagnosis present

## 2020-06-20 DIAGNOSIS — B955 Unspecified streptococcus as the cause of diseases classified elsewhere: Secondary | ICD-10-CM | POA: Diagnosis present

## 2020-06-20 DIAGNOSIS — L0390 Cellulitis, unspecified: Secondary | ICD-10-CM | POA: Diagnosis not present

## 2020-06-20 DIAGNOSIS — F1110 Opioid abuse, uncomplicated: Secondary | ICD-10-CM | POA: Diagnosis present

## 2020-06-20 DIAGNOSIS — F319 Bipolar disorder, unspecified: Secondary | ICD-10-CM | POA: Diagnosis present

## 2020-06-20 DIAGNOSIS — R079 Chest pain, unspecified: Secondary | ICD-10-CM | POA: Diagnosis not present

## 2020-06-20 DIAGNOSIS — A4101 Sepsis due to Methicillin susceptible Staphylococcus aureus: Secondary | ICD-10-CM | POA: Diagnosis present

## 2020-06-20 DIAGNOSIS — E876 Hypokalemia: Secondary | ICD-10-CM | POA: Diagnosis present

## 2020-06-20 DIAGNOSIS — A419 Sepsis, unspecified organism: Secondary | ICD-10-CM

## 2020-06-20 DIAGNOSIS — M25569 Pain in unspecified knee: Secondary | ICD-10-CM

## 2020-06-20 DIAGNOSIS — D62 Acute posthemorrhagic anemia: Secondary | ICD-10-CM | POA: Diagnosis present

## 2020-06-20 DIAGNOSIS — T797XXA Traumatic subcutaneous emphysema, initial encounter: Secondary | ICD-10-CM | POA: Diagnosis present

## 2020-06-20 DIAGNOSIS — Z681 Body mass index (BMI) 19 or less, adult: Secondary | ICD-10-CM

## 2020-06-20 DIAGNOSIS — Z8619 Personal history of other infectious and parasitic diseases: Secondary | ICD-10-CM | POA: Diagnosis not present

## 2020-06-20 DIAGNOSIS — A400 Sepsis due to streptococcus, group A: Secondary | ICD-10-CM | POA: Diagnosis not present

## 2020-06-20 DIAGNOSIS — I96 Gangrene, not elsewhere classified: Secondary | ICD-10-CM | POA: Diagnosis present

## 2020-06-20 DIAGNOSIS — Z83438 Family history of other disorder of lipoprotein metabolism and other lipidemia: Secondary | ICD-10-CM

## 2020-06-20 DIAGNOSIS — Z20822 Contact with and (suspected) exposure to covid-19: Secondary | ICD-10-CM | POA: Diagnosis present

## 2020-06-20 DIAGNOSIS — M79604 Pain in right leg: Secondary | ICD-10-CM | POA: Diagnosis present

## 2020-06-20 DIAGNOSIS — Z8249 Family history of ischemic heart disease and other diseases of the circulatory system: Secondary | ICD-10-CM

## 2020-06-20 DIAGNOSIS — F17210 Nicotine dependence, cigarettes, uncomplicated: Secondary | ICD-10-CM | POA: Diagnosis present

## 2020-06-20 LAB — CBC WITH DIFFERENTIAL/PLATELET
Abs Immature Granulocytes: 0.11 10*3/uL — ABNORMAL HIGH (ref 0.00–0.07)
Basophils Absolute: 0.1 10*3/uL (ref 0.0–0.1)
Basophils Relative: 0 %
Eosinophils Absolute: 0.2 10*3/uL (ref 0.0–0.5)
Eosinophils Relative: 1 %
HCT: 35.3 % — ABNORMAL LOW (ref 36.0–46.0)
Hemoglobin: 11.6 g/dL — ABNORMAL LOW (ref 12.0–15.0)
Immature Granulocytes: 1 %
Lymphocytes Relative: 9 %
Lymphs Abs: 1.5 10*3/uL (ref 0.7–4.0)
MCH: 31.5 pg (ref 26.0–34.0)
MCHC: 32.9 g/dL (ref 30.0–36.0)
MCV: 95.9 fL (ref 80.0–100.0)
Monocytes Absolute: 0.7 10*3/uL (ref 0.1–1.0)
Monocytes Relative: 4 %
Neutro Abs: 14.3 10*3/uL — ABNORMAL HIGH (ref 1.7–7.7)
Neutrophils Relative %: 85 %
Platelets: 246 10*3/uL (ref 150–400)
RBC: 3.68 MIL/uL — ABNORMAL LOW (ref 3.87–5.11)
RDW: 13.4 % (ref 11.5–15.5)
WBC: 16.8 10*3/uL — ABNORMAL HIGH (ref 4.0–10.5)
nRBC: 0 % (ref 0.0–0.2)

## 2020-06-20 LAB — COMPREHENSIVE METABOLIC PANEL
ALT: 15 U/L (ref 0–44)
AST: 26 U/L (ref 15–41)
Albumin: 3.1 g/dL — ABNORMAL LOW (ref 3.5–5.0)
Alkaline Phosphatase: 81 U/L (ref 38–126)
Anion gap: 13 (ref 5–15)
BUN: 10 mg/dL (ref 6–20)
CO2: 22 mmol/L (ref 22–32)
Calcium: 8.5 mg/dL — ABNORMAL LOW (ref 8.9–10.3)
Chloride: 104 mmol/L (ref 98–111)
Creatinine, Ser: 0.82 mg/dL (ref 0.44–1.00)
GFR calc Af Amer: >60 mL/min (ref >60)
GFR calc non Af Amer: >60 mL/min (ref >60)
Glucose, Bld: 119 mg/dL — ABNORMAL HIGH (ref 70–99)
Potassium: 2.9 mmol/L — ABNORMAL LOW (ref 3.5–5.1)
Sodium: 139 mmol/L (ref 135–145)
Total Bilirubin: 0.6 mg/dL (ref 0.3–1.2)
Total Protein: 6.9 g/dL (ref 6.5–8.1)

## 2020-06-20 LAB — SARS CORONAVIRUS 2 BY RT PCR (HOSPITAL ORDER, PERFORMED IN ~~LOC~~ HOSPITAL LAB): SARS Coronavirus 2: NEGATIVE

## 2020-06-20 LAB — LACTIC ACID, PLASMA: Lactic Acid, Venous: 2.6 mmol/L (ref 0.5–1.9)

## 2020-06-20 MED ORDER — POTASSIUM CHLORIDE CRYS ER 20 MEQ PO TBCR
40.0000 meq | EXTENDED_RELEASE_TABLET | Freq: Once | ORAL | Status: AC
  Administered 2020-06-20: 40 meq via ORAL
  Filled 2020-06-20: qty 2

## 2020-06-20 MED ORDER — SODIUM CHLORIDE 0.9 % IV SOLN: INTRAVENOUS | Status: DC

## 2020-06-20 MED ORDER — SODIUM CHLORIDE 0.9 % IV BOLUS
1000.0000 mL | Freq: Once | INTRAVENOUS | Status: AC
  Administered 2020-06-21: 1000 mL via INTRAVENOUS

## 2020-06-20 MED ORDER — HYDROCODONE-ACETAMINOPHEN 7.5-325 MG PO TABS
1.0000 | ORAL_TABLET | ORAL | Status: DC | PRN
  Administered 2020-06-21: 1 via ORAL
  Filled 2020-06-20: qty 1

## 2020-06-20 MED ORDER — VANCOMYCIN HCL IN DEXTROSE 1-5 GM/200ML-% IV SOLN
1000.0000 mg | Freq: Once | INTRAVENOUS | Status: AC
  Administered 2020-06-21: 1000 mg via INTRAVENOUS
  Filled 2020-06-20 (×2): qty 200

## 2020-06-20 MED ORDER — SODIUM CHLORIDE 0.9 % IV SOLN
2.0000 g | Freq: Three times a day (TID) | INTRAVENOUS | Status: DC
  Administered 2020-06-21 – 2020-06-24 (×11): 2 g via INTRAVENOUS
  Filled 2020-06-20 (×13): qty 2

## 2020-06-20 MED ORDER — MORPHINE SULFATE (PF) 2 MG/ML IV SOLN
1.0000 mg | INTRAVENOUS | Status: DC | PRN
  Administered 2020-06-21: 1 mg via INTRAVENOUS
  Filled 2020-06-20: qty 1

## 2020-06-20 MED ORDER — KETOROLAC TROMETHAMINE 60 MG/2ML IM SOLN
30.0000 mg | Freq: Once | INTRAMUSCULAR | Status: AC
  Administered 2020-06-20: 30 mg via INTRAMUSCULAR
  Filled 2020-06-20: qty 2

## 2020-06-20 MED ORDER — ENOXAPARIN SODIUM 40 MG/0.4ML ~~LOC~~ SOLN
40.0000 mg | SUBCUTANEOUS | Status: DC
  Administered 2020-06-21: 40 mg via SUBCUTANEOUS
  Filled 2020-06-20: qty 0.4

## 2020-06-20 NOTE — ED Triage Notes (Signed)
Pt comes POV with knee abscess. Pt is IV drug user. Redness around knee, up on thigh, and down to shin.

## 2020-06-20 NOTE — ED Notes (Signed)
Christine, RN attempted IV access 1 with no success. PT states usually need ultra sound. IV team has been consulted.  Will give IV ABX as soon as line is established.

## 2020-06-20 NOTE — H&P (Addendum)
History and Physical    Lori Pollard VHQ:[id number] DOB: [birth year] DOA: 06/20/2020  PCP: Patient, No Pcp Per  Patient coming from: Home  I have personally briefly reviewed patient's old medical records in [facility]  Chief Complaint: Worsening pain and redness of the right knee  HPI: Lori Pollard is a 29 y.o. 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  Patient was trying to put Suboxone film that she got from a friend through IV into her right lateral knee about a week and a half ago.  She then noticed a small induration that began to get harder and had brownish drainage.  Then redness spread across to her knee in the past 2 to 3 days and it is now up to her thigh and down to her foot.  She has been having more more trouble walking due to limited flexion of her knee from pain.  She denies any fever.  Denies any nausea or vomiting.  Has had some decreased appetite. She also notes pressure-like midsternal chest pain for the past 2 days that has been constant.  Denies any palpitation or any associated shortness of breath.  Denies that she feels anxious.  Patient reports that she has also done IV heroin and methamphetamine in the past few weeks.  Also has tobacco use.  Denies any cocaine or recent alcohol use.  ED Course: She was afebrile, normotensive but tachycardic on room air. Had leukocytosis of 16.8, mild anemia of 11.6.  Lactate of 2.6.  Potassium of 2.9 and other electrolytes otherwise normal.  Reportedly bedside ultrasound done by ED physician Dr. Derrill Kay there was no noted abscess around IV injection site on the right knee and no significant findings around the right knee.  She was started on Vancomycin.   Review of Systems:  Constitutional: No Weight Change, No Fever ENT/Mouth: No sore throat, No  Rhinorrhea Eyes: No Eye Pain, No Vision Changes Cardiovascular: + Chest Pain, no SOB Respiratory: + Cough, No Sputum, No Wheezing, no Dyspnea  Gastrointestinal: No Nausea, No Vomiting, No Diarrhea, No Constipation, No Pain Genitourinary: no Urinary Incontinence, No Urgency, No Flank Pain Musculoskeletal:+No  Arthralgias, No Myalgias Skin: No Skin Lesions, No Pruritus, Neuro: no Weakness, No Numbness Psych: No Anxiety/Panic, No Depression, no decrease appetite Heme/Lymph: No Bruising, No Bleeding  Past Medical History:  Diagnosis Date  . Cervicalgia   . History of chickenpox   . Hives     Past Surgical History:  Procedure Laterality Date  . CESAREAN SECTION  2013  . CESAREAN SECTION  2015  . CESAREAN SECTION N/A 11/01/2019   Procedure: CESAREAN SECTION;  Surgeon: Natale Milch, MD;  Location: [facility] ORS;  Service: Obstetrics;  Laterality: N/A;     reports that she has been smoking cigarettes. She started smoking about 15 years ago. She has a 5.50 pack-year smoking history. She has never used smokeless tobacco. She reports that she does not drink alcohol and does not use drugs. Social History  No Known Allergies  Family History  Problem Relation Age of Onset  . Alcohol abuse Mother   . GER disease Mother   . Depression Mother   . Alcohol abuse Father   . Emphysema Father   . Diabetes Maternal Grandmother   . Heart disease Maternal Grandmother   . Hyperlipidemia Maternal Grandmother   . Hypertension Maternal Grandmother  Prior to Admission medications   Medication Sig Start Date End Date Taking? Authorizing Provider  albuterol (PROVENTIL) (2.5 MG/3ML) 0.083% nebulizer solution Inhale into the lungs. 09/11/14   [provider]  buprenorphine (SUBUTEX) 8 MG SUBL SL tablet PLACE 1 TABLET UNDER THE TONGUE TWICE DAILY AND ONE HALF AT MIDDAY. LAST REFILL UNTIL SEEN 07/30/19   [provider]  Fe Cbn-Fe Gluc-FA-B12-C-DSS (FERRALET 90) 90-1 MG TABS  Take 1 tablet by mouth daily. Patient not taking: Reported on 11/01/2019 09/07/19   Conard Novak, MD  HYDROcodone-acetaminophen [facility]) 5-325 MG tablet Take 1 tablet by mouth every 6 (six) hours as needed for moderate pain. 11/03/19   Conard Novak, MD  ibuprofen (ADVIL) 800 MG tablet Take 1 tablet (800 mg total) by mouth every 8 (eight) hours. 11/03/19   Conard Novak, MD    Physical Exam: Vitals:   06/20/20 1605 06/20/20 2143 06/20/20 2145  BP: 103/61  105/71  Pulse: (!) 115  (!) 109  Resp: 18  20  Temp: 98.1 F (36.7 C)    TempSrc: Oral    SpO2: 100%  99%  Weight: 48 kg 41.7 kg   Height: 4\' 11"  (1.499 m) 4\' 11"  (1.499 m)     Constitutional: NAD, calm, comfortable, nontoxic appearing thin female laying at 40 degree incline in bed Vitals:   06/20/20 1605 06/20/20 2143 06/20/20 2145  BP: 103/61  105/71  Pulse: (!) 115  (!) 109  Resp: 18  20  Temp: 98.1 F (36.7 C)    TempSrc: Oral    SpO2: 100%  99%  Weight: 48 kg 41.7 kg   Height: 4\' 11"  (1.499 m) 4\' 11"  (1.499 m)    Eyes: PERRL, lids and conjunctivae normal ENMT: Mucous membranes are moist.  Neck: normal, supple Respiratory: clear to auscultation bilaterally, no wheezing, no crackles. Normal respiratory effort on room air. No accessory muscle use.  Cardiovascular: Regular rate and rhythm, no murmurs / rubs / gallops. No extremity edema. 2+ pedal pulses.  No chest wall pain with palpation. Abdomen: no tenderness, no masses palpated.  Bowel sounds positive.  Musculoskeletal: no clubbing / cyanosis.  Healing small ulcerated circular superficial wound of the right lateral knee with surrounding erythema of the right knee spreading distally towards the foot and also proximally up the thigh.  There is pain with light palpation and increased warmth to touch.  Patient able to flex right knee only to about 80 degrees due to intolerance to pain.  Skin: Patient has scattered small healing ulcerated and excoriated wounds  of bilateral forearm and bilateral knee from previous IV injection sites.   Neurologic: CN 2-12 grossly intact. Sensation intact,Strength 5/5 in all 4.  Psychiatric: Normal judgment and insight. Alert and oriented x 3. Normal mood.     Labs on Admission: I have personally reviewed following labs and imaging studies  CBC: Recent Labs  Lab 06/20/20 1607  WBC 16.8*  NEUTROABS 14.3*  HGB 11.6*  HCT 35.3*  MCV 95.9  PLT 246   Basic Metabolic Panel: Recent Labs  Lab 06/20/20 1607  NA 139  K 2.9*  CL 104  CO2 22  GLUCOSE 119*  BUN 10  CREATININE 0.82  CALCIUM 8.5*   GFR: Estimated Creatinine Clearance: 66 mL/min (by C-G formula based on SCr of 0.82 mg/dL). Liver Function Tests: Recent Labs  Lab 06/20/20 1607  AST 26  ALT 15  ALKPHOS 81  BILITOT 0.6  PROT 6.9  ALBUMIN 3.1*  No results for input(s): LIPASE, AMYLASE in the last 168 hours. No results for input(s): AMMONIA in the last 168 hours. Coagulation Profile: No results for input(s): INR, PROTIME in the last 168 hours. Cardiac Enzymes: No results for input(s): CKTOTAL, CKMB, CKMBINDEX, TROPONINI in the last 168 hours. BNP (last 3 results) No results for input(s): PROBNP in the last 8760 hours. HbA1C: No results for input(s): HGBA1C in the last 72 hours. CBG: No results for input(s): GLUCAP in the last 168 hours. Lipid Profile: No results for input(s): CHOL, HDL, LDLCALC, TRIG, CHOLHDL, LDLDIRECT in the last 72 hours. Thyroid Function Tests: No results for input(s): TSH, T4TOTAL, FREET4, T3FREE, THYROIDAB in the last 72 hours. Anemia Panel: No results for input(s): VITAMINB12, FOLATE, FERRITIN, TIBC, IRON, RETICCTPCT in the last 72 hours. Urine analysis:    Component Value Date/Time   COLORURINE Yellow 02/10/2013 1000   APPEARANCEUR Clear 02/10/2013 1000   LABSPEC 1.009 02/10/2013 1000   PHURINE 6.0 02/10/2013 1000   GLUCOSEU Negative 02/10/2013 1000   HGBUR 1+ 02/10/2013 1000   BILIRUBINUR Negative  02/10/2013 1000   KETONESUR Negative 02/10/2013 1000   PROTEINUR Negative 02/10/2013 1000   NITRITE Negative 02/10/2013 1000   LEUKOCYTESUR 1+ 02/10/2013 1000    Radiological Exams on Admission: No results found.    Assessment/Plan  Sepsis secondary to right knee cellulitis and possible septic arthritis Patient was tachycardic with leukocytosis and right knee cellulitis Patient presenting with right knee cellulitis following IV injection of her right knee.  She has limited mobility of her knee on exam so there is high suspicion for septic arthritis of the joint.  Will need to consider Ortho consult for knee aspiration and culture of synovial fluids depending on clinical course. Continue vancomycin.  Will also add cefepime since IV drug users are more susceptible to Pseudomonas. Knee X-ray revealed possible gas forming bacteria- will add Flagyl as well. Pt not febrile and did not have crepitus on exam to suspect necrotizing cellulitis at this time- however low threshold for urgent surgical consult if pt has any new clinical changes.  Chest pain Patient endorsed 2 days of midsternal chest pain.  Will obtain EKG, chest x-ray, troponin. Low threshold to obtain echocardiogram if any of these imaging and lab work are abnormal since she is at high risk for endocarditis with IV drug use.  However she remains afebrile at this time.  Hypokalemia Repleted  Polysubstance abuse Patient endorses use of IV heroin, attempts to convert Suboxone film to IV form for injection and use of meth amphetamine TOC team consult   DVT prophylaxis:.Lovenox Code Status: Full Family Communication: Plan discussed with patient at bedside  disposition Plan: Home with at least 2 midnight stays  Consults called:  Admission status: inpatient  Status is: Inpatient  Remains inpatient appropriate because:Inpatient level of care appropriate due to severity of illness   Dispo: The patient is from: Home               Anticipated d/c is to: Home              Anticipated d/c date is: 3 days              Patient currently is not medically stable to d/c.         Anselm Jungling DO [facility]   If 7PM-7AM, please contact night-coverage [email or web address]   06/20/2020, 11:26 PM

## 2020-06-20 NOTE — ED Provider Notes (Signed)
[facility] Emergency Department Provider Note   ____________________________________________   I have reviewed the triage vital signs and the nursing notes.   HISTORY  Chief Complaint Abscess   History limited by: Not Limited   HPI Lori Pollard is a 29 y.o. female who presents to the emergency department today because of concerns for pain swelling and redness to the right leg.  The patient states that she shot up about 4 days ago.  She does have a history of IV drug use.  She states that since that time she has had increased swelling and redness to the right leg close to the right knee.  The patient has had some spontaneous drainage.  She denies any fevers although she has felt foggy.  Records reviewed. Per medical record review patient has a history of substance abuse.   Past Medical History:  Diagnosis Date  . Cervicalgia   . History of chickenpox   . Hives     Patient Active Problem List   Diagnosis Date Noted  . Status post cesarean delivery 11/03/2019  . Vaginal bleeding in pregnancy 11/01/2019  . [redacted] weeks gestation of pregnancy 11/01/2019  . 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 History of cesarean delivery 07/14/2019  . Suboxone maintenance treatment complicating pregnancy, antepartum (HCC) 07/14/2019  . Late prenatal care affecting pregnancy in second trimester 07/14/2019  . Anemia during pregnancy in second trimester 07/14/2019  . Bipolar disorder (HCC) 09/18/2015  . PTSD (post-traumatic stress disorder) 09/18/2015  . RAD (reactive airway disease) with wheezing 09/18/2015  . History of narcotic addiction (HCC) 09/18/2015  . Constipation due to pain medication therapy 09/18/2015  . Cervical pain 02/28/2009    Past Surgical History:  Procedure Laterality Date  . CESAREAN SECTION  2013  . CESAREAN SECTION  2015  . CESAREAN SECTION N/A 11/01/2019    Procedure: CESAREAN SECTION;  Surgeon: Natale Milch, MD;  Location: [facility] ORS;  Service: Obstetrics;  Laterality: N/A;    Prior to Admission medications   Medication Sig Start Date End Date Taking? Authorizing Provider  albuterol (PROVENTIL) (2.5 MG/3ML) 0.083% nebulizer solution Inhale into the lungs. 09/11/14   [provider]  buprenorphine (SUBUTEX) 8 MG SUBL SL tablet PLACE 1 TABLET UNDER THE TONGUE TWICE DAILY AND ONE HALF AT MIDDAY. LAST REFILL UNTIL SEEN 07/30/19   [provider]  Fe Cbn-Fe Gluc-FA-B12-C-DSS (FERRALET 90) 90-1 MG TABS Take 1 tablet by mouth daily. Patient not taking: Reported on 11/01/2019 09/07/19   Conard Novak, MD  HYDROcodone-acetaminophen [facility]) 5-325 MG tablet Take 1 tablet by mouth every 6 (six) hours as needed for moderate pain. 11/03/19   Conard Novak, MD  ibuprofen (ADVIL) 800 MG tablet Take 1 tablet (800 mg total) by mouth every 8 (eight) hours. 11/03/19   Conard Novak, MD    Allergies Patient has no known allergies.  Family History  Problem Relation Age of Onset  . Alcohol abuse Mother   . GER disease Mother   . Depression Mother   . Alcohol abuse Father   . Emphysema Father   . Diabetes Maternal Grandmother   . Heart disease Maternal Grandmother   . Hyperlipidemia Maternal Grandmother   . Hypertension Maternal Grandmother     Social History Social History   Tobacco Use  . Smoking status: Current Every Day Smoker    Packs/day: 0.50    Years: 11.00    Pack years: 5.50  Types: Cigarettes    Start date: 09/17/2004  . Smokeless tobacco: Never Used  Vaping Use  . Vaping Use: Never used  Substance Use Topics  . Alcohol use: No    Alcohol/week: 0.0 standard drinks  . Drug use: No    Review of Systems Constitutional: No fever/chills Eyes: No visual changes. ENT: No sore throat. Cardiovascular: Denies chest pain. Respiratory: Denies shortness of breath. Gastrointestinal: No abdominal pain.   No nausea, no vomiting.  No diarrhea.   Genitourinary: Negative for dysuria. Musculoskeletal: Positive for right leg pain. Skin: Positive for redness to right leg. Neurological: Negative for headaches, focal weakness or numbness.  ____________________________________________   PHYSICAL EXAM:  VITAL SIGNS: ED Triage Vitals [06/20/20 1605]  Enc Vitals Group     BP 103/61     Pulse Rate (!) 115     Resp 18     Temp 98.1 F (36.7 C)     Temp Source Oral     SpO2 100 %     Weight 105 lb 13.1 oz (48 kg)     Height 4\' 11"  (1.499 m)     Head Circumference      Peak Flow      Pain Score 10   Constitutional: Alert and oriented.  Eyes: Conjunctivae are normal.  ENT      Head: Normocephalic and atraumatic.      Nose: No congestion/rhinnorhea.      Mouth/Throat: Mucous membranes are moist.      Neck: No stridor. Hematological/Lymphatic/Immunilogical: No cervical lymphadenopathy. Cardiovascular: Normal rate, regular rhythm.  No murmurs, rubs, or gallops.  Respiratory: Normal respiratory effort without tachypnea nor retractions. Breath sounds are clear and equal bilaterally. No wheezes/rales/rhonchi. Gastrointestinal: Soft and non tender. No rebound. No guarding.  Genitourinary: Deferred Musculoskeletal: Swelling around right distal thigh, knee and lower leg with associated erythema. Area of spontaneous drainage to lateral proximal lower leg. No fluctuance.  Neurologic:  Normal speech and language. No gross focal neurologic deficits are appreciated.  Skin:  Multiple small skin lesions throughout extremities. Large area of erythema to right leg.  Psychiatric: Mood and affect are normal. Speech and behavior are normal. Patient exhibits appropriate insight and judgment.  ____________________________________________    LABS (pertinent positives/negatives)  CMP wnl except k 2.9, glu 119, ca 8.5, alb 3.1 CBC wbc 16.8, hgb 11.6, plt 246 Lactic  2.6 ____________________________________________   EKG  None  ____________________________________________    RADIOLOGY  None  ____________________________________________   PROCEDURES  Procedures  ____________________________________________   INITIAL IMPRESSION / ASSESSMENT AND PLAN / ED COURSE  Pertinent labs & imaging results that were available during my care of the patient were reviewed by me and considered in my medical decision making (see chart for details).   Patient presented to the emergency department today because of concerns for swelling and redness to her right leg.  Patient does have IV drug abuse history.  On exam patient does have a large area of erythema and swelling to her right leg.  I did use ultrasound to try to evaluate the knee joint.  She is able to bend her knee.  I did not see any obvious effusion around the knee joint.  At this point I do think it is likely cellulitis.  I have low suspicion for septic joint.  Will place patient on IV antibiotics and IV fluids.  Will plan on admission.  Discussed plan with patient.  ____________________________________________   FINAL CLINICAL IMPRESSION(S) / ED DIAGNOSES  Final diagnoses:  Cellulitis, unspecified cellulitis site     Note: This dictation was prepared with Dragon dictation. Any transcriptional errors that result from this process are unintentional     Phineas Semen, MD 06/20/20 2300

## 2020-06-21 ENCOUNTER — Inpatient Hospital Stay: Payer: Medicaid Other

## 2020-06-21 DIAGNOSIS — E440 Moderate protein-calorie malnutrition: Secondary | ICD-10-CM | POA: Insufficient documentation

## 2020-06-21 LAB — CBC
HCT: 29.4 % — ABNORMAL LOW (ref 36.0–46.0)
Hemoglobin: 10.1 g/dL — ABNORMAL LOW (ref 12.0–15.0)
MCH: 31.8 pg (ref 26.0–34.0)
MCHC: 34.4 g/dL (ref 30.0–36.0)
MCV: 92.5 fL (ref 80.0–100.0)
Platelets: 217 10*3/uL (ref 150–400)
RBC: 3.18 MIL/uL — ABNORMAL LOW (ref 3.87–5.11)
RDW: 13.5 % (ref 11.5–15.5)
WBC: 16.3 10*3/uL — ABNORMAL HIGH (ref 4.0–10.5)
nRBC: 0 % (ref 0.0–0.2)

## 2020-06-21 LAB — BASIC METABOLIC PANEL
Anion gap: 7 (ref 5–15)
BUN: 10 mg/dL (ref 6–20)
CO2: 25 mmol/L (ref 22–32)
Calcium: 7.6 mg/dL — ABNORMAL LOW (ref 8.9–10.3)
Chloride: 108 mmol/L (ref 98–111)
Creatinine, Ser: 0.72 mg/dL (ref 0.44–1.00)
GFR calc Af Amer: >60 mL/min (ref >60)
GFR calc non Af Amer: >60 mL/min (ref >60)
Glucose, Bld: 109 mg/dL — ABNORMAL HIGH (ref 70–99)
Potassium: 3.1 mmol/L — ABNORMAL LOW (ref 3.5–5.1)
Sodium: 140 mmol/L (ref 135–145)

## 2020-06-21 LAB — PREGNANCY, URINE: Preg Test, Ur: NEGATIVE

## 2020-06-21 LAB — TROPONIN I (HIGH SENSITIVITY): Troponin I (High Sensitivity): 5 ng/L (ref <18)

## 2020-06-21 LAB — PROTIME-INR
INR: 1.2 (ref 0.8–1.2)
Prothrombin Time: 14.3 seconds (ref 11.4–15.2)

## 2020-06-21 LAB — LACTIC ACID, PLASMA: Lactic Acid, Venous: 2.2 mmol/L (ref 0.5–1.9)

## 2020-06-21 MED ORDER — HYDROCODONE-ACETAMINOPHEN 7.5-325 MG PO TABS
1.0000 | ORAL_TABLET | ORAL | Status: DC | PRN
  Administered 2020-06-21 – 2020-06-22 (×3): 1 via ORAL
  Filled 2020-06-21 (×3): qty 1

## 2020-06-21 MED ORDER — ADULT MULTIVITAMIN W/MINERALS CH
1.0000 | ORAL_TABLET | Freq: Every day | ORAL | Status: DC
  Administered 2020-06-22 – 2020-06-29 (×7): 1 via ORAL
  Filled 2020-06-21 (×7): qty 1

## 2020-06-21 MED ORDER — MORPHINE SULFATE (PF) 2 MG/ML IV SOLN
0.5000 mg | Freq: Once | INTRAVENOUS | Status: AC
  Administered 2020-06-21: 0.5 mg via INTRAVENOUS

## 2020-06-21 MED ORDER — DIPHENHYDRAMINE HCL 50 MG/ML IJ SOLN
50.0000 mg | Freq: Four times a day (QID) | INTRAMUSCULAR | Status: DC | PRN
  Administered 2020-06-21: 50 mg via INTRAVENOUS
  Filled 2020-06-21: qty 1

## 2020-06-21 MED ORDER — ENOXAPARIN SODIUM 30 MG/0.3ML ~~LOC~~ SOLN
30.0000 mg | SUBCUTANEOUS | Status: DC
  Administered 2020-06-21 – 2020-06-24 (×4): 30 mg via SUBCUTANEOUS
  Filled 2020-06-21 (×4): qty 0.3

## 2020-06-21 MED ORDER — VANCOMYCIN HCL 500 MG/100ML IV SOLN
500.0000 mg | Freq: Two times a day (BID) | INTRAVENOUS | Status: DC
  Administered 2020-06-21 – 2020-06-24 (×6): 500 mg via INTRAVENOUS
  Filled 2020-06-21 (×8): qty 100

## 2020-06-21 MED ORDER — CHLORHEXIDINE GLUCONATE CLOTH 2 % EX PADS
6.0000 | MEDICATED_PAD | Freq: Every day | CUTANEOUS | Status: DC
  Administered 2020-06-21 – 2020-06-28 (×8): 6 via TOPICAL
  Filled 2020-06-21: qty 6

## 2020-06-21 MED ORDER — POTASSIUM CHLORIDE 10 MEQ/100ML IV SOLN
10.0000 meq | INTRAVENOUS | Status: AC
  Administered 2020-06-21 (×3): 10 meq via INTRAVENOUS
  Filled 2020-06-21: qty 100

## 2020-06-21 MED ORDER — ALUM & MAG HYDROXIDE-SIMETH 200-200-20 MG/5ML PO SUSP
30.0000 mL | ORAL | Status: DC | PRN
  Administered 2020-06-21: 30 mL via ORAL
  Filled 2020-06-21: qty 30

## 2020-06-21 MED ORDER — HYDROMORPHONE HCL 1 MG/ML IJ SOLN
0.5000 mg | Freq: Once | INTRAMUSCULAR | Status: AC
  Administered 2020-06-21: 0.5 mg via INTRAVENOUS

## 2020-06-21 MED ORDER — SODIUM CHLORIDE 0.9% FLUSH: 10.0000 mL | INTRAVENOUS | Status: DC | PRN

## 2020-06-21 MED ORDER — MORPHINE SULFATE (PF) 2 MG/ML IV SOLN: 2.0000 mg | INTRAVENOUS | Status: DC | PRN

## 2020-06-21 MED ORDER — POTASSIUM CHLORIDE CRYS ER 20 MEQ PO TBCR: 40.0000 meq | EXTENDED_RELEASE_TABLET | Freq: Once | ORAL | Status: DC

## 2020-06-21 MED ORDER — MORPHINE SULFATE (PF) 2 MG/ML IV SOLN
1.0000 mg | INTRAVENOUS | Status: DC | PRN
  Administered 2020-06-21 – 2020-06-29 (×38): 1 mg via INTRAVENOUS
  Filled 2020-06-21 (×41): qty 1

## 2020-06-21 MED ORDER — SODIUM CHLORIDE 0.9 % IV BOLUS
500.0000 mL | Freq: Once | INTRAVENOUS | Status: AC
  Administered 2020-06-21: 500 mL via INTRAVENOUS

## 2020-06-21 MED ORDER — HYDROMORPHONE HCL 1 MG/ML IJ SOLN
INTRAMUSCULAR | Status: AC
  Filled 2020-06-21: qty 1

## 2020-06-21 MED ORDER — ENSURE ENLIVE PO LIQD
237.0000 mL | Freq: Two times a day (BID) | ORAL | Status: DC
  Administered 2020-06-22 – 2020-06-28 (×12): 237 mL via ORAL

## 2020-06-21 MED ORDER — METRONIDAZOLE IN NACL 5-0.79 MG/ML-% IV SOLN
500.0000 mg | Freq: Three times a day (TID) | INTRAVENOUS | Status: DC
  Administered 2020-06-21 – 2020-06-26 (×17): 500 mg via INTRAVENOUS
  Filled 2020-06-21 (×20): qty 100

## 2020-06-21 NOTE — Progress Notes (Signed)
Initial Nutrition Assessment  DOCUMENTATION CODES:   Non-severe (moderate) malnutrition in context of social or environmental circumstances  INTERVENTION:   Ensure Enlive po BID, each supplement provides 350 kcal and 20 grams of protein  MVI daily   Pt at high refeed risk; recommend monitor K, Mg and P labs daily as oral intake improves.   NUTRITION DIAGNOSIS:   Moderate Malnutrition related to social / environmental circumstances as evidenced by moderate fat depletion, moderate muscle depletion, 14 percent weight loss in 8 months.  GOAL:   Patient will meet greater than or equal to 90% of their needs  MONITOR:   PO intake, Supplement acceptance, Labs, Weight trends, Skin, I & O's  REASON FOR ASSESSMENT:   Other (Comment) (Low BMI)    ASSESSMENT:   29 y.o. 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   Met with pt in room today. Pt reports poor appetite and oral intake pta. Pt reports that she has been losing a lot of weight lately. RD suspects pt with poor appetite and oral intake at baseline r/t substance abuse. Per chart, pt is down 17lbs(14%) over the past 8 months; this is significant. Pt currently NPO. RD will add supplements and MVI to help pt meet her estimated needs. Suspect pt is a refeed risk.   Medications reviewed and include: lovenox, NaCl '@100ml' /hr, cefepime, metronidazole, KCl, vancomycin   Labs reviewed: K 3.1(L) Wbc- 16.3(H), Hgb 10.1(L), Hct 29.4(L)  NUTRITION - FOCUSED PHYSICAL EXAM:    Most Recent Value  Orbital Region No depletion  Upper Arm Region Moderate depletion  Thoracic and Lumbar Region Moderate depletion  Buccal Region Mild depletion  Temple Region Mild depletion  Clavicle Bone Region Moderate depletion  Clavicle and Acromion Bone Region Moderate depletion  Scapular Bone Region Moderate  depletion  Dorsal Hand Moderate depletion  Patellar Region Moderate depletion  Anterior Thigh Region Moderate depletion  Posterior Calf Region Moderate depletion  Edema (RD Assessment) Mild  Hair Reviewed  Eyes Reviewed  Mouth Reviewed  Skin Reviewed  Nails Reviewed     Diet Order:   Diet Order            Diet NPO time specified  Diet effective now                EDUCATION NEEDS:   Education needs have been addressed  Skin:  Skin Assessment: Reviewed RN Assessment  Last BM:  8/11  Height:   Ht Readings from Last 1 Encounters:  06/21/20 '4\' 11"'  (1.499 m)    Weight:   Wt Readings from Last 1 Encounters:  06/21/20 40.3 kg    Ideal Body Weight:  44.5 kg  BMI:  Body mass index is 17.96 kg/m.  Estimated Nutritional Needs:   Kcal:  1400-1600kcal/day  Protein:  70-80g/day  Fluid:  1.2-1.4L/day  Koleen Distance MS, RD, LDN Please refer to [facility] for RD and/or RD on-call/weekend/after hours pager

## 2020-06-21 NOTE — Progress Notes (Signed)
Pharmacy Antibiotic Note  Lori Pollard is a 29 y.o. female admitted on 06/20/2020 with cellulitis.  Pharmacy has been consulted for vanc/cefepime/flagyl dosing.  Plan: Patient received vanc 1g IV load followed by vanc 500 mg IV q12h.  Will continue cefepime 2g IV q8h and will continue to monitor clinical course and adjust doses per changes in renal function. Will need to await am pregnancy test prior to initiating vanc maintenance as patient is of child-bearing age.  Ke 0.0586 T1/2 ~ 12 hrs Goal trough 15 - 20 mcg/mL.  Height: 4\' 11"  (149.9 cm) Weight: 40.3 kg (88 lb 14.4 oz) IBW/kg (Calculated) : 43.2  Temp (24hrs), Avg:98 F (36.7 C), Min:97.6 F (36.4 C), Max:98.4 F (36.9 C)  Recent Labs  Lab 06/20/20 1607 06/20/20 2138 06/21/20 0142  WBC 16.8*  --  16.3*  CREATININE 0.82  --  0.72  LATICACIDVEN 2.6* 2.2*  --     Estimated Creatinine Clearance: 65.4 mL/min (by C-G formula based on SCr of 0.72 mg/dL).    No Known Allergies   Thank you for allowing pharmacy to be a part of this patient's care.  08/21/20, PharmD, BCPS Clinical Pharmacist 06/21/2020 5:58 AM

## 2020-06-21 NOTE — Progress Notes (Signed)
PROGRESS NOTE    Lori Pollard  NLZ:[id number] DOB: Jun 28, 1991 DOA: 06/20/2020 PCP: Patient, No Pcp Per   Brief Narrative: 29 year old with past medical history significant for narcotic abuse previously on Suboxone, polysubstance abuse, bipolar, PTSD who presented with worsening right knee pain and erythema.  Patient was trying to inject Suboxone film that she got from a friend through IV into her right lateral knee about a week ago.  She developed small induration area in her right knee that is spread to her thigh and leg. Presented with tachycardia, leukocytosis, lactic acid 2.6.  Potassium of 2.9.    Assessment & Plan:   Active Problems:   Cellulitis   Sepsis (HCC)   Chest pain   Polysubstance abuse (HCC)  1-Sepsis secondary to primary cellulitis possible septic arthritis: Patient presents with tachycardia, leukocytosis source for infection of right knee. Discussed with Dr. Allena Katz plan to proceed with MRI of the knee and he will see patient in consultation. Continue with vancomycin, cefepime and Flagyl.   2-Chest pain: EKG sinus rhythm troponin negative.  3-Hypokalemia: Replete IV  4-Polysubstance abuse: Endorses use of IV heroin.  TOC team has completed consulted  Estimated body mass index is 17.96 kg/m as calculated from the following:   Height as of this encounter: 4\' 11"  (1.499 m).   Weight as of this encounter: 40.3 kg.   DVT prophylaxis: Lovenox Code Status: Full code Family Communication: Care discussed with patient Disposition Plan:  Status is: Inpatient  Remains inpatient appropriate because:Hemodynamically unstable   Dispo: The patient is from: Home              Anticipated d/c is to: To be determined              Anticipated d/c date is: 3 days              Patient currently is not medically stable to d/c.        Consultants:   Dr   Procedures:   none  Antimicrobials:    Subjective: Patient is complaining of severe  right knee pain, report redness and swelling is not worse today. Objective: Vitals:   06/20/20 2145 06/21/20 0023 06/21/20 0106 06/21/20 0515  BP: 105/71 103/66 101/72 90/69  Pulse: (!) 109 (!) 111 (!) 101 89  Resp: 20 18 20 18   Temp:   97.6 F (36.4 C) 98.4 F (36.9 C)  TempSrc:   Oral Oral  SpO2: 99% 100% 100% 99%  Weight:   40.3 kg   Height:   4\' 11"  (1.499 m)     Intake/Output Summary (Last 24 hours) at 06/21/2020 0807 Last data filed at 06/21/2020 0445 Gross per 24 hour  Intake 1543.74 ml  Output 400 ml  Net 1143.74 ml   Filed Weights   06/20/20 1605 06/20/20 2143 06/21/20 0106  Weight: 48 kg 41.7 kg 40.3 kg    Examination:  General exam: Appears calm and comfortable  Respiratory system: Clear to auscultation. Respiratory effort normal. Cardiovascular system: S1 & S2 heard, RRR. No JVD, murmurs, rubs, gallops or clicks. No pedal edema. Gastrointestinal system: Abdomen is nondistended, soft and nontender. No organomegaly or masses felt. Normal bowel sounds heard. Central nervous system: Alert and oriented. No focal neurological deficits. Extremities: Right lower extremity and knee with redness and edema, small open wound lateral right knee with drainage, foul-smelling.    Data Reviewed: I have personally reviewed following labs and imaging studies  CBC: Recent Labs  Lab 06/20/20 1607  06/21/20 0142  WBC 16.8* 16.3*  NEUTROABS 14.3*  --   HGB 11.6* 10.1*  HCT 35.3* 29.4*  MCV 95.9 92.5  PLT 246 217   Basic Metabolic Panel: Recent Labs  Lab 06/20/20 1607 06/21/20 0142  NA 139 140  K 2.9* 3.1*  CL 104 108  CO2 22 25  GLUCOSE 119* 109*  BUN 10 10  CREATININE 0.82 0.72  CALCIUM 8.5* 7.6*   GFR: Estimated Creatinine Clearance: 65.4 mL/min (by C-G formula based on SCr of 0.72 mg/dL). Liver Function Tests: Recent Labs  Lab 06/20/20 1607  AST 26  ALT 15  ALKPHOS 81  BILITOT 0.6  PROT 6.9  ALBUMIN 3.1*   No results for input(s): LIPASE, AMYLASE in  the last 168 hours. No results for input(s): AMMONIA in the last 168 hours. Coagulation Profile: Recent Labs  Lab 06/21/20 0142  INR 1.2   Cardiac Enzymes: No results for input(s): CKTOTAL, CKMB, CKMBINDEX, TROPONINI in the last 168 hours. BNP (last 3 results) No results for input(s): PROBNP in the last 8760 hours. HbA1C: No results for input(s): HGBA1C in the last 72 hours. CBG: No results for input(s): GLUCAP in the last 168 hours. Lipid Profile: No results for input(s): CHOL, HDL, LDLCALC, TRIG, CHOLHDL, LDLDIRECT in the last 72 hours. Thyroid Function Tests: No results for input(s): TSH, T4TOTAL, FREET4, T3FREE, THYROIDAB in the last 72 hours. Anemia Panel: No results for input(s): VITAMINB12, FOLATE, FERRITIN, TIBC, IRON, RETICCTPCT in the last 72 hours. Sepsis Labs: Recent Labs  Lab 06/20/20 1607 06/20/20 2138  LATICACIDVEN 2.6* 2.2*    Recent Results (from the past 240 hour(s))  Culture, blood (Routine x 2)     Status: None (Preliminary result)   Collection Time: 06/20/20  4:07 PM   Specimen: BLOOD  Result Value Ref Range Status   Specimen Description BLOOD BLOOD RIGHT HAND  Final   Special Requests   Final    BOTTLES DRAWN AEROBIC AND ANAEROBIC Blood Culture results may not be optimal due to an inadequate volume of blood received in culture bottles   Culture   Final    NO GROWTH < 24 HOURS Performed at [facility], [address] [place], [place] [address]    Report Status PENDING  Incomplete  Culture, blood (Routine x 2)     Status: None (Preliminary result)   Collection Time: 06/20/20  9:39 PM   Specimen: BLOOD  Result Value Ref Range Status   Specimen Description BLOOD BLOOD LEFT FOREARM  Final   Special Requests   Final    BOTTLES DRAWN AEROBIC AND ANAEROBIC Blood Culture adequate volume   Culture   Final    NO GROWTH < 12 HOURS Performed at [facility], [address], [place], [place] [address]    Report Status PENDING   Incomplete  SARS Coronavirus 2 by RT PCR (hospital order, performed in [facility] hospital lab) Nasopharyngeal Nasopharyngeal Swab     Status: None   Collection Time: 06/20/20 10:18 PM   Specimen: Nasopharyngeal Swab  Result Value Ref Range Status   SARS Coronavirus 2 NEGATIVE NEGATIVE Final    Comment: (NOTE) SARS-CoV-2 target nucleic acids are NOT DETECTED.  The SARS-CoV-2 RNA is generally detectable in upper and lower respiratory specimens during the acute phase of infection. The lowest concentration of SARS-CoV-2 viral copies this assay can detect is 250 copies / mL. A negative result does not preclude SARS-CoV-2 infection and should not be used as the sole basis for  treatment or other patient management decisions.  A negative result may occur with improper specimen collection / handling, submission of specimen other than nasopharyngeal swab, presence of viral mutation(s) within the areas targeted by this assay, and inadequate number of viral copies (<250 copies / mL). A negative result must be combined with clinical observations, patient history, and epidemiological information.  Fact Sheet for Patients:   BoilerBrush.com.cy  Fact Sheet for Healthcare Providers: https://pope.com/  This test is not yet approved or  cleared by the [place] FDA and has been authorized for detection and/or diagnosis of SARS-CoV-2 by FDA under an Emergency Use Authorization (EUA).  This EUA will remain in effect (meaning this test can be used) for the duration of the COVID-19 declaration under Section 564(b)(1) of the Act, 21 U.S.C. section 360bbb-3(b)(1), unless the authorization is terminated or revoked sooner.  Performed at [facility], [address] [place], [place] [address]          Radiology Studies: DG Chest 1 View  Result Date: 06/21/2020 CLINICAL DATA:  29 year old female with IV drug use, infection around the  knee. EXAM: CHEST  1 VIEW COMPARISON:  Chest radiograph 05/11/2020. FINDINGS: Portable AP upright view at 0042 hours. Lung volumes and mediastinal contours remain normal. There is new diffuse pulmonary vascular congestion. No pleural fluid. No pneumothorax. No confluent pulmonary opacity. Stable mild thoracic scoliosis. No acute osseous abnormality identified. Negative visible bowel gas pattern. IMPRESSION: Pulmonary vascular congestion without overt edema or pleural effusion. Electronically Signed   By: Odessa Fleming M.D.   On: 06/21/2020 00:57   DG Knee 1-2 Views Right  Result Date: 06/21/2020 CLINICAL DATA:  29 year old female with IV drug use, infection around the knee. EXAM: RIGHT KNEE - 1-2 VIEW COMPARISON:  None. FINDINGS: Normal joint spaces and alignment. Bone mineralization is within normal limits. No evidence of joint effusion. Anterior, medial and lateral soft tissue swelling and stranding, with abnormal soft tissue gas tracking about the knee. No radiopaque foreign body identified. IMPRESSION: 1. Soft tissue swelling and multifocal soft tissue gas suspicious for a necrotizing or gas-forming infection. 2. No osseous abnormality or joint effusion. Electronically Signed   By: Odessa Fleming M.D.   On: 06/21/2020 00:59        Scheduled Meds: . Chlorhexidine Gluconate Cloth  6 each Topical Daily  . enoxaparin (LOVENOX) injection  40 mg Subcutaneous Q24H  . potassium chloride  40 mEq Oral Once   Continuous Infusions: . sodium chloride 100 mL/hr at 06/21/20 0445  . ceFEPime (MAXIPIME) IV 2 g (06/21/20 0513)  . metronidazole 500 mg (06/21/20 0307)  . vancomycin       LOS: 1 day    Time spent: 35 minutes.     Alba Cory, MD [facility]   If 7PM-7AM, please contact night-coverage [email or web address]  06/21/2020, 8:07 AM

## 2020-06-21 NOTE — Progress Notes (Signed)
PHARMACIST - PHYSICIAN COMMUNICATION  CONCERNING:  Enoxaparin (Lovenox) for DVT Prophylaxis    RECOMMENDATION: Patient was prescribed enoxaprin 40mg  q24 hours for VTE prophylaxis.   Filed Weights   06/20/20 1605 06/20/20 2143 06/21/20 0106  Weight: 48 kg (105 lb 13.1 oz) 41.7 kg (92 lb) 40.3 kg (88 lb 14.4 oz)    Body mass index is 17.96 kg/m.  Estimated Creatinine Clearance: 65.4 mL/min (by C-G formula based on SCr of 0.72 mg/dL).    Patient is candidate for enoxaparin 30mg  every 24 hours based on CrCl <69ml/min or Weight <45kg  DESCRIPTION: Pharmacy has adjusted enoxaparin dose per [facility] policy.   Patient is now receiving enoxaparin 30mg  every 24 hours.  , PharmD Clinical Pharmacist  06/21/2020 2:20 PM

## 2020-06-21 NOTE — Consult Note (Addendum)
ORTHOPAEDIC CONSULTATION  REQUESTING PHYSICIAN: Alba Cory, MD  Chief Complaint:   R leg draining wound  History of Present Illness: Lori Pollard is a 29 y.o. female With a history of narcotic abuse previously on Suboxone, polysubstance abuse, bipolar disorder, and PTSD who was admitted overnight for presumed right lower extremity cellulitis and a draining wound about the anterolateral aspect of the right knee.  The patient notes that she was trying to put Suboxone film through an IV into the lateral aspect about the right knee about 10 days ago.  She then noticed a small induration that eventually began to drain.  She had increased erythema about the knee over the past 3 days, and it is now affecting her ability to ambulate and range the knee.  She does states she is done IV drugs over the past few weeks .  She was started on IV antibiotics and then admitted to the hospital.  In the emergency department she was tachycardic and had an elevated white blood cell count.  Today, her tachycardia has resolved.  I was consulted regarding the draining right lower extremity wound for possible abscess drainage.  Past Medical History:  Diagnosis Date   Cervicalgia    History of chickenpox    Hives    Past Surgical History:  Procedure Laterality Date   CESAREAN SECTION  2013   CESAREAN SECTION  2015   CESAREAN SECTION N/A 11/01/2019   Procedure: CESAREAN SECTION;  Surgeon: Natale Milch, MD;  Location: [facility] ORS;  Service: Obstetrics;  Laterality: N/A;   Social History   Socioeconomic History   Marital status: Single    Spouse name: Not on file   Number of children: Not on file   Years of education: Not on file   Highest education level: Not on file  Occupational History   Not on file  Tobacco Use   Smoking status: Current Every Day Smoker    Packs/day: 0.50    Years: 11.00    Pack years:  5.50    Types: Cigarettes    Start date: 09/17/2004   Smokeless tobacco: Never Used  Vaping Use   Vaping Use: Never used  Substance and Sexual Activity   Alcohol use: No    Alcohol/week: 0.0 standard drinks   Drug use: No   Sexual activity: Yes    Partners: Male    Birth control/protection: Surgical  Other Topics Concern   Not on file  Social History Narrative   Not on file   Social Determinants of Health   Financial Resource Strain:    Difficulty of Paying Living Expenses:   Food Insecurity:    Worried About Programme researcher, broadcasting/film/video in the Last Year:    Barista in the Last Year:   Transportation Needs:    Freight forwarder (Medical):    Lack of Transportation (Non-Medical):   Physical Activity:    Days of Exercise per Week:    Minutes of Exercise per Session:   Stress:    Feeling of Stress :   Social Connections:    Frequency of Communication with Friends and Family:    Frequency of Social Gatherings with Friends and Family:    Attends Religious Services:    Active Member of Clubs or Organizations:    Attends Engineer, structural:    Marital Status:    Family History  Problem Relation Age of Onset   Alcohol abuse Mother    GER disease Mother  Depression Mother    Alcohol abuse Father    Emphysema Father    Diabetes Maternal Grandmother    Heart disease Maternal Grandmother    Hyperlipidemia Maternal Grandmother    Hypertension Maternal Grandmother    No Known Allergies Prior to Admission medications   Medication Sig Start Date End Date Taking? Authorizing Provider  albuterol (PROVENTIL) (2.5 MG/3ML) 0.083% nebulizer solution Inhale into the lungs. 09/11/14   [provider]  buprenorphine (SUBUTEX) 8 MG SUBL SL tablet PLACE 1 TABLET UNDER THE TONGUE TWICE DAILY AND ONE HALF AT MIDDAY. LAST REFILL UNTIL SEEN 07/30/19   [provider]  Fe Cbn-Fe Gluc-FA-B12-C-DSS (FERRALET 90) 90-1 MG TABS Take 1  tablet by mouth daily. Patient not taking: Reported on 11/01/2019 09/07/19   Conard Novak, MD  HYDROcodone-acetaminophen [facility]) 5-325 MG tablet Take 1 tablet by mouth every 6 (six) hours as needed for moderate pain. 11/03/19   Conard Novak, MD  ibuprofen (ADVIL) 800 MG tablet Take 1 tablet (800 mg total) by mouth every 8 (eight) hours. 11/03/19   Conard Novak, MD   Recent Labs    06/20/20 1607 06/21/20 0142  WBC 16.8* 16.3*  HGB 11.6* 10.1*  HCT 35.3* 29.4*  PLT 246 217  K 2.9* 3.1*  CL 104 108  CO2 22 25  BUN 10 10  CREATININE 0.82 0.72  GLUCOSE 119* 109*  CALCIUM 8.5* 7.6*  INR  --  1.2   DG Chest 1 View  Result Date: 06/21/2020 CLINICAL DATA:  29 year old female with IV drug use, infection around the knee. EXAM: CHEST  1 VIEW COMPARISON:  Chest radiograph 05/11/2020. FINDINGS: Portable AP upright view at 0042 hours. Lung volumes and mediastinal contours remain normal. There is new diffuse pulmonary vascular congestion. No pleural fluid. No pneumothorax. No confluent pulmonary opacity. Stable mild thoracic scoliosis. No acute osseous abnormality identified. Negative visible bowel gas pattern. IMPRESSION: Pulmonary vascular congestion without overt edema or pleural effusion. Electronically Signed   By: Odessa Fleming M.D.   On: 06/21/2020 00:57   DG Knee 1-2 Views Right  Result Date: 06/21/2020 CLINICAL DATA:  29 year old female with IV drug use, infection around the knee. EXAM: RIGHT KNEE - 1-2 VIEW COMPARISON:  None. FINDINGS: Normal joint spaces and alignment. Bone mineralization is within normal limits. No evidence of joint effusion. Anterior, medial and lateral soft tissue swelling and stranding, with abnormal soft tissue gas tracking about the knee. No radiopaque foreign body identified. IMPRESSION: 1. Soft tissue swelling and multifocal soft tissue gas suspicious for a necrotizing or gas-forming infection. 2. No osseous abnormality or joint effusion. Electronically  Signed   By: Odessa Fleming M.D.   On: 06/21/2020 00:59   MR KNEE RIGHT WO CONTRAST  Result Date: 06/21/2020 CLINICAL DATA:  IV drug abuser with right knee pain, redness and swelling. EXAM: MRI OF THE RIGHT KNEE WITHOUT CONTRAST TECHNIQUE: Multiplanar, multisequence MR imaging of the knee was performed. No intravenous contrast was administered. COMPARISON:  Plain films right knee 06/21/2020. FINDINGS: Scattered skin ulcerations about the knee are seen. Extensive subcutaneous edema is present about the knee. Small foci signal dropout in the subcutaneous fatty tissues correlate with soft tissue gas seen on the prior exam. No focal fluid collection is identified. There is some fluid about the gastrocnemius muscle. No evidence of gas tracking along fascial planes is identified. MENISCI Medial meniscus:  Intact. Lateral meniscus:  Intact. LIGAMENTS Cruciates:  Intact. Collaterals:  Intact. CARTILAGE Patellofemoral:  Normal. Medial:  Normal. Lateral:  Normal. Joint:  Trace joint effusion. Popliteal Fossa: No Baker's cyst. There is edema in fat in the popliteal fossa. Extensor Mechanism:  Intact. Bones:  Normal marrow signal throughout. Other: None. IMPRESSION: Negative for osteomyelitis or septic joint. Findings most consistent with intense cellulitis about the knee. Edema in fat of the popliteal fossa is also worrisome for infection. There is some fluid along the superficial fascia of the gastrocnemius muscle suggestive of fasciitis. Negative for myositis or gas along fascial planes to suggest necrotizing fasciitis. Electronically Signed   By: Drusilla Kannerhomas  Dalessio M.D.   On: 06/21/2020 11:57     Positive ROS: All other systems have been reviewed and were otherwise negative with the exception of those mentioned in the HPI and as above.  Physical Exam: BP 102/74 (BP Location: Right Arm)    Pulse 93    Temp 98.7 F (37.1 C) (Oral)    Resp 18    Ht 4\' 11"  (1.499 m)    Wt 40.3 kg    SpO2 100%    BMI 17.96 kg/m  General:   Alert, no acute distress Psychiatric:  Patient is competent for consent with normal mood and affect   Cardiovascular:  No pedal edema, regular rate and rhythm Respiratory:  No wheezing, non-labored breathing GI:  Abdomen is soft and non-tender Skin:  No lesions in the area of chief complaint, no erythema Neurologic:  Sensation intact distally, CN grossly intact Lymphatic:  No axillary or cervical lymphadenopathy  Orthopedic Exam:  RLE: 5/5 DF/PF/EHL, mild pain with resisted ankle dorsiflexion plantarflexion. SILT s/s/t/sp/dp distr Foot wwp RoM knee from 0-80 with increased pain greater than 80. Draining, foul-smelling wound with necrotic skin edges measuring approximately 1 x 1 cm about the anterolateral aspect of the knee at the level of the joint line with significant surrounding erythema tracking posterior to the knee, down the lateral aspect of the leg, and proximally along the lateral and anterior aspects of the thigh.  Regions of erythema are significantly tender.   Imaging:  Radiographs in the emergency department showed some soft tissue swelling as well as some possible gas tracking about the knee.  MRI showed severe soft tissue swelling and fasciitis/myositis.  There is no knee joint effusion or involvement of the knee joint itself.  There also appears to be a small fluid collection measuring approximately 4.5 cm x 0.7 cm along the lateral aspect of the knee superficial to the knee joint itself.  Assessment/Plan: 29 year old female with history of IV drug use who attempted to inject about the lateral aspect of the knee that is now resulting in a draining wound with likely abscess about this region. 1.  I discussed the findings with the patient.  I recommended undergoing drainage of the abscess.  We agreed to perform this at the bedside.  Please see procedure note below for further details.  In short, there was gross purulence that was returned.  There did not appear to be  involvement of the knee joint itself.  2.  Patient has packing in the wound itself, and this will be removed tomorrow.  3.  Continue IV antibiotics per primary team.  Two wound cultures were sent, although these may not show any significant growth given IV antibiotic administration prior to culture sample.  4.  Recommend wound care consult for possible VAC placement once infection is cleared as the patient has a an approximately 1 x 1 cm skin defect.  5.  We will plan to follow  along and reevaluate tomorrow.    Procedure Note - Incision & Drainage with Debridement I reviewed with the patient the procedure of R knee abscess incision and drainage and excisional debridement and discussed the risks, benefits, and alternative treatments. We discussed the potential risks of continued infection, increased pain, and incomplete relief or temporary relief of symptoms. Verbal consent was obtained.  A time-out was conducted to verify correct patient identity, procedure to be performed, and correct side and site. ~10cc of 1% lidocaine were injected about the abscess site (anterolateral aspect of the knee at the level of the joint line) for local anesthetic after cleaning the skin with alcohol. The skin was then prepped in usual sterile fashion with betadine and then draped sterilely. There was a pre-existing ~1 x 1 cm area of skin necrosis where drainage of purulent material was occurring. This was incised using a scalpel.  There was immediate return of grossly purulent, foul-smelling pus.  Culture samples x 2 were obtained. A hemostat was used to break up any loculations and abscess pocket was found to extend ~4cm proximally and 2 cm distally subcutaneously.  The deep fascia was then debrided using the edge of a 15 blade and mechanically with a gauze sponge.  The abscess did not appear to violate the deep fascia.  there was no apparent extension deep to bone or into the knee joint. All purulent material was  expressed out.  It was then irrigated with normal saline with a syringe. Unhealthy appearing subcutaneous tissue was excised with a hemostat and knife.  Necrotic skin edges were also excised.  Additionally, mechanical debridement of deeper muscle/fascia tissue was performed by inserting a gauze pad deep into the pocket and then slowly pulling it out repeatedly. The wound was then irrigated again. Post-debridement size of wound was ~1 x 1 cm. The wound was packed with two 1/4" gauze strip strips, one packed proximally, and the other packed distally.  Wound was dressed with a pressure dressing and a compressive wrap. The patient tolerated the procedure adequately.   Signa Kell   06/21/2020 4:04 PM

## 2020-06-21 NOTE — Progress Notes (Signed)
Mobility Specialist - Progress Note   06/21/20 1448  Mobility  Activity Refused mobility  Mobility performed by Mobility specialist     Pt refused mobility d/t feeling tired. Will attempt mobility session at a later date/time.    Zalmen Wrightsman Mobility Specialist  06/21/20, 2:50 PM

## 2020-06-21 NOTE — ED Notes (Signed)
IV team at bedside 

## 2020-06-22 DIAGNOSIS — L0390 Cellulitis, unspecified: Secondary | ICD-10-CM

## 2020-06-22 LAB — CBC
HCT: 29.3 % — ABNORMAL LOW (ref 36.0–46.0)
Hemoglobin: 9.7 g/dL — ABNORMAL LOW (ref 12.0–15.0)
MCH: 31.5 pg (ref 26.0–34.0)
MCHC: 33.1 g/dL (ref 30.0–36.0)
MCV: 95.1 fL (ref 80.0–100.0)
Platelets: 226 10*3/uL (ref 150–400)
RBC: 3.08 MIL/uL — ABNORMAL LOW (ref 3.87–5.11)
RDW: 13.9 % (ref 11.5–15.5)
WBC: 9.4 10*3/uL (ref 4.0–10.5)
nRBC: 0 % (ref 0.0–0.2)

## 2020-06-22 LAB — COMPREHENSIVE METABOLIC PANEL
ALT: 24 U/L (ref 0–44)
AST: 35 U/L (ref 15–41)
Albumin: 2.3 g/dL — ABNORMAL LOW (ref 3.5–5.0)
Alkaline Phosphatase: 73 U/L (ref 38–126)
Anion gap: 11 (ref 5–15)
BUN: 7 mg/dL (ref 6–20)
CO2: 20 mmol/L — ABNORMAL LOW (ref 22–32)
Calcium: 8.1 mg/dL — ABNORMAL LOW (ref 8.9–10.3)
Chloride: 108 mmol/L (ref 98–111)
Creatinine, Ser: 0.57 mg/dL (ref 0.44–1.00)
GFR calc Af Amer: >60 mL/min (ref >60)
GFR calc non Af Amer: >60 mL/min (ref >60)
Glucose, Bld: 82 mg/dL (ref 70–99)
Potassium: 3.5 mmol/L (ref 3.5–5.1)
Sodium: 139 mmol/L (ref 135–145)
Total Bilirubin: 0.6 mg/dL (ref 0.3–1.2)
Total Protein: 5.6 g/dL — ABNORMAL LOW (ref 6.5–8.1)

## 2020-06-22 LAB — MAGNESIUM: Magnesium: 1.9 mg/dL (ref 1.7–2.4)

## 2020-06-22 LAB — PHOSPHORUS: Phosphorus: 2.9 mg/dL (ref 2.5–4.6)

## 2020-06-22 MED ORDER — HYDROCODONE-ACETAMINOPHEN 5-325 MG PO TABS
1.0000 | ORAL_TABLET | Freq: Four times a day (QID) | ORAL | Status: DC | PRN
  Administered 2020-06-22 – 2020-06-29 (×22): 1 via ORAL
  Filled 2020-06-22 (×23): qty 1

## 2020-06-22 MED ORDER — HYDROMORPHONE HCL 1 MG/ML IJ SOLN
0.4000 mg | Freq: Once | INTRAMUSCULAR | Status: AC
  Administered 2020-06-22: 0.4 mg via INTRAVENOUS
  Filled 2020-06-22: qty 0.5

## 2020-06-22 NOTE — Progress Notes (Signed)
PROGRESS NOTE    Lori Pollard  ZJQ:[id number] DOB: 1990/12/22 DOA: 06/20/2020 PCP: Patient, No Pcp Per   Brief Narrative: 29 year old with past medical history significant for narcotic abuse previously on Suboxone, polysubstance abuse, bipolar, PTSD who presented with worsening right knee pain and erythema.  Patient was trying to inject Suboxone film that she got from a friend through IV into her right lateral knee about a week ago.  She developed small induration area in her right knee that is spread to her thigh and leg. Presented with tachycardia, leukocytosis, lactic acid 2.6.  Potassium of 2.9.    Assessment & Plan:   Active Problems:   Cellulitis   Sepsis (HCC)   Chest pain   Polysubstance abuse (HCC)   Malnutrition of moderate degree  1-Sepsis secondary to cellulitis and abscess right LE  Patient presents with tachycardia, leukocytosis source for infection of right knee. MRI negative for septic knee, There is some fluid along the superficial fascia of the gastrocnemius muscle suggestive of fasciitis. Negative for myositis or gas along fascial planes to suggest necrotizing fasciitis. Underwent I and D at bedside, draining purulent secretion. Follow culture: growing abundant gram-positive cocci  and gram-negative rods Continue with vancomycin, cefepime and Flagyl. Redness has decreased.  Pain has improved.  2-Chest pain: EKG sinus rhythm troponin negative.  3-Hypokalemia: Resolved.   4-Polysubstance abuse: Endorses use of IV heroin.  TOC team has completed consulted  Estimated body mass index is 17.96 kg/m as calculated from the following:   Height as of this encounter: 4\' 11"  (1.499 m).   Weight as of this encounter: 40.3 kg.   DVT prophylaxis: Lovenox Code Status: Full code Family Communication: Care discussed with patient Disposition Plan:  Status is: Inpatient  Remains inpatient appropriate because:Hemodynamically unstable   Dispo: The patient  is from: Home              Anticipated d/c is to: To be determined              Anticipated d/c date is: 3 days              Patient currently is not medically stable to d/c.        Consultants:   Dr   Procedures:   none  Antimicrobials:    Subjective: Still complaining of right leg pain.  Right lower extremity redness and edema has decreased . Objective: Vitals:   06/21/20 1408 06/21/20 1946 06/22/20 0609 06/22/20 1159  BP: 102/74 105/73 103/74 93/66  Pulse: 93 (!) 107 75 83  Resp: 18 20 20    Temp: 98.7 F (37.1 C) 98.5 F (36.9 C) 98.2 F (36.8 C) 98.6 F (37 C)  TempSrc: Oral Oral Oral Oral  SpO2: 100% 99% 95% 99%  Weight:      Height:        Intake/Output Summary (Last 24 hours) at 06/22/2020 1553 Last data filed at 06/22/2020 1500 Gross per 24 hour  Intake 2700.84 ml  Output 1700 ml  Net 1000.84 ml   Filed Weights   06/20/20 1605 06/20/20 2143 06/21/20 0106  Weight: 48 kg 41.7 kg 40.3 kg    Examination:  General exam: NAD Respiratory system: CTA Cardiovascular system: S 1, S 2  RRR Gastrointestinal system: BS present, soft  nt Central nervous system: alert Extremities: Right lower extremity and knee with les redness and edema, dressing in place.    Data Reviewed: I have personally reviewed following labs and imaging studies  CBC:  Recent Labs  Lab 06/20/20 1607 06/21/20 0142 06/22/20 0655  WBC 16.8* 16.3* 9.4  NEUTROABS 14.3*  --   --   HGB 11.6* 10.1* 9.7*  HCT 35.3* 29.4* 29.3*  MCV 95.9 92.5 95.1  PLT 246 217 226   Basic Metabolic Panel: Recent Labs  Lab 06/20/20 1607 06/21/20 0142 06/22/20 0526  NA 139 140 139  K 2.9* 3.1* 3.5  CL 104 108 108  CO2 22 25 20*  GLUCOSE 119* 109* 82  BUN 10 10 7   CREATININE 0.82 0.72 0.57  CALCIUM 8.5* 7.6* 8.1*  MG  --   --  1.9  PHOS  --   --  2.9   GFR: Estimated Creatinine Clearance: 65.4 mL/min (by C-G formula based on SCr of 0.57 mg/dL). Liver Function Tests: Recent Labs    Lab 06/20/20 1607 06/22/20 0526  AST 26 35  ALT 15 24  ALKPHOS 81 73  BILITOT 0.6 0.6  PROT 6.9 5.6*  ALBUMIN 3.1* 2.3*   No results for input(s): LIPASE, AMYLASE in the last 168 hours. No results for input(s): AMMONIA in the last 168 hours. Coagulation Profile: Recent Labs  Lab 06/21/20 0142  INR 1.2   Cardiac Enzymes: No results for input(s): CKTOTAL, CKMB, CKMBINDEX, TROPONINI in the last 168 hours. BNP (last 3 results) No results for input(s): PROBNP in the last 8760 hours. HbA1C: No results for input(s): HGBA1C in the last 72 hours. CBG: No results for input(s): GLUCAP in the last 168 hours. Lipid Profile: No results for input(s): CHOL, HDL, LDLCALC, TRIG, CHOLHDL, LDLDIRECT in the last 72 hours. Thyroid Function Tests: No results for input(s): TSH, T4TOTAL, FREET4, T3FREE, THYROIDAB in the last 72 hours. Anemia Panel: No results for input(s): VITAMINB12, FOLATE, FERRITIN, TIBC, IRON, RETICCTPCT in the last 72 hours. Sepsis Labs: Recent Labs  Lab 06/20/20 1607 06/20/20 2138  LATICACIDVEN 2.6* 2.2*    Recent Results (from the past 240 hour(s))  Culture, blood (Routine x 2)     Status: None (Preliminary result)   Collection Time: 06/20/20  4:07 PM   Specimen: BLOOD  Result Value Ref Range Status   Specimen Description BLOOD BLOOD RIGHT HAND  Final   Special Requests   Final    BOTTLES DRAWN AEROBIC AND ANAEROBIC Blood Culture results may not be optimal due to an inadequate volume of blood received in culture bottles   Culture   Final    NO GROWTH 2 DAYS Performed at [facility], [address] [place], [place] [address]    Report Status PENDING  Incomplete  Culture, blood (Routine x 2)     Status: None (Preliminary result)   Collection Time: 06/20/20  9:39 PM   Specimen: BLOOD  Result Value Ref Range Status   Specimen Description BLOOD BLOOD LEFT FOREARM  Final   Special Requests   Final    BOTTLES DRAWN AEROBIC AND ANAEROBIC Blood Culture  adequate volume   Culture   Final    NO GROWTH 1 DAY Performed at [facility], [address] [place], [place] [address]    Report Status PENDING  Incomplete  SARS Coronavirus 2 by RT PCR (hospital order, performed in [facility] hospital lab) Nasopharyngeal Nasopharyngeal Swab     Status: None   Collection Time: 06/20/20 10:18 PM   Specimen: Nasopharyngeal Swab  Result Value Ref Range Status   SARS Coronavirus 2 NEGATIVE NEGATIVE Final    Comment: (NOTE) SARS-CoV-2 target nucleic acids are NOT DETECTED.  The  SARS-CoV-2 RNA is generally detectable in upper and lower respiratory specimens during the acute phase of infection. The lowest concentration of SARS-CoV-2 viral copies this assay can detect is 250 copies / mL. A negative result does not preclude SARS-CoV-2 infection and should not be used as the sole basis for treatment or other patient management decisions.  A negative result may occur with improper specimen collection / handling, submission of specimen other than nasopharyngeal swab, presence of viral mutation(s) within the areas targeted by this assay, and inadequate number of viral copies (<250 copies / mL). A negative result must be combined with clinical observations, patient history, and epidemiological information.  Fact Sheet for Patients:   BoilerBrush.com.cy  Fact Sheet for Healthcare Providers: https://pope.com/  This test is not yet approved or  cleared by the [place] FDA and has been authorized for detection and/or diagnosis of SARS-CoV-2 by FDA under an Emergency Use Authorization (EUA).  This EUA will remain in effect (meaning this test can be used) for the duration of the COVID-19 declaration under Section 564(b)(1) of the Act, 21 U.S.C. section 360bbb-3(b)(1), unless the authorization is terminated or revoked sooner.  Performed at [facility], [address]  [place], [place] [address]   Aerobic/Anaerobic Culture (surgical/deep wound)     Status: None (Preliminary result)   Collection Time: 06/21/20  4:08 PM   Specimen: Wound; Abscess  Result Value Ref Range Status   Specimen Description   Final    WOUND Performed at [facility], [address] [place], [place] [address]    Special Requests NO ANAEROBIC SWAB SENT  Final   Gram Stain   Final    RARE WBC PRESENT, PREDOMINANTLY PMN ABUNDANT GRAM POSITIVE COCCI ABUNDANT GRAM NEGATIVE RODS    Culture   Final    CULTURE REINCUBATED FOR BETTER GROWTH Performed at [facility], [address] [place], [place] [address]    Report Status PENDING  Incomplete         Radiology Studies: DG Chest 1 View  Result Date: 06/21/2020 CLINICAL DATA:  29 year old female with IV drug use, infection around the knee. EXAM: CHEST  1 VIEW COMPARISON:  Chest radiograph 05/11/2020. FINDINGS: Portable AP upright view at 0042 hours. Lung volumes and mediastinal contours remain normal. There is new diffuse pulmonary vascular congestion. No pleural fluid. No pneumothorax. No confluent pulmonary opacity. Stable mild thoracic scoliosis. No acute osseous abnormality identified. Negative visible bowel gas pattern. IMPRESSION: Pulmonary vascular congestion without overt edema or pleural effusion. Electronically Signed   By: Odessa Fleming M.D.   On: 06/21/2020 00:57   DG Knee 1-2 Views Right  Result Date: 06/21/2020 CLINICAL DATA:  29 year old female with IV drug use, infection around the knee. EXAM: RIGHT KNEE - 1-2 VIEW COMPARISON:  None. FINDINGS: Normal joint spaces and alignment. Bone mineralization is within normal limits. No evidence of joint effusion. Anterior, medial and lateral soft tissue swelling and stranding, with abnormal soft tissue gas tracking about the knee. No radiopaque foreign body identified. IMPRESSION: 1. Soft tissue swelling and multifocal soft tissue gas suspicious for a necrotizing or  gas-forming infection. 2. No osseous abnormality or joint effusion. Electronically Signed   By: Odessa Fleming M.D.   On: 06/21/2020 00:59   MR KNEE RIGHT WO CONTRAST  Result Date: 06/21/2020 CLINICAL DATA:  IV drug abuser with right knee pain, redness and swelling. EXAM: MRI OF THE RIGHT KNEE WITHOUT CONTRAST TECHNIQUE: Multiplanar, multisequence MR imaging of the knee was  performed. No intravenous contrast was administered. COMPARISON:  Plain films right knee 06/21/2020. FINDINGS: Scattered skin ulcerations about the knee are seen. Extensive subcutaneous edema is present about the knee. Small foci signal dropout in the subcutaneous fatty tissues correlate with soft tissue gas seen on the prior exam. No focal fluid collection is identified. There is some fluid about the gastrocnemius muscle. No evidence of gas tracking along fascial planes is identified. MENISCI Medial meniscus:  Intact. Lateral meniscus:  Intact. LIGAMENTS Cruciates:  Intact. Collaterals:  Intact. CARTILAGE Patellofemoral:  Normal. Medial:  Normal. Lateral:  Normal. Joint:  Trace joint effusion. Popliteal Fossa: No Baker's cyst. There is edema in fat in the popliteal fossa. Extensor Mechanism:  Intact. Bones:  Normal marrow signal throughout. Other: None. IMPRESSION: Negative for osteomyelitis or septic joint. Findings most consistent with intense cellulitis about the knee. Edema in fat of the popliteal fossa is also worrisome for infection. There is some fluid along the superficial fascia of the gastrocnemius muscle suggestive of fasciitis. Negative for myositis or gas along fascial planes to suggest necrotizing fasciitis. Electronically Signed   By: Drusilla Kanner M.D.   On: 06/21/2020 11:57        Scheduled Meds: . Chlorhexidine Gluconate Cloth  6 each Topical Daily  . enoxaparin (LOVENOX) injection  30 mg Subcutaneous Q24H  . feeding supplement (ENSURE ENLIVE)  237 mL Oral BID BM  . multivitamin with minerals  1 tablet Oral Daily    Continuous Infusions: . sodium chloride Stopped (06/21/20 1636)  . ceFEPime (MAXIPIME) IV Stopped (06/22/20 1416)  . metronidazole Stopped (06/22/20 1238)  . vancomycin Stopped (06/22/20 0501)     LOS: 2 days    Time spent: 35 minutes.     Alba Cory, MD [facility]   If 7PM-7AM, please contact night-coverage [email or web address]  06/22/2020, 3:53 PM

## 2020-06-22 NOTE — Progress Notes (Signed)
Pharmacy Antibiotic Note  Lori Pollard is a 29 y.o. female admitted on 06/20/2020 with cellulitis.  Pharmacy has been consulted for vanc/cefepime/flagyl dosing.  8/12 Confirmed neg pregnancy to allow maintenance of vancomycin.  Plan: Will continue vancomycin 500mg  IV q12h and cefepime 2g IV q8h and will continue to monitor clinical course and adjust doses per changes in renal function.  Goal trough 15 - 20 mcg/mL.  Height: 4\' 11"  (149.9 cm) Weight: 40.3 kg (88 lb 14.4 oz) IBW/kg (Calculated) : 43.2  Temp (24hrs), Avg:98.5 F (36.9 C), Min:98.2 F (36.8 C), Max:98.7 F (37.1 C)  Recent Labs  Lab 06/20/20 1607 06/20/20 2138 06/21/20 0142 06/22/20 0655  WBC 16.8*  --  16.3* 9.4  CREATININE 0.82  --  0.72  --   LATICACIDVEN 2.6* 2.2*  --   --     Estimated Creatinine Clearance: 65.4 mL/min (by C-G formula based on SCr of 0.72 mg/dL).    No Known Allergies   Antimicrobials this admission: 8/11 Cefepime>> 8/11 Vancomycin>> 8/12 Flagyl>>  Dose adjustments this admission:  Microbiology results 8/12 Wcx (abscess): GPC, GNR 8/12 Bcx: NGTD  Thank you for allowing pharmacy to be a part of this patient's care.  10/12, PharmD Pharmacy Resident  06/22/2020 12:38 PM

## 2020-06-22 NOTE — Progress Notes (Signed)
Patient notes some mild improvement in leg pain.   Gen: NAD RLE: 5/5 DF/PF/EHL SILT s/s/t/sp/dp distr Foot wwp RoM knee from 0-80 with increased pain greater than 80. Erythema about thigh and leg mildly improved I&D site with packing material in place with soaked gauze with purulent appearing material  Labs: WBC decreased to 9.4 from 16.3 yesterday  Recommendations: 1. Dressing was changed today. Packing material was left in place given that there was still some expressible purulent material.  Plan to hopefully remove tomorrow.  2.  Continue IV antibiotics per primary team.    Initial wound culture showed gram-negative rods and gram-positive cocci.  3.  Recommend wound care consult for possible VAC placement once infection is cleared as the patient has a an approximately 1 x 1 cm skin defect.

## 2020-06-23 LAB — CBC
HCT: 27.7 % — ABNORMAL LOW (ref 36.0–46.0)
Hemoglobin: 9.6 g/dL — ABNORMAL LOW (ref 12.0–15.0)
MCH: 31.6 pg (ref 26.0–34.0)
MCHC: 34.7 g/dL (ref 30.0–36.0)
MCV: 91.1 fL (ref 80.0–100.0)
Platelets: 261 10*3/uL (ref 150–400)
RBC: 3.04 MIL/uL — ABNORMAL LOW (ref 3.87–5.11)
RDW: 13.8 % (ref 11.5–15.5)
WBC: 11.7 10*3/uL — ABNORMAL HIGH (ref 4.0–10.5)
nRBC: 0 % (ref 0.0–0.2)

## 2020-06-23 LAB — BASIC METABOLIC PANEL
Anion gap: 9 (ref 5–15)
BUN: 7 mg/dL (ref 6–20)
CO2: 23 mmol/L (ref 22–32)
Calcium: 7.9 mg/dL — ABNORMAL LOW (ref 8.9–10.3)
Chloride: 104 mmol/L (ref 98–111)
Creatinine, Ser: 0.52 mg/dL (ref 0.44–1.00)
GFR calc Af Amer: >60 mL/min (ref >60)
GFR calc non Af Amer: >60 mL/min (ref >60)
Glucose, Bld: 97 mg/dL (ref 70–99)
Potassium: 3.1 mmol/L — ABNORMAL LOW (ref 3.5–5.1)
Sodium: 136 mmol/L (ref 135–145)

## 2020-06-23 MED ORDER — POTASSIUM CHLORIDE CRYS ER 20 MEQ PO TBCR
40.0000 meq | EXTENDED_RELEASE_TABLET | ORAL | Status: AC
  Administered 2020-06-23 (×2): 40 meq via ORAL
  Filled 2020-06-23 (×2): qty 2

## 2020-06-23 MED ORDER — SENNA 8.6 MG PO TABS
1.0000 | ORAL_TABLET | Freq: Every day | ORAL | Status: DC
  Administered 2020-06-23 – 2020-06-29 (×6): 8.6 mg via ORAL
  Filled 2020-06-23 (×6): qty 1

## 2020-06-23 MED ORDER — NICOTINE 21 MG/24HR TD PT24
21.0000 mg | MEDICATED_PATCH | Freq: Every day | TRANSDERMAL | Status: DC
  Administered 2020-06-23 – 2020-06-29 (×7): 21 mg via TRANSDERMAL
  Filled 2020-06-23 (×7): qty 1

## 2020-06-23 NOTE — Progress Notes (Signed)
Pharmacy Antibiotic Note  Lori Pollard is a 29 y.o. female admitted on 06/20/2020 with cellulitis.  Pharmacy has been consulted for vanc/cefepime/flagyl dosing.  8/12 Confirmed neg pregnancy to allow maintenance of vancomycin.  Plan: Renal function stable. Continue vancomycin 500mg  IV q12h and cefepime 2g IV q8h.  Continue to monitor clinical course. If prolonged course of vancomycin expected or change in renal function, will check vancomycin trough as indicated.  Goal trough 15 - 20 mcg/mL.  Height: 4\' 11"  (149.9 cm) Weight: 40.3 kg (88 lb 14.4 oz) IBW/kg (Calculated) : 43.2  Temp (24hrs), Avg:99.2 F (37.3 C), Min:98.6 F (37 C), Max:99.8 F (37.7 C)  Recent Labs  Lab 06/20/20 1607 06/20/20 2138 06/21/20 0142 06/22/20 0526 06/22/20 0655 06/23/20 0634  WBC 16.8*  --  16.3*  --  9.4 11.7*  CREATININE 0.82  --  0.72 0.57  --  0.52  LATICACIDVEN 2.6* 2.2*  --   --   --   --     Estimated Creatinine Clearance: 65.4 mL/min (by C-G formula based on SCr of 0.52 mg/dL).    No Known Allergies   Antimicrobials this admission: 8/11 Cefepime>> 8/11 Vancomycin>> 8/12 Flagyl>>  Dose adjustments this admission:  Microbiology results 8/12 Wcx (abscess): GPC, GNR (gram stain) 8/12 Bcx: NGTD  Thank you for allowing pharmacy to be a part of this patients care.  10/12, PharmD 06/23/2020 10:50 AM

## 2020-06-23 NOTE — Progress Notes (Signed)
  Subjective:   s/p bedside incision and drainage of right lateral knee abscess. Patient reports pain as moderate does feel that it is improving.  Negative for chest pain and shortness of breath Fever: Most recent temp of 99.8 Gastrointestinal:Negative for nausea and vomiting  Objective: Vital signs in last 24 hours: Temp:  [98.6 F (37 C)-99.8 F (37.7 C)] 99.8 F (37.7 C) (08/14 0424) Pulse Rate:  [74-87] 74 (08/14 0424) Resp:  [16] 16 (08/14 0424) BP: (93-116)/(66-74) 116/69 (08/14 0424) SpO2:  [99 %-100 %] 100 % (08/14 0424)  Intake/Output from previous day:  Intake/Output Summary (Last 24 hours) at 06/23/2020 0854 Last data filed at 06/23/2020 0300 Gross per 24 hour  Intake 831.43 ml  Output 3500 ml  Net -2668.57 ml    Intake/Output this shift: No intake/output data recorded.  Labs: Recent Labs    06/20/20 1607 06/21/20 0142 06/22/20 0655 06/23/20 0634  HGB 11.6* 10.1* 9.7* 9.6*   Recent Labs    06/22/20 0655 06/23/20 0634  WBC 9.4 11.7*  RBC 3.08* 3.04*  HCT 29.3* 27.7*  PLT 226 261   Recent Labs    06/22/20 0526 06/23/20 0634  NA 139 136  K 3.5 3.1*  CL 108 104  CO2 20* 23  BUN 7 7  CREATININE 0.57 0.52  GLUCOSE 82 97  CALCIUM 8.1* 7.9*   Recent Labs    06/21/20 0142  INR 1.2     EXAM General - Patient is Alert, Appropriate and Oriented  Bulky dressing applied to the right knee was removed, still purulent drainage noted.   Erythema no longer running into the thigh or down leg, focal area of erythema around the abscess. Patient is able to flex and extend knee with increased pain.  Able to dorsiflex and plantarflex the toes and ankle. Intact to light touch to the right leg. Swelling noted in the right foot, negative Homans. Packing material was advanced 2 cm, prior to a new bulky dressing being applied.  Past Medical History:  Diagnosis Date  . Cervicalgia   . History of chickenpox   . Hives     Assessment/Plan:    Active  Problems:   Cellulitis   Sepsis (HCC)   Chest pain   Polysubstance abuse (HCC)   Malnutrition of moderate degree  Estimated body mass index is 17.96 kg/m as calculated from the following:   Height as of this encounter: 4\' 11"  (1.499 m).   Weight as of this encounter: 40.3 kg.  Patient states that her pain is improving and also reports that the knee looks much improved. WBC this AM, 11.7, continue to monitor. Will plan on removing packing material tomorrow if drainage is improving.  May need to add additional packing material based on appearance. Continue with IV Vancomycin, Cefepime and Flagyl until cultures return.  DVT Prophylaxis - Lovenox Weight-Bearing as tolerated to right leg  J. , PA-C [facility] 06/23/2020, 8:54 AM

## 2020-06-23 NOTE — Progress Notes (Signed)
PROGRESS NOTE    Lori Pollard  KWI:[id number] DOB: 21-Jul-1991 DOA: 06/20/2020 PCP: Patient, No Pcp Per   Brief Narrative: 29 year old with past medical history significant for narcotic abuse previously on Suboxone, polysubstance abuse, bipolar, PTSD who presented with worsening right knee pain and erythema.  Patient was trying to inject Suboxone film that she got from a friend through IV into her right lateral knee about a week ago.  She developed small induration area in her right knee that is spread to her thigh and leg. Presented with tachycardia, leukocytosis, lactic acid 2.6.  Potassium of 2.9.    Assessment & Plan:   Active Problems:   Cellulitis   Sepsis (HCC)   Chest pain   Polysubstance abuse (HCC)   Malnutrition of moderate degree  1-Sepsis secondary to cellulitis and abscess right LE  Patient presents with tachycardia, leukocytosis source for infection of right knee. MRI negative for septic knee, There is some fluid along the superficial fascia of the gastrocnemius muscle suggestive of fasciitis. Negative for myositis or gas along fascial planes to suggest necrotizing fasciitis. Underwent I and D at bedside, draining purulent secretion. Follow culture: growing abundant gram-positive cocci  and gram-negative rods. Culture growing few staph aureous.  Continue with vancomycin, cefepime and Flagyl. Orthopedic change dressing and re apply packing.  Vitals stable, afebrile.   2-Chest pain: Resolved EKG sinus rhythm troponin negative.  3-Hypokalemia: Resolved.   4-Polysubstance abuse: Endorses use of IV heroin.  TOC team has completed consulted  Estimated body mass index is 17.96 kg/m as calculated from the following:   Height as of this encounter: 4\' 11"  (1.499 m).   Weight as of this encounter: 40.3 kg.   DVT prophylaxis: Lovenox Code Status: Full code Family Communication: Care discussed with patient Disposition Plan:  Status is: Inpatient  Remains  inpatient appropriate because:Hemodynamically unstable   Dispo: The patient is from: Home              Anticipated d/c is to: To be determined              Anticipated d/c date is: 3 days              Patient currently is not medically stable to d/c.        Consultants:   Dr   Procedures:   none  Antimicrobials:    Subjective: Right leg pain improving.  Erythema redness decreasing.  Objective: Vitals:   06/22/20 0609 06/22/20 1159 06/22/20 1934 06/23/20 0424  BP: 103/74 93/66 107/74 116/69  Pulse: 75 83 87 74  Resp: 20   16  Temp: 98.2 F (36.8 C) 98.6 F (37 C) 99.1 F (37.3 C) 99.8 F (37.7 C)  TempSrc: Oral Oral Oral Oral  SpO2: 95% 99% 100% 100%  Weight:      Height:        Intake/Output Summary (Last 24 hours) at 06/23/2020 1407 Last data filed at 06/23/2020 0300 Gross per 24 hour  Intake 711.43 ml  Output 3000 ml  Net -2288.57 ml   Filed Weights   06/20/20 1605 06/20/20 2143 06/21/20 0106  Weight: 48 kg 41.7 kg 40.3 kg    Examination:  General exam: NAD Respiratory system: CTA Cardiovascular system: S 1, S 2 RRR Gastrointestinal system: BS present, soft, nt Central nervous system: Alert Extremities: Right lower extremity and knee with les redness and edema, dressing in place.    Data Reviewed: I have personally reviewed following labs and imaging studies  CBC: Recent Labs  Lab 06/20/20 1607 06/21/20 0142 06/22/20 0655 06/23/20 0634  WBC 16.8* 16.3* 9.4 11.7*  NEUTROABS 14.3*  --   --   --   HGB 11.6* 10.1* 9.7* 9.6*  HCT 35.3* 29.4* 29.3* 27.7*  MCV 95.9 92.5 95.1 91.1  PLT 246 217 226 261   Basic Metabolic Panel: Recent Labs  Lab 06/20/20 1607 06/21/20 0142 06/22/20 0526 06/23/20 0634  NA 139 140 139 136  K 2.9* 3.1* 3.5 3.1*  CL 104 108 108 104  CO2 22 25 20* 23  GLUCOSE 119* 109* 82 97  BUN 10 10 7 7   CREATININE 0.82 0.72 0.57 0.52  CALCIUM 8.5* 7.6* 8.1* 7.9*  MG  --   --  1.9  --   PHOS  --   --  2.9  --     GFR: Estimated Creatinine Clearance: 65.4 mL/min (by C-G formula based on SCr of 0.52 mg/dL). Liver Function Tests: Recent Labs  Lab 06/20/20 1607 06/22/20 0526  AST 26 35  ALT 15 24  ALKPHOS 81 73  BILITOT 0.6 0.6  PROT 6.9 5.6*  ALBUMIN 3.1* 2.3*   No results for input(s): LIPASE, AMYLASE in the last 168 hours. No results for input(s): AMMONIA in the last 168 hours. Coagulation Profile: Recent Labs  Lab 06/21/20 0142  INR 1.2   Cardiac Enzymes: No results for input(s): CKTOTAL, CKMB, CKMBINDEX, TROPONINI in the last 168 hours. BNP (last 3 results) No results for input(s): PROBNP in the last 8760 hours. HbA1C: No results for input(s): HGBA1C in the last 72 hours. CBG: No results for input(s): GLUCAP in the last 168 hours. Lipid Profile: No results for input(s): CHOL, HDL, LDLCALC, TRIG, CHOLHDL, LDLDIRECT in the last 72 hours. Thyroid Function Tests: No results for input(s): TSH, T4TOTAL, FREET4, T3FREE, THYROIDAB in the last 72 hours. Anemia Panel: No results for input(s): VITAMINB12, FOLATE, FERRITIN, TIBC, IRON, RETICCTPCT in the last 72 hours. Sepsis Labs: Recent Labs  Lab 06/20/20 1607 06/20/20 2138  LATICACIDVEN 2.6* 2.2*    Recent Results (from the past 240 hour(s))  Culture, blood (Routine x 2)     Status: None (Preliminary result)   Collection Time: 06/20/20  4:07 PM   Specimen: BLOOD  Result Value Ref Range Status   Specimen Description BLOOD BLOOD RIGHT HAND  Final   Special Requests   Final    BOTTLES DRAWN AEROBIC AND ANAEROBIC Blood Culture results may not be optimal due to an inadequate volume of blood received in culture bottles   Culture   Final    NO GROWTH 3 DAYS Performed at [facility], [address] [place], [place]    Report Status PENDING  Incomplete  Culture, blood (Routine x 2)     Status: None (Preliminary result)   Collection Time: 06/20/20  9:39 PM   Specimen: BLOOD  Result Value Ref Range Status    Specimen Description BLOOD BLOOD LEFT FOREARM  Final   Special Requests   Final    BOTTLES DRAWN AEROBIC AND ANAEROBIC Blood Culture adequate volume   Culture   Final    NO GROWTH 2 DAYS Performed at [facility], [address], [place], [place]    Report Status PENDING  Incomplete  SARS Coronavirus 2 by RT PCR (hospital order, performed in [facility] hospital lab) Nasopharyngeal Nasopharyngeal Swab     Status: None   Collection Time: 06/20/20 10:18 PM   Specimen: Nasopharyngeal Swab  Result  Value Ref Range Status   SARS Coronavirus 2 NEGATIVE NEGATIVE Final    Comment: (NOTE) SARS-CoV-2 target nucleic acids are NOT DETECTED.  The SARS-CoV-2 RNA is generally detectable in upper and lower respiratory specimens during the acute phase of infection. The lowest concentration of SARS-CoV-2 viral copies this assay can detect is 250 copies / mL. A negative result does not preclude SARS-CoV-2 infection and should not be used as the sole basis for treatment or other patient management decisions.  A negative result may occur with improper specimen collection / handling, submission of specimen other than nasopharyngeal swab, presence of viral mutation(s) within the areas targeted by this assay, and inadequate number of viral copies (<250 copies / mL). A negative result must be combined with clinical observations, patient history, and epidemiological information.  Fact Sheet for Patients:   BoilerBrush.com.cy  Fact Sheet for Healthcare Providers: https://pope.com/  This test is not yet approved or  cleared by the [place] FDA and has been authorized for detection and/or diagnosis of SARS-CoV-2 by FDA under an Emergency Use Authorization (EUA).  This EUA will remain in effect (meaning this test can be used) for the duration of the COVID-19 declaration under Section 564(b)(1) of the Act, 21 U.S.C. section  360bbb-3(b)(1), unless the authorization is terminated or revoked sooner.  Performed at [facility], [address] [place], [place] [address]   Aerobic/Anaerobic Culture (surgical/deep wound)     Status: None (Preliminary result)   Collection Time: 06/21/20  4:08 PM   Specimen: Wound; Abscess  Result Value Ref Range Status   Specimen Description   Final    WOUND Performed at [facility], [address] [place], [place] [address]    Special Requests NO ANAEROBIC SWAB SENT  Final   Gram Stain   Final    RARE WBC PRESENT, PREDOMINANTLY PMN ABUNDANT GRAM POSITIVE COCCI ABUNDANT GRAM NEGATIVE RODS Performed at [facility], [address], [place], [place] [address]    Culture   Final    FEW STAPHYLOCOCCUS AUREUS NO ANAEROBES ISOLATED; CULTURE IN PROGRESS FOR 5 DAYS    Report Status PENDING  Incomplete         Radiology Studies: No results found.      Scheduled Meds:  Chlorhexidine Gluconate Cloth  6 each Topical Daily   enoxaparin (LOVENOX) injection  30 mg Subcutaneous Q24H   feeding supplement (ENSURE ENLIVE)  237 mL Oral BID BM   multivitamin with minerals  1 tablet Oral Daily   Continuous Infusions:  sodium chloride 100 mL/hr at 06/23/20 0324   ceFEPime (MAXIPIME) IV 2 g (06/23/20 1244)   metronidazole 500 mg (06/23/20 0829)   vancomycin 500 mg (06/23/20 0328)     LOS: 3 days    Time spent: 35 minutes.     Alba Cory, MD [facility]   If 7PM-7AM, please contact night-coverage [email or web address]  06/23/2020, 2:07 PM

## 2020-06-24 ENCOUNTER — Inpatient Hospital Stay: Payer: Medicaid Other

## 2020-06-24 LAB — IRON AND TIBC
Iron: 21 ug/dL — ABNORMAL LOW (ref 28–170)
Saturation Ratios: 12 % (ref 10.4–31.8)
TIBC: 175 ug/dL — ABNORMAL LOW (ref 250–450)
UIBC: 154 ug/dL

## 2020-06-24 LAB — CBC
HCT: 29.5 % — ABNORMAL LOW (ref 36.0–46.0)
Hemoglobin: 9.9 g/dL — ABNORMAL LOW (ref 12.0–15.0)
MCH: 31.3 pg (ref 26.0–34.0)
MCHC: 33.6 g/dL (ref 30.0–36.0)
MCV: 93.4 fL (ref 80.0–100.0)
Platelets: 301 10*3/uL (ref 150–400)
RBC: 3.16 MIL/uL — ABNORMAL LOW (ref 3.87–5.11)
RDW: 13.6 % (ref 11.5–15.5)
WBC: 11.9 10*3/uL — ABNORMAL HIGH (ref 4.0–10.5)
nRBC: 0 % (ref 0.0–0.2)

## 2020-06-24 LAB — BASIC METABOLIC PANEL
Anion gap: 10 (ref 5–15)
BUN: 6 mg/dL (ref 6–20)
CO2: 24 mmol/L (ref 22–32)
Calcium: 8.4 mg/dL — ABNORMAL LOW (ref 8.9–10.3)
Chloride: 104 mmol/L (ref 98–111)
Creatinine, Ser: 0.58 mg/dL (ref 0.44–1.00)
GFR calc Af Amer: >60 mL/min (ref >60)
GFR calc non Af Amer: >60 mL/min (ref >60)
Glucose, Bld: 96 mg/dL (ref 70–99)
Potassium: 3.3 mmol/L — ABNORMAL LOW (ref 3.5–5.1)
Sodium: 138 mmol/L (ref 135–145)

## 2020-06-24 LAB — FOLATE: Folate: 10.2 ng/mL (ref >5.9)

## 2020-06-24 LAB — RETICULOCYTES
Immature Retic Fract: 8.8 % (ref 2.3–15.9)
RBC.: 3.15 MIL/uL — ABNORMAL LOW (ref 3.87–5.11)
Retic Count, Absolute: 18 10*3/uL — ABNORMAL LOW (ref 19.0–186.0)
Retic Ct Pct: 0.6 % (ref 0.4–3.1)

## 2020-06-24 LAB — VITAMIN B12: Vitamin B-12: 416 pg/mL (ref 180–914)

## 2020-06-24 LAB — FERRITIN: Ferritin: 92 ng/mL (ref 11–307)

## 2020-06-24 MED ORDER — POTASSIUM CHLORIDE CRYS ER 20 MEQ PO TBCR
40.0000 meq | EXTENDED_RELEASE_TABLET | ORAL | Status: AC
  Administered 2020-06-24 (×2): 40 meq via ORAL
  Filled 2020-06-24 (×2): qty 2

## 2020-06-24 MED ORDER — ALPRAZOLAM 0.5 MG PO TABS
0.5000 mg | ORAL_TABLET | Freq: Once | ORAL | Status: AC
  Administered 2020-06-24: 0.5 mg via ORAL
  Filled 2020-06-24: qty 1

## 2020-06-24 MED ORDER — FERROUS SULFATE 325 (65 FE) MG PO TABS
325.0000 mg | ORAL_TABLET | Freq: Every day | ORAL | Status: DC
  Administered 2020-06-24 – 2020-06-29 (×5): 325 mg via ORAL
  Filled 2020-06-24 (×5): qty 1

## 2020-06-24 MED ORDER — CEFAZOLIN SODIUM-DEXTROSE 2-4 GM/100ML-% IV SOLN
2.0000 g | Freq: Three times a day (TID) | INTRAVENOUS | Status: DC
  Administered 2020-06-24 – 2020-06-26 (×5): 2 g via INTRAVENOUS
  Filled 2020-06-24 (×9): qty 100

## 2020-06-24 NOTE — Progress Notes (Signed)
PROGRESS NOTE    Lori Pollard  WCH:[id number] DOB: 1991/03/29 DOA: 06/20/2020 PCP: Lori Pollard, No Pcp Per   Brief Narrative: 29 year old with past medical history significant for narcotic abuse previously on Suboxone, polysubstance abuse, bipolar, PTSD who presented with worsening right knee pain and erythema.  Lori Pollard was trying to inject Suboxone film that she got from a friend through IV into her right lateral knee about a week ago.  She developed small induration area in her right knee that is spread to her thigh and leg. Presented with tachycardia, leukocytosis, lactic acid 2.6.  Potassium of 2.9.    Assessment & Plan:   Active Problems:   Cellulitis   Sepsis (HCC)   Chest pain   Polysubstance abuse (HCC)   Malnutrition of moderate degree  1-Sepsis secondary to cellulitis and abscess right LE  Lori Pollard presents with tachycardia, leukocytosis source for infection of right knee. MRI negative for septic knee, There is some fluid along the superficial fascia of the gastrocnemius muscle suggestive of fasciitis. Negative for myositis or gas along fascial planes to suggest necrotizing fasciitis. Underwent I and D at bedside, draining purulent secretion. Follow culture: growing abundant gram-positive cocci  and gram-negative rods. Culture growing few staph aureous. MSSA, Streptococcus.  Treated initially with vancomycin, cefepime and Flagyl. Change antibiotics to Ancef and continue with flagyl.  Orthopedic change dressing and re apply packing.  Vitals stable, afebrile.  Still with significant drainage from wound. Plan to repeat MRI  2-Chest pain: Resolved EKG sinus rhythm troponin negative.  3-Hypokalemia: Replete with 40 Me times 2.   4-Polysubstance abuse: Endorses use of IV heroin.  TOC team has completed consulted  Estimated body mass index is 17.96 kg/m as calculated from the following:   Height as of this encounter: 4\' 11"  (1.499 m).   Weight as of this encounter:  40.3 kg.   DVT prophylaxis: Lovenox Code Status: Full code Family Communication: Care discussed with Lori Pollard Disposition Plan:  Status is: Inpatient  Remains inpatient appropriate because:Hemodynamically unstable   Dispo: The Lori Pollard is from: Home              Anticipated d/c is to: To be determined              Anticipated d/c date is: 3 days              Lori Pollard currently is not medically stable to d/c.        Consultants:   Dr   Procedures:   none  Antimicrobials:    Subjective: Pain persist, redness and swelling decreasing.  Still with significant drainage from wound.   Objective: Vitals:   06/23/20 1217 06/23/20 1230 06/23/20 1702 06/24/20 0359  BP: 138/70 109/74 117/78 122/88  Pulse: (!) 58 78 88 83  Resp: 20 20 16 18   Temp: 98.3 F (36.8 C) 98.4 F (36.9 C) 99.8 F (37.7 C) 98.7 F (37.1 C)  TempSrc: Oral Oral Oral Oral  SpO2: 95% 100% 99% 100%  Weight:      Height:        Intake/Output Summary (Last 24 hours) at 06/24/2020 1224 Last data filed at 06/24/2020 0518 Gross per 24 hour  Intake 0 ml  Output 800 ml  Net -800 ml   Filed Weights   06/20/20 1605 06/20/20 2143 06/21/20 0106  Weight: 48 kg 41.7 kg 40.3 kg    Examination:  General exam: NAD Respiratory system: CTA Cardiovascular system: S 1, S 2 RRR Gastrointestinal system: BS present,  soft, nt Central nervous system: Alert Extremities: Right LE with dressing, and soak with purulent material.    Data Reviewed: I have personally reviewed following labs and imaging studies  CBC: Recent Labs  Lab 06/20/20 1607 06/21/20 0142 06/22/20 0655 06/23/20 0634 06/24/20 0500  WBC 16.8* 16.3* 9.4 11.7* 11.9*  NEUTROABS 14.3*  --   --   --   --   HGB 11.6* 10.1* 9.7* 9.6* 9.9*  HCT 35.3* 29.4* 29.3* 27.7* 29.5*  MCV 95.9 92.5 95.1 91.1 93.4  PLT 246 217 226 261 301   Basic Metabolic Panel: Recent Labs  Lab 06/20/20 1607 06/21/20 0142 06/22/20 0526 06/23/20 0634  06/24/20 0500  NA 139 140 139 136 138  K 2.9* 3.1* 3.5 3.1* 3.3*  CL 104 108 108 104 104  CO2 22 25 20* 23 24  GLUCOSE 119* 109* 82 97 96  BUN 10 10 7 7 6   CREATININE 0.82 0.72 0.57 0.52 0.58  CALCIUM 8.5* 7.6* 8.1* 7.9* 8.4*  MG  --   --  1.9  --   --   PHOS  --   --  2.9  --   --    GFR: Estimated Creatinine Clearance: 65.4 mL/min (by C-G formula based on SCr of 0.58 mg/dL). Liver Function Tests: Recent Labs  Lab 06/20/20 1607 06/22/20 0526  AST 26 35  ALT 15 24  ALKPHOS 81 73  BILITOT 0.6 0.6  PROT 6.9 5.6*  ALBUMIN 3.1* 2.3*   No results for input(s): LIPASE, AMYLASE in the last 168 hours. No results for input(s): AMMONIA in the last 168 hours. Coagulation Profile: Recent Labs  Lab 06/21/20 0142  INR 1.2   Cardiac Enzymes: No results for input(s): CKTOTAL, CKMB, CKMBINDEX, TROPONINI in the last 168 hours. BNP (last 3 results) No results for input(s): PROBNP in the last 8760 hours. HbA1C: No results for input(s): HGBA1C in the last 72 hours. CBG: No results for input(s): GLUCAP in the last 168 hours. Lipid Profile: No results for input(s): CHOL, HDL, LDLCALC, TRIG, CHOLHDL, LDLDIRECT in the last 72 hours. Thyroid Function Tests: No results for input(s): TSH, T4TOTAL, FREET4, T3FREE, THYROIDAB in the last 72 hours. Anemia Panel: Recent Labs    06/24/20 0500  FOLATE 10.2  FERRITIN 92  TIBC 175*  IRON 21*  RETICCTPCT 0.6   Sepsis Labs: Recent Labs  Lab 06/20/20 1607 06/20/20 2138  LATICACIDVEN 2.6* 2.2*    Recent Results (from the past 240 hour(s))  Culture, blood (Routine x 2)     Status: None (Preliminary result)   Collection Time: 06/20/20  4:07 PM   Specimen: BLOOD  Result Value Ref Range Status   Specimen Description BLOOD BLOOD RIGHT HAND  Final   Special Requests   Final    BOTTLES DRAWN AEROBIC AND ANAEROBIC Blood Culture results may not be optimal due to an inadequate volume of blood received in culture bottles   Culture   Final    NO  GROWTH 4 DAYS Performed at [facility], [address] [place], [place] [address]    Report Status PENDING  Incomplete  Culture, blood (Routine x 2)     Status: None (Preliminary result)   Collection Time: 06/20/20  9:39 PM   Specimen: BLOOD  Result Value Ref Range Status   Specimen Description BLOOD BLOOD LEFT FOREARM  Final   Special Requests   Final    BOTTLES DRAWN AEROBIC AND ANAEROBIC Blood Culture adequate volume   Culture  Final    NO GROWTH 3 DAYS Performed at [facility], [address] [place], [place] [address]    Report Status PENDING  Incomplete  SARS Coronavirus 2 by RT PCR (hospital order, performed in [facility] hospital lab) Nasopharyngeal Nasopharyngeal Swab     Status: None   Collection Time: 06/20/20 10:18 PM   Specimen: Nasopharyngeal Swab  Result Value Ref Range Status   SARS Coronavirus 2 NEGATIVE NEGATIVE Final    Comment: (NOTE) SARS-CoV-2 target nucleic acids are NOT DETECTED.  The SARS-CoV-2 RNA is generally detectable in upper and lower respiratory specimens during the acute phase of infection. The lowest concentration of SARS-CoV-2 viral copies this assay can detect is 250 copies / mL. A negative result does not preclude SARS-CoV-2 infection and should not be used as the sole basis for treatment or other Lori Pollard management decisions.  A negative result may occur with improper specimen collection / handling, submission of specimen other than nasopharyngeal swab, presence of viral mutation(s) within the areas targeted by this assay, and inadequate number of viral copies (<250 copies / mL). A negative result must be combined with clinical observations, Lori Pollard history, and epidemiological information.  Fact Sheet for Patients:   BoilerBrush.com.cy  Fact Sheet for Healthcare Providers: https://pope.com/  This test is not yet approved or  cleared by the [place] FDA  and has been authorized for detection and/or diagnosis of SARS-CoV-2 by FDA under an Emergency Use Authorization (EUA).  This EUA will remain in effect (meaning this test can be used) for the duration of the COVID-19 declaration under Section 564(b)(1) of the Act, 21 U.S.C. section 360bbb-3(b)(1), unless the authorization is terminated or revoked sooner.  Performed at [facility], [address] [place], [place] [address]   Aerobic/Anaerobic Culture (surgical/deep wound)     Status: None (Preliminary result)   Collection Time: 06/21/20  4:08 PM   Specimen: Wound; Abscess  Result Value Ref Range Status   Specimen Description   Final    WOUND Performed at [facility], [address] [place], [place] [address]    Special Requests NO ANAEROBIC SWAB SENT  Final   Gram Stain   Final    RARE WBC PRESENT, PREDOMINANTLY PMN ABUNDANT GRAM POSITIVE COCCI ABUNDANT GRAM NEGATIVE RODS Performed at [facility], [address], [place], [place] [address]    Culture   Final    FEW STAPHYLOCOCCUS AUREUS FEW STREPTOCOCCUS GROUP C NO ANAEROBES ISOLATED; CULTURE IN PROGRESS FOR 5 DAYS    Report Status PENDING  Incomplete   Organism ID, Bacteria STAPHYLOCOCCUS AUREUS  Final      Susceptibility   Staphylococcus aureus - MIC*    CIPROFLOXACIN <=0.5 SENSITIVE Sensitive     ERYTHROMYCIN <=0.25 SENSITIVE Sensitive     GENTAMICIN <=0.5 SENSITIVE Sensitive     OXACILLIN 0.5 SENSITIVE Sensitive     TETRACYCLINE <=1 SENSITIVE Sensitive     VANCOMYCIN <=0.5 SENSITIVE Sensitive     TRIMETH/SULFA <=10 SENSITIVE Sensitive     CLINDAMYCIN <=0.25 SENSITIVE Sensitive     RIFAMPIN <=0.5 SENSITIVE Sensitive     Inducible Clindamycin NEGATIVE Sensitive     * FEW STAPHYLOCOCCUS AUREUS         Radiology Studies: No results found.      Scheduled Meds: . Chlorhexidine Gluconate Cloth  6 each Topical Daily  . enoxaparin (LOVENOX) injection  30 mg Subcutaneous Q24H  .  feeding supplement (ENSURE ENLIVE)  237 mL Oral BID BM  .  ferrous sulfate  325 mg Oral Q breakfast  . multivitamin with minerals  1 tablet Oral Daily  . nicotine  21 mg Transdermal Daily  . senna  1 tablet Oral Daily   Continuous Infusions: . sodium chloride 100 mL/hr at 06/24/20 0606  . ceFEPime (MAXIPIME) IV 2 g (06/24/20 0443)  . metronidazole 500 mg (06/24/20 0852)  . vancomycin 500 mg (06/24/20 0303)     LOS: 4 days    Time spent: 35 minutes.     Alba Cory, MD [facility]   If 7PM-7AM, please contact night-coverage [email or web address]  06/24/2020, 12:24 PM

## 2020-06-24 NOTE — Progress Notes (Signed)
Pharmacy Antibiotic Note  Lori Pollard is a 29 y.o. female admitted on 06/20/2020 with cellulitis.  Pharmacy has been consulted for vanc/cefepime/flagyl dosing.  8/12 Confirmed neg pregnancy to allow maintenance of vancomycin.  Plan: Renal function stable. Continue vancomycin 500mg  IV q12h and cefepime 2g IV q8h.  Per Orthopedic note today, significant purulent drainage from site of previous bedside I&D. Patient to have repeat MRI of knee with possible repeat I&D tomorrow.    Continue to monitor clinical course. Due to unknown duration of therapy, will check vancomycin trough with morning dose as patient has now had approximately 6 doses of vanc.  Goal trough 15 - 20 mcg/mL  Height: 4\' 11"  (149.9 cm) Weight: 40.3 kg (88 lb 14.4 oz) IBW/kg (Calculated) : 43.2  Temp (24hrs), Avg:98.8 F (37.1 C), Min:98.3 F (36.8 C), Max:99.8 F (37.7 C)  Recent Labs  Lab 06/20/20 1607 06/20/20 2138 06/21/20 0142 06/22/20 0526 06/22/20 0655 06/23/20 0634 06/24/20 0500  WBC 16.8*  --  16.3*  --  9.4 11.7* 11.9*  CREATININE 0.82  --  0.72 0.57  --  0.52 0.58  LATICACIDVEN 2.6* 2.2*  --   --   --   --   --     Estimated Creatinine Clearance: 65.4 mL/min (by C-G formula based on SCr of 0.58 mg/dL).    No Known Allergies   Antimicrobials this admission: 8/11 Cefepime>> 8/11 Vancomycin>> 8/12 Flagyl>>  Dose adjustments this admission:  Microbiology results 8/12 Wcx staph aureus 8/12 Bcx: NGTD  Thank you for allowing pharmacy to be a part of this patient's care.  10/12, PharmD 06/24/2020 10:31 AM

## 2020-06-24 NOTE — Progress Notes (Signed)
Pharmacy Antibiotic Note  Lori Pollard is a 29 y.o. female admitted on 06/20/2020 with cellulitis. Patient was initiated on broad spectrum antibiotics with vanc/cefepime/flagyl. Wound cultures now with MSSA, few Group C streptococcus, no anaerobes isolated. Pharmacy has been consulted for cefazolin dosing. Patient continues on metronidazole.  Plan: Cefazolin 2 g IV q8h  Per Orthopedic note today, significant purulent drainage from site of previous bedside I&D. Patient to have repeat MRI of knee with possible repeat I&D tomorrow.    Continue to monitor clinical course.   Height: 4\' 11"  (149.9 cm) Weight: 40.3 kg (88 lb 14.4 oz) IBW/kg (Calculated) : 43.2  Temp (24hrs), Avg:99.3 F (37.4 C), Min:98.7 F (37.1 C), Max:99.8 F (37.7 C)  Recent Labs  Lab 06/20/20 1607 06/20/20 2138 06/21/20 0142 06/22/20 0526 06/22/20 0655 06/23/20 0634 06/24/20 0500  WBC 16.8*  --  16.3*  --  9.4 11.7* 11.9*  CREATININE 0.82  --  0.72 0.57  --  0.52 0.58  LATICACIDVEN 2.6* 2.2*  --   --   --   --   --     Estimated Creatinine Clearance: 65.4 mL/min (by C-G formula based on SCr of 0.58 mg/dL).    No Known Allergies   Antimicrobials this admission: 8/11 Cefepime >> 8/15 8/11 Vancomycin >> 8/15 8/12 Flagyl >> 8/15 Cefazolin >>  Dose adjustments this admission:  Microbiology results 8/12 Wcx: MSSA, Group C streptococcus 8/12 Bcx: NGTD  Thank you for allowing pharmacy to be a part of this patient's care.  10/12 06/24/2020 12:31 PM

## 2020-06-24 NOTE — Progress Notes (Signed)
  Subjective:   s/p bedside incision and drainage of right lateral knee abscess. Patient reports pain as moderate does feel that it is improving.  Negative for chest pain and shortness of breath Fever: No recent fevers. Gastrointestinal:Negative for nausea and vomiting Reports significant purulent drainage since dressing was changes yesterday.  Objective: Vital signs in last 24 hours: Temp:  [98.3 F (36.8 C)-99.8 F (37.7 C)] 98.7 F (37.1 C) (08/15 0359) Pulse Rate:  [58-88] 83 (08/15 0359) Resp:  [16-20] 18 (08/15 0359) BP: (109-138)/(70-88) 122/88 (08/15 0359) SpO2:  [95 %-100 %] 100 % (08/15 0359)  Intake/Output from previous day:  Intake/Output Summary (Last 24 hours) at 06/24/2020 0928 Last data filed at 06/24/2020 0518 Gross per 24 hour  Intake 0 ml  Output 800 ml  Net -800 ml    Intake/Output this shift: No intake/output data recorded.  Labs: Recent Labs    06/22/20 0655 06/23/20 0634 06/24/20 0500  HGB 9.7* 9.6* 9.9*   Recent Labs    06/23/20 0634 06/24/20 0500  WBC 11.7* 11.9*  RBC 3.04* 3.16*  3.15*  HCT 27.7* 29.5*  PLT 261 301   Recent Labs    06/23/20 0634 06/24/20 0500  NA 136 138  K 3.1* 3.3*  CL 104 104  CO2 23 24  BUN 7 6  CREATININE 0.52 0.58  GLUCOSE 97 96  CALCIUM 7.9* 8.4*   No results for input(s): LABPT, INR in the last 72 hours.   EXAM General - Patient is Alert, Appropriate and Oriented  Bulky dressing applied to the right knee was removed, significant purulent drainage noted.   Packing was removed from the lateral abscess area and new packing applied. There appears to be a new pus pocket along the anterior lateral aspect of the knee just distal to previous I&D area.  This area was expressed of any purulent material, very small amount present. New bulky dressing applied. Erythema along the anterior aspect of the Quad and around abscess, does not appear to have worsened but not improved. Swelling to the right knee has  improved since yesterday. Patient is able to flex and extend knee with increased pain.  Able to dorsiflex and plantarflex the toes and ankle. Intact to light touch to the right leg. Swelling noted in the right foot, negative Homans.  Past Medical History:  Diagnosis Date  . Cervicalgia   . History of chickenpox   . Hives     Assessment/Plan:    Active Problems:   Cellulitis   Sepsis (HCC)   Chest pain   Polysubstance abuse (HCC)   Malnutrition of moderate degree  Estimated body mass index is 17.96 kg/m as calculated from the following:   Height as of this encounter: 4\' 11"  (1.499 m).   Weight as of this encounter: 40.3 kg.  Pain improving however significant purulent material is still present.  New packing applied. WBC up to 11.9 this morning.  Continue on IV Abx at this time.  Cultures showing staph aureus. Spoke with , MD about concerning amount of purulent drainage. Will repeat a MRI of the knee today to re-evaluate soft tissue. Will make NPO after midnight in case a formal I&D is needed for the right knee.  DVT Prophylaxis - Lovenox Weight-Bearing as tolerated to right leg  J. Signa Kell, PA-C [facility] 06/24/2020, 9:28 AM

## 2020-06-25 ENCOUNTER — Encounter: Payer: Self-pay | Admitting: Family Medicine

## 2020-06-25 ENCOUNTER — Inpatient Hospital Stay: Payer: Medicaid Other | Admitting: Anesthesiology

## 2020-06-25 ENCOUNTER — Encounter
Admission: EM | Disposition: A | Discharge status: Home / Self Care | Payer: Self-pay | Source: Home / Self Care | Attending: Internal Medicine

## 2020-06-25 HISTORY — PX: INCISION AND DRAINAGE OF WOUND: SHX1803

## 2020-06-25 LAB — BASIC METABOLIC PANEL
Anion gap: 10 (ref 5–15)
BUN: 6 mg/dL (ref 6–20)
CO2: 23 mmol/L (ref 22–32)
Calcium: 8.4 mg/dL — ABNORMAL LOW (ref 8.9–10.3)
Chloride: 102 mmol/L (ref 98–111)
Creatinine, Ser: 0.48 mg/dL (ref 0.44–1.00)
GFR calc Af Amer: >60 mL/min (ref >60)
GFR calc non Af Amer: >60 mL/min (ref >60)
Glucose, Bld: 95 mg/dL (ref 70–99)
Potassium: 3.7 mmol/L (ref 3.5–5.1)
Sodium: 135 mmol/L (ref 135–145)

## 2020-06-25 LAB — CULTURE, BLOOD (ROUTINE X 2): Culture: NO GROWTH

## 2020-06-25 LAB — MAGNESIUM: Magnesium: 1.9 mg/dL (ref 1.7–2.4)

## 2020-06-25 SURGERY — IRRIGATION AND DEBRIDEMENT WOUND
Anesthesia: General | Laterality: Right

## 2020-06-25 MED ORDER — CEFAZOLIN SODIUM 1 G IJ SOLR
INTRAMUSCULAR | Status: AC
  Filled 2020-06-25: qty 10

## 2020-06-25 MED ORDER — KETOROLAC TROMETHAMINE 30 MG/ML IJ SOLN
INTRAMUSCULAR | Status: DC | PRN
  Administered 2020-06-25: 30 mg via INTRAVENOUS

## 2020-06-25 MED ORDER — DIPHENHYDRAMINE HCL 50 MG/ML IJ SOLN
INTRAMUSCULAR | Status: AC
  Filled 2020-06-25: qty 1

## 2020-06-25 MED ORDER — HYDROMORPHONE HCL 1 MG/ML IJ SOLN
INTRAMUSCULAR | Status: AC
  Filled 2020-06-25: qty 1

## 2020-06-25 MED ORDER — MIDAZOLAM HCL 2 MG/2ML IJ SOLN
INTRAMUSCULAR | Status: AC
  Filled 2020-06-25: qty 2

## 2020-06-25 MED ORDER — ACETAMINOPHEN 10 MG/ML IV SOLN
INTRAVENOUS | Status: AC
  Filled 2020-06-25: qty 100

## 2020-06-25 MED ORDER — PROPOFOL 10 MG/ML IV BOLUS
INTRAVENOUS | Status: DC | PRN
  Administered 2020-06-25: 100 mg via INTRAVENOUS

## 2020-06-25 MED ORDER — ONDANSETRON HCL 4 MG/2ML IJ SOLN: 4.0000 mg | Freq: Once | INTRAMUSCULAR | Status: DC | PRN

## 2020-06-25 MED ORDER — PROPOFOL 10 MG/ML IV BOLUS
INTRAVENOUS | Status: AC
  Filled 2020-06-25: qty 20

## 2020-06-25 MED ORDER — DIPHENHYDRAMINE HCL 50 MG/ML IJ SOLN
INTRAMUSCULAR | Status: DC | PRN
Start: 2020-06-25 — End: 2020-06-25
  Administered 2020-06-25: 25 mg via INTRAVENOUS

## 2020-06-25 MED ORDER — MIDAZOLAM HCL 2 MG/2ML IJ SOLN
INTRAMUSCULAR | Status: DC | PRN
  Administered 2020-06-25: 2 mg via INTRAVENOUS

## 2020-06-25 MED ORDER — LACTATED RINGERS IV SOLN
INTRAVENOUS | Status: DC | PRN
Start: 2020-06-25 — End: 2020-06-25

## 2020-06-25 MED ORDER — LIDOCAINE HCL (CARDIAC) PF 100 MG/5ML IV SOSY
PREFILLED_SYRINGE | INTRAVENOUS | Status: DC | PRN
  Administered 2020-06-25: 100 mg via INTRAVENOUS

## 2020-06-25 MED ORDER — FENTANYL CITRATE (PF) 250 MCG/5ML IJ SOLN
INTRAMUSCULAR | Status: AC
  Filled 2020-06-25: qty 5

## 2020-06-25 MED ORDER — ENOXAPARIN SODIUM 30 MG/0.3ML ~~LOC~~ SOLN
30.0000 mg | SUBCUTANEOUS | Status: DC
  Administered 2020-06-26 – 2020-06-29 (×4): 30 mg via SUBCUTANEOUS
  Filled 2020-06-25 (×4): qty 0.3

## 2020-06-25 MED ORDER — FENTANYL CITRATE (PF) 100 MCG/2ML IJ SOLN
INTRAMUSCULAR | Status: AC
  Filled 2020-06-25: qty 2

## 2020-06-25 MED ORDER — DEXAMETHASONE SODIUM PHOSPHATE 10 MG/ML IJ SOLN
INTRAMUSCULAR | Status: DC | PRN
  Administered 2020-06-25: 10 mg via INTRAVENOUS

## 2020-06-25 MED ORDER — FENTANYL CITRATE (PF) 100 MCG/2ML IJ SOLN
INTRAMUSCULAR | Status: DC | PRN
  Administered 2020-06-25: 100 ug via INTRAVENOUS
  Administered 2020-06-25: 50 ug via INTRAVENOUS
  Administered 2020-06-25 (×2): 100 ug via INTRAVENOUS

## 2020-06-25 MED ORDER — ONDANSETRON HCL 4 MG/2ML IJ SOLN
INTRAMUSCULAR | Status: DC | PRN
  Administered 2020-06-25: 4 mg via INTRAVENOUS

## 2020-06-25 MED ORDER — CEFAZOLIN SODIUM-DEXTROSE 1-4 GM/50ML-% IV SOLN
INTRAVENOUS | Status: DC | PRN
  Administered 2020-06-25: 1 g via INTRAVENOUS

## 2020-06-25 MED ORDER — SUGAMMADEX SODIUM 200 MG/2ML IV SOLN
INTRAVENOUS | Status: DC | PRN
  Administered 2020-06-25: 200 mg via INTRAVENOUS

## 2020-06-25 MED ORDER — FENTANYL CITRATE (PF) 100 MCG/2ML IJ SOLN
25.0000 ug | INTRAMUSCULAR | Status: AC | PRN
  Administered 2020-06-25 (×8): 25 ug via INTRAVENOUS

## 2020-06-25 MED ORDER — KETOROLAC TROMETHAMINE 30 MG/ML IJ SOLN
INTRAMUSCULAR | Status: AC
  Filled 2020-06-25: qty 1

## 2020-06-25 MED ORDER — ROCURONIUM BROMIDE 100 MG/10ML IV SOLN
INTRAVENOUS | Status: DC | PRN
  Administered 2020-06-25: 40 mg via INTRAVENOUS
  Administered 2020-06-25: 20 mg via INTRAVENOUS

## 2020-06-25 MED ORDER — HYDROMORPHONE HCL 1 MG/ML IJ SOLN
0.2500 mg | INTRAMUSCULAR | Status: DC | PRN
  Administered 2020-06-25 (×4): 0.25 mg via INTRAVENOUS

## 2020-06-25 MED ORDER — PHENYLEPHRINE HCL (PRESSORS) 10 MG/ML IV SOLN
INTRAVENOUS | Status: DC | PRN
  Administered 2020-06-25 (×2): 100 ug via INTRAVENOUS

## 2020-06-25 MED ORDER — ACETAMINOPHEN 10 MG/ML IV SOLN
INTRAVENOUS | Status: DC | PRN
  Administered 2020-06-25: 1000 mg via INTRAVENOUS

## 2020-06-25 SURGICAL SUPPLY — 33 items
BNDG ESMARK 4X12 TAN STRL LF (GAUZE/BANDAGES/DRESSINGS) ×3 IMPLANT
CANISTER SUCT 1200ML W/VALVE (MISCELLANEOUS) ×3 IMPLANT
CANISTER WOUND CARE 500ML ATS (WOUND CARE) ×2 IMPLANT
CHLORAPREP W/TINT 26 (MISCELLANEOUS) ×3 IMPLANT
CORD BIP STRL DISP 12FT (MISCELLANEOUS) ×3 IMPLANT
COVER WAND RF STERILE (DRAPES) ×3 IMPLANT
CUFF TOURN SGL QUICK 18X4 (TOURNIQUET CUFF) IMPLANT
CUFF TOURN SGL QUICK 24 (TOURNIQUET CUFF)
CUFF TRNQT CYL 24X4X16.5-23 (TOURNIQUET CUFF) IMPLANT
DRAIN PENROSE 0.625X18 (DRAIN) ×2 IMPLANT
DRSG VAC ATS MED SENSATRAC (GAUZE/BANDAGES/DRESSINGS) ×2 IMPLANT
FORCEPS JEWEL BIP 4-3/4 STR (INSTRUMENTS) ×3 IMPLANT
GAUZE SPONGE 4X4 12PLY STRL (GAUZE/BANDAGES/DRESSINGS) ×3 IMPLANT
GAUZE XEROFORM 1X8 LF (GAUZE/BANDAGES/DRESSINGS) ×3 IMPLANT
GLOVE BIO SURGEON STRL SZ8 (GLOVE) ×6 IMPLANT
GLOVE INDICATOR 8.0 STRL GRN (GLOVE) ×3 IMPLANT
GOWN STRL REUS W/ TWL LRG LVL3 (GOWN DISPOSABLE) ×1 IMPLANT
GOWN STRL REUS W/ TWL XL LVL3 (GOWN DISPOSABLE) ×1 IMPLANT
GOWN STRL REUS W/TWL LRG LVL3 (GOWN DISPOSABLE) ×2
GOWN STRL REUS W/TWL XL LVL3 (GOWN DISPOSABLE) ×2
HANDPIECE INTERPULSE COAX TIP (DISPOSABLE) ×2
KIT TURNOVER KIT A (KITS) ×3 IMPLANT
MANIFOLD NEPTUNE II (INSTRUMENTS) ×2 IMPLANT
NS IRRIG 500ML POUR BTL (IV SOLUTION) ×3 IMPLANT
PACK EXTREMITY (MISCELLANEOUS) ×3 IMPLANT
PAD CAST CTTN 4X4 STRL (SOFTGOODS) ×1 IMPLANT
PADDING CAST COTTON 4X4 STRL (SOFTGOODS) ×2
SET HNDPC FAN SPRY TIP SCT (DISPOSABLE) IMPLANT
STOCKINETTE 48X4 2 PLY STRL (GAUZE/BANDAGES/DRESSINGS) ×1 IMPLANT
STOCKINETTE IMPERVIOUS 9X36 MD (GAUZE/BANDAGES/DRESSINGS) ×2 IMPLANT
STOCKINETTE STRL 4IN 9604848 (GAUZE/BANDAGES/DRESSINGS) ×3 IMPLANT
STRAP SAFETY 5IN WIDE (MISCELLANEOUS) ×3 IMPLANT
SUT PROLENE 4 0 PS 2 18 (SUTURE) ×3 IMPLANT

## 2020-06-25 NOTE — Anesthesia Postprocedure Evaluation (Signed)
Anesthesia Post Note  Patient: Lori Pollard  Procedure(s) Performed: IRRIGATION AND DEBRIDEMENT WOUND-Right Leg (Right )  Patient location during evaluation: PACU Anesthesia Type: General Level of consciousness: awake and alert and oriented Pain management: pain level controlled Vital Signs Assessment: post-procedure vital signs reviewed and stable Respiratory status: spontaneous breathing Cardiovascular status: blood pressure returned to baseline Anesthetic complications: no   No complications documented.   Last Vitals:  Vitals:   06/25/20 0512 06/25/20 1223  BP: 107/73 109/75  Pulse: 81 90  Resp: 18 17  Temp: 37 C 36.7 C  SpO2: 100% 100%    Last Pain:  Vitals:   06/25/20 1223  TempSrc: Tympanic  PainSc: 10-Worst pain ever                 Wilberto Console

## 2020-06-25 NOTE — Op Note (Signed)
Operative Note    SURGERY DATE: 06/25/2020   PRE-OP DIAGNOSIS:  1. Right leg and distal thigh abscesses   POST-OP DIAGNOSIS:  1. Right leg and distal thigh abscesses   PROCEDURES:  1.  Right distal femur/knee irrigation and debridement 2.  Right proximal leg irrigation and debridement 3.  Right lower extremity application of wound vacuum-assisted closure (VAC) device   SURGEON: Rosealee Albee, MD  ASSISTANT: Haywood Lasso, PA-S   ANESTHESIA: Gen    ESTIMATED BLOOD LOSS: 350cc   TOTAL IV FLUIDS: per anesthesia  DRAIN: none   INDICATION(S):  Lori Pollard is a 29 y.o. female with a history of IV drug use, smoking, bipolar disorder, and malnutrition who attempted to inject Suboxone along the lateral aspect of the knee approximately 2 weeks ago.  She noticed increased pain, erythema, and eventual drainage from the lateral aspect of the knee.  She was admitted to the hospital through the emergency department, and I was consulted regarding an open wound.  An MRI was obtained.  It did not show any definitive abscess, although there was suggestion of a fluid collection about the region of the draining wound.  A bedside I&D was performed on 06/21/2020.  Copious amounts of gross purulence was evacuated and excisional debridement was performed.  The wound continues to drain purulent material.  Repeat MRI showed new abscess pockets about the distal femur and proximal tibia tracking to the medial aspect of the leg. After discussion of risks, benefits, and alternatives to surgery, the patient elected to proceed.    OPERATIVE FINDINGS: Gross purulence with abscess pockets about the distal femur, lateral knee, and proximal leg   OPERATIVE REPORT:   I identified Lori Pollard in the pre-operative holding area. Informed consent was obtained and the surgical site was marked. I reviewed the risks and benefits of the proposed surgical intervention and the patient wished to  proceed. The patient was transferred to the operative suite and general anesthesia was administered. The patient was placed in the supine position. All down side pressure points were appropriately padded.  The packing material in the lateral knee wound was removed, and this was grossly purulent.  The extremity was then prepped and draped in standard fashion. A time out was performed confirming the correct extremity, correct patient, and correct procedure. The patient was already on antibiotics and was due for her next dose but antibiotics were withheld until further intraoperative cultures could be obtained.  A curette was used to probe through the pre-existing lateral knee wound.  There was a tract proximally and anteriorly to the anterolateral aspect of the thigh.  There was a separate tract going distally and anteriorly over the proximal leg as well.  Attention was first turned to the distal thigh abscess located along the anterolateral aspect just proximal to the patella.  An approximately 3 cm incision was made overlying this region.  After incision through the subcutaneous tissue, there was return of significant purulent material.  This was cultured.  Next, an approximately 3 cm incision was made over the anteromedial aspect of the proximal leg just distal to the tibial tubercle.  Similarly, there was return of significant purulent material after going through the subcutaneous tissue.  This was cultured as well.  This wound was then probed along the medial side of the knee and another pocket of pus was encountered.  This was also cultured.  Purulent material was expressed out of all 3 wounds.  A curette was used to  debride the entirety of the abscess tracts about all 3 incisions until all purulent and devitalized tissue had been removed.  A rongeur was used to remove loose fascial tissue about the anteromedial and lateral wounds.  A 15 blade was used to freshen the edges of the lateral knee incision as there  was some necrotic tissue.  All 3 wounds were thoroughly irrigated with normal saline.  The distal thigh incision was surrounded by healthy appearing tissue.  A Penrose drain was placed through the incision into the region of the abscess pocket.  3-0 nylon sutures were placed around the Penrose drain in an interrupted fashion.  The lateral knee wound cannot be closed due to its size (2.5 cm x 2 cm) and unhealthy appearing tissue about the wound.  The anteromedial leg incision (2.5 x 1 cm) was also not closed due to unhealthy appearing tissue about the skin edges.  Therefore, decision was made to place a wound VAC dressing about both of these incisions.  2 separate sponges were placed over both of these incisions and a connector was used to allow for suction on both incisions.  The distal thigh incision was covered with 4 x 4 gauze and Tegaderm.  The patient was then extubated, transferred to a stretcher bed and to the post antesthesia care unit in stable condition.   POSTOPERATIVE PLAN: The patient will continue on IV antibiotics while inpatient.  Wound care team also to be consulted for VAC change on postoperative day #2.  Lovenox for DVT prophylaxis starting tomorrow, and can transition to ASA 325 mg daily from an orthopedic perspective.  The patient can weight-bear as tolerated.  Physical therapy consult for mobilization while inpatient.

## 2020-06-25 NOTE — Progress Notes (Signed)
PROGRESS NOTE    Lori Pollard  YQM:[id number] DOB: 1991-06-25 DOA: 06/20/2020 PCP: Patient, No Pcp Per   Brief Narrative: 29 year old with past medical history significant for narcotic abuse previously on Suboxone, polysubstance abuse, bipolar, PTSD who presented with worsening right knee pain and erythema.  Patient was trying to inject Suboxone film that she got from a friend through IV into her right lateral knee about a week ago.  She developed small induration area in her right knee that is spread to her thigh and leg.  Presented with tachycardia, leukocytosis, lactic acid 2.6.  Potassium of 2.9.    Assessment & Plan:   Active Problems:   Cellulitis   Sepsis (HCC)   Chest pain   Polysubstance abuse (HCC)   Malnutrition of moderate degree  1-Sepsis secondary to cellulitis and abscess right LE  -Patient presents with tachycardia, leukocytosis source for infection of right knee. -MRI negative for septic knee, There is some fluid along the superficial fascia of the gastrocnemius muscle suggestive of fasciitis. Negative for myositis or gas along fascial planes to suggest necrotizing fasciitis. -Underwent I and D at bedside, draining purulent secretion. Follow culture: growing abundant gram-positive cocci  and gram-negative rods. Culture growing few staph aureous. MSSA, and Streptococcus.  -Treated initially with vancomycin, cefepime and Flagyl. Change antibiotics to Ancef and continue with flagyl.   -MRI showed two new areas of abscess.  -8/16; Underwent right distal femur/knee irrigation and debridement. Right leg irrigation and debridement application of wound vac Right LE.  -continue with IV antibiotics.   2-Chest pain: Resolved EKG sinus rhythm troponin negative.  3-Hypokalemia: Replete with 40 Me times 2.   4-Polysubstance abuse: Endorses use of IV heroin.  TOC team has completed consulted  Estimated body mass index is 17.96 kg/m as calculated from the  following:   Height as of this encounter: 4\' 11"  (1.499 m).   Weight as of this encounter: 40.3 kg.   DVT prophylaxis: Lovenox Code Status: Full code Family Communication: Care discussed with patient Disposition Plan:  Status is: Inpatient  Remains inpatient appropriate because:Hemodynamically unstable   Dispo: The patient is from: Home              Anticipated d/c is to: To be determined              Anticipated d/c date is: 3 days              Patient currently is not medically stable to d/c.        Consultants:   Dr Allena KatzPatel   Procedures:   none  Antimicrobials:    Subjective: Pain persist, swelling decreasing. I saw patient prior to surgery   Objective: Vitals:   06/24/20 2122 06/25/20 0512 06/25/20 1223 06/25/20 1417  BP: 113/81 107/73 109/75 111/83  Pulse: (!) 102 81 90 (!) 111  Resp: 20 18 17 16   Temp: 98 F (36.7 C) 98.6 F (37 C) 98.1 F (36.7 C) (!) 97.3 F (36.3 C)  TempSrc: Oral Oral Tympanic   SpO2: 100% 100% 100% 100%  Weight:      Height:        Intake/Output Summary (Last 24 hours) at 06/25/2020 1453 Last data filed at 06/25/2020 1402 Gross per 24 hour  Intake 5468.86 ml  Output 650 ml  Net 4818.86 ml   Filed Weights   06/20/20 1605 06/20/20 2143 06/21/20 0106  Weight: 48 kg 41.7 kg 40.3 kg    Examination:  General exam: NAD Respiratory  system: CTA Cardiovascular system: S 1, S 2 RRR Gastrointestinal system: BS present, soft,nt Central nervous system: Alert Extremities: Right LE with less redness, decreased  Swelling, dressing in place   Data Reviewed: I have personally reviewed following labs and imaging studies  CBC: Recent Labs  Lab 06/20/20 1607 06/21/20 0142 06/22/20 0655 06/23/20 0634 06/24/20 0500  WBC 16.8* 16.3* 9.4 11.7* 11.9*  NEUTROABS 14.3*  --   --   --   --   HGB 11.6* 10.1* 9.7* 9.6* 9.9*  HCT 35.3* 29.4* 29.3* 27.7* 29.5*  MCV 95.9 92.5 95.1 91.1 93.4  PLT 246 217 226 261 301   Basic Metabolic  Panel: Recent Labs  Lab 06/21/20 0142 06/22/20 0526 06/23/20 0634 06/24/20 0500 06/25/20 0457  NA 140 139 136 138 135  K 3.1* 3.5 3.1* 3.3* 3.7  CL 108 108 104 104 102  CO2 25 20* 23 24 23   GLUCOSE 109* 82 97 96 95  BUN 10 7 7 6 6   CREATININE 0.72 0.57 0.52 0.58 0.48  CALCIUM 7.6* 8.1* 7.9* 8.4* 8.4*  MG  --  1.9  --   --  1.9  PHOS  --  2.9  --   --   --    GFR: Estimated Creatinine Clearance: 65.4 mL/min (by C-G formula based on SCr of 0.48 mg/dL). Liver Function Tests: Recent Labs  Lab 06/20/20 1607 06/22/20 0526  AST 26 35  ALT 15 24  ALKPHOS 81 73  BILITOT 0.6 0.6  PROT 6.9 5.6*  ALBUMIN 3.1* 2.3*   No results for input(s): LIPASE, AMYLASE in the last 168 hours. No results for input(s): AMMONIA in the last 168 hours. Coagulation Profile: Recent Labs  Lab 06/21/20 0142  INR 1.2   Cardiac Enzymes: No results for input(s): CKTOTAL, CKMB, CKMBINDEX, TROPONINI in the last 168 hours. BNP (last 3 results) No results for input(s): PROBNP in the last 8760 hours. HbA1C: No results for input(s): HGBA1C in the last 72 hours. CBG: No results for input(s): GLUCAP in the last 168 hours. Lipid Profile: No results for input(s): CHOL, HDL, LDLCALC, TRIG, CHOLHDL, LDLDIRECT in the last 72 hours. Thyroid Function Tests: No results for input(s): TSH, T4TOTAL, FREET4, T3FREE, THYROIDAB in the last 72 hours. Anemia Panel: Recent Labs    06/24/20 0500 06/24/20 1137  VITAMINB12  --  416  FOLATE 10.2  --   FERRITIN 92  --   TIBC 175*  --   IRON 21*  --   RETICCTPCT 0.6  --    Sepsis Labs: Recent Labs  Lab 06/20/20 1607 06/20/20 2138  LATICACIDVEN 2.6* 2.2*    Recent Results (from the past 240 hour(s))  Culture, blood (Routine x 2)     Status: None   Collection Time: 06/20/20  4:07 PM   Specimen: BLOOD  Result Value Ref Range Status   Specimen Description BLOOD BLOOD RIGHT HAND  Final   Special Requests   Final    BOTTLES DRAWN AEROBIC AND ANAEROBIC Blood  Culture results may not be optimal due to an inadequate volume of blood received in culture bottles   Culture   Final    NO GROWTH 5 DAYS Performed at [facility], [address] [place], [address] [place]    Report Status 06/25/2020 FINAL  Final  Culture, blood (Routine x 2)     Status: None (Preliminary result)   Collection Time: 06/20/20  9:39 PM   Specimen: BLOOD  Result Value Ref Range Status  Specimen Description BLOOD BLOOD LEFT FOREARM  Final   Special Requests   Final    BOTTLES DRAWN AEROBIC AND ANAEROBIC Blood Culture adequate volume   Culture   Final    NO GROWTH 4 DAYS Performed at [facility], [address], [place], [place] [address]    Report Status PENDING  Incomplete  SARS Coronavirus 2 by RT PCR (hospital order, performed in [facility] hospital lab) Nasopharyngeal Nasopharyngeal Swab     Status: None   Collection Time: 06/20/20 10:18 PM   Specimen: Nasopharyngeal Swab  Result Value Ref Range Status   SARS Coronavirus 2 NEGATIVE NEGATIVE Final    Comment: (NOTE) SARS-CoV-2 target nucleic acids are NOT DETECTED.  The SARS-CoV-2 RNA is generally detectable in upper and lower respiratory specimens during the acute phase of infection. The lowest concentration of SARS-CoV-2 viral copies this assay can detect is 250 copies / mL. A negative result does not preclude SARS-CoV-2 infection and should not be used as the sole basis for treatment or other patient management decisions.  A negative result may occur with improper specimen collection / handling, submission of specimen other than nasopharyngeal swab, presence of viral mutation(s) within the areas targeted by this assay, and inadequate number of viral copies (<250 copies / mL). A negative result must be combined with clinical observations, patient history, and epidemiological information.  Fact Sheet for Patients:   BoilerBrush.com.cy  Fact Sheet for Healthcare  Providers: https://pope.com/  This test is not yet approved or  cleared by the [place] FDA and has been authorized for detection and/or diagnosis of SARS-CoV-2 by FDA under an Emergency Use Authorization (EUA).  This EUA will remain in effect (meaning this test can be used) for the duration of the COVID-19 declaration under Section 564(b)(1) of the Act, 21 U.S.C. section 360bbb-3(b)(1), unless the authorization is terminated or revoked sooner.  Performed at [facility], [address] [place], [place] [address]   Aerobic/Anaerobic Culture (surgical/deep wound)     Status: None (Preliminary result)   Collection Time: 06/21/20  4:08 PM   Specimen: Wound; Abscess  Result Value Ref Range Status   Specimen Description   Final    WOUND Performed at [facility], [address] [place], [place] [address]    Special Requests NO ANAEROBIC SWAB SENT  Final   Gram Stain   Final    RARE WBC PRESENT, PREDOMINANTLY PMN ABUNDANT GRAM POSITIVE COCCI ABUNDANT GRAM NEGATIVE RODS Performed at [facility], [address], [place], [place] [address]    Culture   Final    FEW STAPHYLOCOCCUS AUREUS FEW STREPTOCOCCUS GROUP C Beta hemolytic streptococci are predictably susceptible to penicillin and other beta lactams. Susceptibility testing not routinely performed. CULTURE REINCUBATED FOR BETTER GROWTH NO ANAEROBES ISOLATED; CULTURE IN PROGRESS FOR 5 DAYS    Report Status PENDING  Incomplete   Organism ID, Bacteria STAPHYLOCOCCUS AUREUS  Final      Susceptibility   Staphylococcus aureus - MIC*    CIPROFLOXACIN <=0.5 SENSITIVE Sensitive     ERYTHROMYCIN <=0.25 SENSITIVE Sensitive     GENTAMICIN <=0.5 SENSITIVE Sensitive     OXACILLIN 0.5 SENSITIVE Sensitive     TETRACYCLINE <=1 SENSITIVE Sensitive     VANCOMYCIN <=0.5 SENSITIVE Sensitive     TRIMETH/SULFA <=10 SENSITIVE Sensitive     CLINDAMYCIN <=0.25 SENSITIVE Sensitive     RIFAMPIN  <=0.5 SENSITIVE Sensitive     Inducible Clindamycin NEGATIVE Sensitive     *  FEW STAPHYLOCOCCUS AUREUS         Radiology Studies: MR KNEE RIGHT WO CONTRAST  Result Date: 06/24/2020 CLINICAL DATA:  Soft tissue infection re-evaluation. EXAM: MRI OF THE RIGHT KNEE WITHOUT CONTRAST TECHNIQUE: Multiplanar, multisequence MR imaging of the knee was performed. No intravenous contrast was administered. COMPARISON:  None. FINDINGS: MENISCI Medial: Intact. Lateral: Intact. LIGAMENTS Cruciates: ACL and PCL are intact. Collaterals: Medial collateral ligament is intact. Lateral collateral ligament complex is intact. CARTILAGE Patellofemoral:  No chondral defect. Medial:  No chondral defect. Lateral:  No chondral defect. JOINT: No joint effusion. Normal Hoffa's fat-pad. No plical thickening. POPLITEAL FOSSA: Popliteus tendon is intact. No Baker's cyst. EXTENSOR MECHANISM: Intact quadriceps tendon. Intact patellar tendon. Intact lateral patellar retinaculum. Intact medial patellar retinaculum. Intact MPFL. BONES: No aggressive osseous lesion. No fracture or dislocation. Other: Large soft tissue wound along the lateral aspect of the knee with overlying bandaging material. Severe soft tissue edema along the anterior half of the knee. Edema in the popliteal fossa and superficial to the medial gastrocnemius muscle has resolved. Small pockets of fluid are present within the subcutaneous fat along the anterior aspect of the proximal lower leg. Small foci of air are seen in the soft tissues along the lateral aspect of the knee adjacent to the cutaneous wound extending superiorly and inferiorly. Muscles are normal. IMPRESSION: 1. Large soft tissue wound along the lateral aspect of the knee with overlying bandaging material. Severe cellulitis along the anterior half of the knee. Small pockets of fluid are present within the subcutaneous fat along the anterior aspect of the proximal lower leg which may reflect more focal areas of  cellulitis versus small abscesses. Soft tissue emphysema along the lateral aspect of the knee adjacent to the cutaneous wound. If there is further clinical concern recommend an MRI of the knee with intravenous contrast. 2. Edema in the popliteal fossa and superficial to the medial gastrocnemius muscle has resolved. Electronically Signed   By: Elige Ko   On: 06/24/2020 15:15        Scheduled Meds: . [MAR Hold] Chlorhexidine Gluconate Cloth  6 each Topical Daily  . [MAR Hold] enoxaparin (LOVENOX) injection  30 mg Subcutaneous Q24H  . [MAR Hold] feeding supplement (ENSURE ENLIVE)  237 mL Oral BID BM  . fentaNYL      . [MAR Hold] ferrous sulfate  325 mg Oral Q breakfast  . [MAR Hold] multivitamin with minerals  1 tablet Oral Daily  . [MAR Hold] nicotine  21 mg Transdermal Daily  . [MAR Hold] senna  1 tablet Oral Daily   Continuous Infusions: . sodium chloride 100 mL/hr at 06/24/20 2233  . [MAR Hold]  ceFAZolin (ANCEF) IV 2 g (06/25/20 0534)  . [MAR Hold] metronidazole 500 mg (06/25/20 0908)     LOS: 5 days    Time spent: 35 minutes.     Alba Cory, MD [facility]   If 7PM-7AM, please contact night-coverage [email or web address]  06/25/2020, 2:53 PM

## 2020-06-25 NOTE — Transfer of Care (Signed)
Immediate Anesthesia Transfer of Care Note  Patient: Lori Pollard  Procedure(s) Performed: IRRIGATION AND DEBRIDEMENT WOUND-Right Leg (Right )  Patient Location: PACU  Anesthesia Type:General  Level of Consciousness: awake, alert  and oriented  Airway & Oxygen Therapy: Patient Spontanous Breathing and Patient connected to nasal cannula oxygen  Post-op Assessment: Report given to RN and Post -op Vital signs reviewed and stable  Post vital signs: Reviewed and stable  Last Vitals:  Vitals Value Taken Time  BP 111/83 06/25/20 1417  Temp    Pulse 80 06/25/20 1420  Resp 19 06/25/20 1420  SpO2 100 % 06/25/20 1420  Vitals shown include unvalidated device data.  Last Pain:  Vitals:   06/25/20 1223  TempSrc: Tympanic  PainSc: 10-Worst pain ever         Complications: No complications documented.

## 2020-06-25 NOTE — Anesthesia Preprocedure Evaluation (Signed)
Anesthesia Evaluation  Patient identified by MRN, date of birth, ID band Patient awake    Reviewed: Allergy & Precautions, NPO status , Patient's Chart, lab work & pertinent test results  Airway Mallampati: II  TM Distance: >3 FB     Dental  (+) Teeth Intact, Chipped   Pulmonary Current Smoker,    Pulmonary exam normal        Cardiovascular negative cardio ROS Normal cardiovascular exam     Neuro/Psych PSYCHIATRIC DISORDERS Anxiety Bipolar Disorder negative neurological ROS     GI/Hepatic negative GI ROS, (+)     substance abuse  cocaine use,   Endo/Other  negative endocrine ROS  Renal/GU negative Renal ROS  negative genitourinary   Musculoskeletal negative musculoskeletal ROS (+)   Abdominal Normal abdominal exam  (+)   Peds negative pediatric ROS (+)  Hematology  (+) anemia ,   Anesthesia Other Findings   Reproductive/Obstetrics negative OB ROS                             Anesthesia Physical  Anesthesia Plan  ASA: II and emergent  Anesthesia Plan: General   Post-op Pain Management:    Induction: Intravenous  PONV Risk Score and Plan:   Airway Management Planned: Oral ETT  Additional Equipment:   Intra-op Plan:   Post-operative Plan: Extubation in OR  Informed Consent: I have reviewed the patients History and Physical, chart, labs and discussed the procedure including the risks, benefits and alternatives for the proposed anesthesia with the patient or authorized representative who has indicated his/her understanding and acceptance.     Dental advisory given  Plan Discussed with: CRNA and Surgeon  Anesthesia Plan Comments:         Anesthesia Quick Evaluation

## 2020-06-25 NOTE — Consult Note (Signed)
WOC Nurse wound follow up Ortho service is following for assessment and plan of care.  Pt is in the OR and had Vac dressings applied to 2 locations on her leg, according to progress notes. WOC will change dressing as requested on Wed. Cammie Mcgee MSN, RN, CWOCN, [place], CNS [phone]

## 2020-06-25 NOTE — Anesthesia Procedure Notes (Signed)
Procedure Name: Intubation Date/Time: 06/25/2020 12:46 PM Performed by: Genevie Ann, CRNA Pre-anesthesia Checklist: Patient identified, Emergency Drugs available, Suction available, Patient being monitored and Timeout performed Patient Re-evaluated:Patient Re-evaluated prior to induction Oxygen Delivery Method: Circle system utilized Preoxygenation: Pre-oxygenation with 100% oxygen Induction Type: IV induction Ventilation: Mask ventilation without difficulty Laryngoscope Size: Mac and 3 Grade View: Grade II Tube type: Oral Tube size: 6.5 mm Number of attempts: 1 Airway Equipment and Method: Stylet Placement Confirmation: ETT inserted through vocal cords under direct vision,  positive ETCO2 and breath sounds checked- equal and bilateral Secured at: 20.5 cm Tube secured with: Tape

## 2020-06-25 NOTE — Consult Note (Addendum)
WOC Nurse Consult Note: WOC consult was requested for possible Vac placement; discussed via secure chat with the ortho team, who are currently following for assessment and plan of care.  Pt is going to the OR today; please re-consult the WOC team after surgery if Vac is applied and assistance is needed with dressing changes. Cammie Mcgee MSN, RN, CWOCN, [place], CNS [phone]

## 2020-06-25 NOTE — H&P (Signed)
Patient is now s/p right knee I&D on 06/21/2020.  She had gross pus evacuated at that time.  Wound was packed.  Cultures grew MSSA and Streptococcus.  She continued to have purulent drainage despite appropriate IV antibiotics.  Repeat MRI was obtained yesterday.  This showed further fluid collections that were not necessarily present at the time of the prior MRI along the anterolateral aspect of the distal femur proximal to the knee joint itself as well as another fluid collection along the anterolateral aspect of the leg distal to the knee joint.  Clinical exam shows significant tenderness and erythema in these regions.  The patient does note that her knee range of motion and pain have significantly improved compared to her function prior to the initial I&D 4 days ago.  Given these findings, we agreed to proceed with repeat I&D of the current site as well as likely irrigation debridement of distal femur and proximal leg fluid collections. After discussion of risks, benefits, and alternatives to surgery, the patient elected to proceed.

## 2020-06-25 NOTE — Progress Notes (Signed)
Called by pt to room, she had removed the dressing " I just had to look" she was pressing area and drainage was coming from pen[place] in place. Redressed and encouraged to try not to remove, reminded NPO after midnight, agrees.

## 2020-06-26 ENCOUNTER — Encounter: Payer: Self-pay | Admitting: Orthopedic Surgery

## 2020-06-26 DIAGNOSIS — L089 Local infection of the skin and subcutaneous tissue, unspecified: Secondary | ICD-10-CM

## 2020-06-26 LAB — BASIC METABOLIC PANEL
Anion gap: 7 (ref 5–15)
BUN: 11 mg/dL (ref 6–20)
CO2: 27 mmol/L (ref 22–32)
Calcium: 7.8 mg/dL — ABNORMAL LOW (ref 8.9–10.3)
Chloride: 105 mmol/L (ref 98–111)
Creatinine, Ser: 0.44 mg/dL (ref 0.44–1.00)
GFR calc Af Amer: >60 mL/min (ref >60)
GFR calc non Af Amer: >60 mL/min (ref >60)
Glucose, Bld: 94 mg/dL (ref 70–99)
Potassium: 3.6 mmol/L (ref 3.5–5.1)
Sodium: 139 mmol/L (ref 135–145)

## 2020-06-26 LAB — AEROBIC/ANAEROBIC CULTURE W GRAM STAIN (SURGICAL/DEEP WOUND)

## 2020-06-26 LAB — CBC
HCT: 21.8 % — ABNORMAL LOW (ref 36.0–46.0)
Hemoglobin: 7.2 g/dL — ABNORMAL LOW (ref 12.0–15.0)
MCH: 31.9 pg (ref 26.0–34.0)
MCHC: 33 g/dL (ref 30.0–36.0)
MCV: 96.5 fL (ref 80.0–100.0)
Platelets: 421 10*3/uL — ABNORMAL HIGH (ref 150–400)
RBC: 2.26 MIL/uL — ABNORMAL LOW (ref 3.87–5.11)
RDW: 13.9 % (ref 11.5–15.5)
WBC: 10.6 10*3/uL — ABNORMAL HIGH (ref 4.0–10.5)
nRBC: 0 % (ref 0.0–0.2)

## 2020-06-26 LAB — CULTURE, BLOOD (ROUTINE X 2)
Culture: NO GROWTH
Special Requests: ADEQUATE

## 2020-06-26 MED ORDER — SODIUM CHLORIDE 0.9 % IV SOLN
2.0000 g | Freq: Three times a day (TID) | INTRAVENOUS | Status: DC
  Administered 2020-06-26: 2 g via INTRAVENOUS
  Filled 2020-06-26 (×2): qty 2

## 2020-06-26 MED ORDER — SODIUM CHLORIDE 0.9 % IV SOLN
3.0000 g | Freq: Four times a day (QID) | INTRAVENOUS | Status: DC
  Administered 2020-06-26 – 2020-06-29 (×12): 3 g via INTRAVENOUS
  Filled 2020-06-26: qty 3
  Filled 2020-06-26 (×3): qty 8
  Filled 2020-06-26: qty 3
  Filled 2020-06-26 (×3): qty 8
  Filled 2020-06-26: qty 3
  Filled 2020-06-26: qty 8
  Filled 2020-06-26: qty 3
  Filled 2020-06-26: qty 8
  Filled 2020-06-26 (×2): qty 3
  Filled 2020-06-26 (×3): qty 8
  Filled 2020-06-26: qty 3

## 2020-06-26 MED ORDER — VANCOMYCIN HCL 750 MG/150ML IV SOLN
750.0000 mg | Freq: Once | INTRAVENOUS | Status: DC
  Administered 2020-06-26: 750 mg via INTRAVENOUS
  Filled 2020-06-26: qty 150

## 2020-06-26 MED ORDER — VANCOMYCIN HCL 500 MG/100ML IV SOLN
500.0000 mg | Freq: Two times a day (BID) | INTRAVENOUS | Status: DC
  Filled 2020-06-26: qty 100

## 2020-06-26 NOTE — Evaluation (Signed)
Physical Therapy Evaluation Patient Details Name: Lori Pollard MRN: [id number] DOB: Oct 11, 1991 Today's Date: 06/26/2020   History of Present Illness  Lori Pollard is a 29 y/o female who was admitted for complaints worsening  R knee pain and erythema. Pt underwent I&D of R knee on 8/16 and a wound vac was placed. 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  Clinical Impression  Pt lying in bed and pleasantly agreeable to PT session. Initially, pt reported 8/10 pain in R knee at rest. Pt able to perform bed mobility with mod I for occasional use of bedrail and for line management. Pt stood tow RW with SBA for safety and ambulated 100 feet using RW with UE support to offset weight on RLE. Pt with reciprocal pattern noted, and ataxic gait resulting in inversion of R foot while remaining on toes requiring CGA for safety and steadying. Pt presents with deficits in ambulation, strength, ROM, and pain. Recommend skilled PT during acute stay for to address current deficits. Anticipate with another session or two of skilled therapy during acute stay, pt will be able to demonstrate safe transfer and ambulation techniques with AD, wound vac line management and should not require PT follow up at discharge. Recommend RW for ambulation due to increased RLE pain.    Follow Up Recommendations No PT follow up;Supervision for mobility/OOB    Equipment Recommendations  Rolling walker with 5" wheels    Recommendations for Other Services       Precautions / Restrictions Precautions Precautions: Fall Restrictions Weight Bearing Restrictions: Yes RLE Weight Bearing: Weight bearing as tolerated      Mobility  Bed Mobility Overal bed mobility: Modified Independent             General bed mobility comments: Mod I for occasional use of bedrail and line management of IV and wound vac  Transfers Overall transfer level: Needs  assistance Equipment used: Rolling walker (2 wheeled) Transfers: Sit to/from Stand Sit to Stand: Supervision         General transfer comment: SBA for sit to stand transfer to RW with no noted LOB or BLE knee buckling  Ambulation/Gait Ambulation/Gait assistance: Min guard Gait Distance (Feet): 100 Feet Assistive device: Rolling walker (2 wheeled) Gait Pattern/deviations: Antalgic;Step-through pattern Gait velocity: decreased   General Gait Details: Pt ambulated 100 feet with heavy BUE usage on RW to offweight RLE. Pt with reciprocal gait pattern and noted to invert R foot secondary to pain. CGA for steadying due to increased pain with ambulation.  Stairs            Wheelchair Mobility    Modified Rankin (Stroke Patients Only)       Balance Overall balance assessment: Needs assistance Sitting-balance support: Feet supported Sitting balance-Leahy Scale: Good Sitting balance - Comments: no LOB with unsupported back sitting EOB   Standing balance support: Bilateral upper extremity supported;During functional activity Standing balance-Leahy Scale: Good Standing balance comment: CGA for standing balance with activity with BUE on RW                             Pertinent Vitals/Pain Pain Assessment: 0-10 Pain Score: 8  Pain Location: R knee prior to mobility Pain Descriptors / Indicators: Discomfort Pain Intervention(s): Monitored during session;Patient requesting pain meds-RN notified;Limited activity within patient's tolerance    Home Living Family/patient expects to be discharged to:: Other (Comment) ("[facility]" - drug rehab  facility) Living Arrangements: Other (Comment) (reports she is homeless currently)               Additional Comments: PT states that she is currently homeless however has been offered a bed at a rehab facility called [facility]" [place]. She reports having 3 kids which are staying with a paternal Aunt currently.     Prior Function Level of Independence: Independent         Comments: Pt reports independence with all mobility prior to admission, denies fall history, and (+) driver.     Hand Dominance   Dominant Hand: Right    Extremity/Trunk Assessment   Upper Extremity Assessment Upper Extremity Assessment: Overall WFL for tasks assessed (grossly 4 to 4+/5 bilaterally)    Lower Extremity Assessment Lower Extremity Assessment: Overall WFL for tasks assessed;RLE deficits/detail (LLE grossly 4 to 4+/5) RLE Deficits / Details: able to move against gravity; grossly 4/5 knee extension, hip flexion, dorsiflexion, limited knee flexion secondary to pain RLE: Unable to fully assess due to pain    Cervical / Trunk Assessment Cervical / Trunk Assessment: Normal  Communication   Communication: No difficulties  Cognition Arousal/Alertness: Awake/alert Behavior During Therapy: WFL for tasks assessed/performed Overall Cognitive Status: Within Functional Limits for tasks assessed                                        General Comments General comments (skin integrity, edema, etc.): RLE erythemic and hot to the touch around knee and lower leg    Exercises     Assessment/Plan    PT Assessment Patient needs continued PT services  PT Problem List Decreased strength;Decreased range of motion;Decreased activity tolerance;Decreased balance;Decreased mobility;Pain;Decreased skin integrity       PT Treatment Interventions DME instruction;Gait training;Stair training;Functional mobility training;Therapeutic activities;Therapeutic exercise;Balance training;Patient/family education    PT Goals (Current goals can be found in the Care Plan section)  Acute Rehab PT Goals Patient Stated Goal: to not hurt PT Goal Formulation: With patient Time For Goal Achievement: 07/10/20 Potential to Achieve Goals: Good    Frequency Min 2X/week   Barriers to discharge        Co-evaluation                AM-PAC PT "6 Clicks" Mobility  Outcome Measure Help needed turning from your back to your side while in a flat bed without using bedrails?: None Help needed moving from lying on your back to sitting on the side of a flat bed without using bedrails?: None Help needed moving to and from a bed to a chair (including a wheelchair)?: A Little Help needed standing up from a chair using your arms (e.g., wheelchair or bedside chair)?: A Little Help needed to walk in hospital room?: A Little Help needed climbing 3-5 steps with a railing? : A Lot 6 Click Score: 19    End of Session Equipment Utilized During Treatment: Gait belt Activity Tolerance: Patient limited by pain Patient left: in bed;with call bell/phone within reach;with bed alarm set Nurse Communication: Mobility status PT Visit Diagnosis: Unsteadiness on feet (R26.81);Other abnormalities of gait and mobility (R26.89);Muscle weakness (generalized) (M62.81);Pain Pain - Right/Left: Right Pain - part of body: Knee    Time: 7026-3785 PT Time Calculation (min) (ACUTE ONLY): 29 min   Charges:   PT Evaluation $PT Eval Moderate Complexity: 1 Mod PT Treatments $Gait  Training: 8-22 mins        Frederich Chick, SPT  Frederich Chick 06/26/2020, 12:13 PM

## 2020-06-26 NOTE — Progress Notes (Addendum)
PROGRESS NOTE    Lori Pollard  ZOX:[id number] DOB: January 13, 1991 DOA: 06/20/2020 PCP: Patient, No Pcp Per   Brief Narrative: 29 year old with past medical history significant for narcotic abuse previously on Suboxone, polysubstance abuse, bipolar, PTSD who presented with worsening right knee pain and erythema.  Patient was trying to inject Suboxone film that she got from a friend through IV into her right lateral knee about a week ago.  She developed small induration area in her right knee that is spread to her thigh and leg.  Presented with tachycardia, leukocytosis, lactic acid 2.6.  Potassium of 2.9.    Assessment & Plan:   Active Problems:   Cellulitis   Sepsis (HCC)   Chest pain   Polysubstance abuse (HCC)   Malnutrition of moderate degree  1-Sepsis secondary to cellulitis and abscess right LE  -Patient presents with tachycardia, leukocytosis source for infection of right knee. -MRI negative for septic knee, There is some fluid along the superficial fascia of the gastrocnemius muscle suggestive of fasciitis. Negative for myositis or gas along fascial planes to suggest necrotizing fasciitis. -Underwent I and D at bedside, draining purulent secretion. Follow culture: growing abundant gram-positive cocci  and gram-negative rods. Culture growing few staph aureous. MSSA, and Streptococcus.  -Treated initially with vancomycin, cefepime and Flagyl. Change antibiotics to Ancef and continue with flagyl 8/15.   -MRI showed two new areas of abscess.  -8/16; Underwent right distal femur/knee irrigation and debridement. Right leg irrigation and debridement application of wound vac Right LE.  -Continue with IV antibiotics. Antibiotics change to Vancomycin and cefepime today, culture from yesterday growing gram negative rods, prior culture staph, streptococcus angionisis and Eikenella Corrodens.   2-Chest pain: Resolved EKG sinus rhythm troponin negative.  3-Hypokalemia:  Resolved  4-Polysubstance abuse: Endorses use of IV heroin.  TOC team has completed consulted 5-Anemia; acute blood loss ; suspect post sx.  Transfuse for Hb less than 7.  n ron supplement.   Estimated body mass index is 17.96 kg/m as calculated from the following:   Height as of this encounter:  (1.499 m).   Weight as of this encounter: 40.3 kg.   DVT prophylaxis: Lovenox Code Status: Full code Family Communication: Care discussed with patient Disposition Plan:  Status is: Inpatient  Remains inpatient appropriate because:Hemodynamically unstable   Dispo: The patient is from: Home              Anticipated d/c is to: To be determined              Anticipated d/c date is: 3 days              Patient currently is not medically stable to d/c.        Consultants:   Dr Allena Katz   Procedures:   none  Antimicrobials:    Subjective: She is still complaining of leg pain, swelling decreasing.  She is positive about stopping using drug.s   Objective: Vitals:   06/26/20 0005 06/26/20 0429 06/26/20 0755 06/26/20 1117  BP: 101/67 (!) 102/57 101/69 102/68  Pulse: 92 79 81 80  Resp:  Temp: 97.9 F (36.6 C) 98.4 F (36.9 C) 98.4 F (36.9 C) 98.5 F (36.9 C)  TempSrc: Oral Oral Oral Oral  SpO2: 100% 100% 100% 100%  Weight:      Height:        Intake/Output Summary (Last 24 hours) at 06/26/2020 1318 Last data filed at 06/26/2020 0500 Gross per 24  hour  Intake 1400 ml  Output 900 ml  Net 500 ml   Filed Weights   06/20/20 1605 06/20/20 2143 06/21/20 0106  Weight: 48 kg 41.7 kg 40.3 kg    Examination:  General exam: NAD Respiratory system: CTA Cardiovascular system: S 1, S 2 RRR Gastrointestinal system: BS present, soft, nt Central nervous system: Alert Extremities: Right LE with wound vac   Data Reviewed: I have personally reviewed following labs and imaging studies  CBC: Recent Labs  Lab 06/20/20 1607 06/20/20 1607 06/21/20 0142  06/22/20 0655 06/23/20 0634 06/24/20 0500 06/26/20 0952  WBC 16.8*   < > 16.3* 9.4 11.7* 11.9* 10.6*  NEUTROABS 14.3*  --   --   --   --   --   --   HGB 11.6*   < > 10.1* 9.7* 9.6* 9.9* 7.2*  HCT 35.3*   < > 29.4* 29.3* 27.7* 29.5* 21.8*  MCV 95.9   < > 92.5 95.1 91.1 93.4 96.5  PLT 246   < > 217 226 261 301 421*   < > = values in this interval not displayed.   Basic Metabolic Panel: Recent Labs  Lab 06/22/20 0526 06/23/20 0634 06/24/20 0500 06/25/20 0457 06/26/20 0952  NA 139 136 138 135 139  K 3.5 3.1* 3.3* 3.7 3.6  CL 108 104 104 102 105  CO2 20* 23 24 23 27   GLUCOSE 82 97 96 95 94  BUN 7 7 6 6 11   CREATININE 0.57 0.52 0.58 0.48 0.44  CALCIUM 8.1* 7.9* 8.4* 8.4* 7.8*  MG 1.9  --   --  1.9  --   PHOS 2.9  --   --   --   --    GFR: Estimated Creatinine Clearance: 65.4 mL/min (by C-G formula based on SCr of 0.44 mg/dL). Liver Function Tests: Recent Labs  Lab 06/20/20 1607 06/22/20 0526  AST 26 35  ALT 15 24  ALKPHOS 81 73  BILITOT 0.6 0.6  PROT 6.9 5.6*  ALBUMIN 3.1* 2.3*   No results for input(s): LIPASE, AMYLASE in the last 168 hours. No results for input(s): AMMONIA in the last 168 hours. Coagulation Profile: Recent Labs  Lab 06/21/20 0142  INR 1.2   Cardiac Enzymes: No results for input(s): CKTOTAL, CKMB, CKMBINDEX, TROPONINI in the last 168 hours. BNP (last 3 results) No results for input(s): PROBNP in the last 8760 hours. HbA1C: No results for input(s): HGBA1C in the last 72 hours. CBG: No results for input(s): GLUCAP in the last 168 hours. Lipid Profile: No results for input(s): CHOL, HDL, LDLCALC, TRIG, CHOLHDL, LDLDIRECT in the last 72 hours. Thyroid Function Tests: No results for input(s): TSH, T4TOTAL, FREET4, T3FREE, THYROIDAB in the last 72 hours. Anemia Panel: Recent Labs    06/24/20 0500 06/24/20 1137  VITAMINB12  --  416  FOLATE 10.2  --   FERRITIN 92  --   TIBC 175*  --   IRON 21*  --   RETICCTPCT 0.6  --    Sepsis  Labs: Recent Labs  Lab 06/20/20 1607 06/20/20 2138  LATICACIDVEN 2.6* 2.2*    Recent Results (from the past 240 hour(s))  Culture, blood (Routine x 2)     Status: None   Collection Time: 06/20/20  4:07 PM   Specimen: BLOOD  Result Value Ref Range Status   Specimen Description BLOOD BLOOD RIGHT HAND  Final   Special Requests   Final    BOTTLES DRAWN AEROBIC AND ANAEROBIC Blood Culture  results may not be optimal due to an inadequate volume of blood received in culture bottles   Culture   Final    NO GROWTH 5 DAYS Performed at [facility], [address] [place], [place] [address]    Report Status 06/25/2020 FINAL  Final  Culture, blood (Routine x 2)     Status: None   Collection Time: 06/20/20  9:39 PM   Specimen: BLOOD  Result Value Ref Range Status   Specimen Description BLOOD BLOOD LEFT FOREARM  Final   Special Requests   Final    BOTTLES DRAWN AEROBIC AND ANAEROBIC Blood Culture adequate volume   Culture   Final    NO GROWTH 5 DAYS Performed at [facility], [address] [place], [place] [address]    Report Status 06/26/2020 FINAL  Final  SARS Coronavirus 2 by RT PCR (hospital order, performed in [facility] hospital lab) Nasopharyngeal Nasopharyngeal Swab     Status: None   Collection Time: 06/20/20 10:18 PM   Specimen: Nasopharyngeal Swab  Result Value Ref Range Status   SARS Coronavirus 2 NEGATIVE NEGATIVE Final    Comment: (NOTE) SARS-CoV-2 target nucleic acids are NOT DETECTED.  The SARS-CoV-2 RNA is generally detectable in upper and lower respiratory specimens during the acute phase of infection. The lowest concentration of SARS-CoV-2 viral copies this assay can detect is 250 copies / mL. A negative result does not preclude SARS-CoV-2 infection and should not be used as the sole basis for treatment or other patient management decisions.  A negative result may occur with improper specimen collection / handling, submission of specimen  other than nasopharyngeal swab, presence of viral mutation(s) within the areas targeted by this assay, and inadequate number of viral copies (<250 copies / mL). A negative result must be combined with clinical observations, patient history, and epidemiological information.  Fact Sheet for Patients:   BoilerBrush.com.cy  Fact Sheet for Healthcare Providers: https://pope.com/  This test is not yet approved or  cleared by the [place] FDA and has been authorized for detection and/or diagnosis of SARS-CoV-2 by FDA under an Emergency Use Authorization (EUA).  This EUA will remain in effect (meaning this test can be used) for the duration of the COVID-19 declaration under Section 564(b)(1) of the Act, 21 U.S.C. section 360bbb-3(b)(1), unless the authorization is terminated or revoked sooner.  Performed at [facility], [address] [place], [place] [address]   Aerobic/Anaerobic Culture (surgical/deep wound)     Status: None   Collection Time: 06/21/20  4:08 PM   Specimen: Wound; Abscess  Result Value Ref Range Status   Specimen Description   Final    WOUND Performed at [facility], [address] [place], [place] [address]    Special Requests NO ANAEROBIC SWAB SENT  Final   Gram Stain   Final    RARE WBC PRESENT, PREDOMINANTLY PMN ABUNDANT GRAM POSITIVE COCCI ABUNDANT GRAM NEGATIVE RODS Performed at [facility], [address], [place], [place] [address]    Culture   Final    FEW STAPHYLOCOCCUS AUREUS FEW STREPTOCOCCUS ANGINOSIS Beta hemolytic streptococci are predictably susceptible to penicillin and other beta lactams. Susceptibility testing not routinely performed. RARE EIKENELLA CORRODENS Usually susceptible to penicillin and other beta lactam agents,quinolones,macrolides and tetracyclines. MIXED ANAEROBIC FLORA PRESENT.  CALL LAB IF FURTHER IID REQUIRED.    Report Status 06/26/2020 FINAL   Final   Organism ID, Bacteria STAPHYLOCOCCUS AUREUS  Final  Susceptibility   Staphylococcus aureus - MIC*    CIPROFLOXACIN <=0.5 SENSITIVE Sensitive     ERYTHROMYCIN <=0.25 SENSITIVE Sensitive     GENTAMICIN <=0.5 SENSITIVE Sensitive     OXACILLIN 0.5 SENSITIVE Sensitive     TETRACYCLINE <=1 SENSITIVE Sensitive     VANCOMYCIN <=0.5 SENSITIVE Sensitive     TRIMETH/SULFA <=10 SENSITIVE Sensitive     CLINDAMYCIN <=0.25 SENSITIVE Sensitive     RIFAMPIN <=0.5 SENSITIVE Sensitive     Inducible Clindamycin NEGATIVE Sensitive     * FEW STAPHYLOCOCCUS AUREUS  Anaerobic culture     Status: None (Preliminary result)   Collection Time: 06/25/20  1:11 PM   Specimen: PATH Other; Tissue  Result Value Ref Range Status   Specimen Description   Final    TISSUE DISTAL FEMUR Performed at [facility], [address] [place], [place] [address]    Special Requests   Final    NONE Performed at [facility], [address] [place], [place] [address]    Gram Stain   Final    NO WBC SEEN RARE Romie MinusGRAM NEGATIVE RODS RARE GRAM POSITIVE COCCI Performed at [facility], [address] [place], [place] [address]    Culture PENDING  Incomplete   Report Status PENDING  Incomplete  Anaerobic culture     Status: None (Preliminary result)   Collection Time: 06/25/20  1:13 PM   Specimen: PATH Other; Tissue  Result Value Ref Range Status   Specimen Description   Final    TISSUE PROXIMAL TIBIA Performed at [facility], [address] [place], [place] [address]    Special Requests   Final    NONE Performed at [facility], [address] [place], [place] [address]    Gram Stain   Final    NO WBC SEEN MODERATE GRAM NEGATIVE RODS FEW GRAM POSITIVE COCCI RARE GRAM POSITIVE RODS Performed at [facility], [address] [place], [place] [address]    Culture PENDING  Incomplete   Report Status PENDING  Incomplete  Anaerobic culture     Status:  None (Preliminary result)   Collection Time: 06/25/20  1:15 PM   Specimen: PATH Other; Tissue  Result Value Ref Range Status   Specimen Description   Final    TISSUE MEDIAL PROXIMAL TIBIA Performed at [facility], [address] [place], [place] [address]    Special Requests   Final    NONE Performed at [facility], [address] [place], [place] [address]    Gram Stain   Final    NO WBC SEEN FEW GRAM NEGATIVE RODS RARE GRAM POSITIVE COCCI Performed at [facility], [address] [place], [place] [address]    Culture PENDING  Incomplete   Report Status PENDING  Incomplete         Radiology Studies: MR KNEE RIGHT WO CONTRAST  Result Date: 06/24/2020 CLINICAL DATA:  Soft tissue infection re-evaluation. EXAM: MRI OF THE RIGHT KNEE WITHOUT CONTRAST TECHNIQUE: Multiplanar, multisequence MR imaging of the knee was performed. No intravenous contrast was administered. COMPARISON:  None. FINDINGS: MENISCI Medial: Intact. Lateral: Intact. LIGAMENTS Cruciates: ACL and PCL are intact. Collaterals: Medial collateral ligament is intact. Lateral collateral ligament complex is intact. CARTILAGE Patellofemoral:  No chondral defect. Medial:  No chondral defect. Lateral:  No chondral defect. JOINT: No joint effusion. Normal Hoffa's fat-pad. No plical thickening. POPLITEAL FOSSA: Popliteus tendon is intact. No Baker's cyst. EXTENSOR  MECHANISM: Intact quadriceps tendon. Intact patellar tendon. Intact lateral patellar retinaculum. Intact medial patellar retinaculum. Intact MPFL. BONES: No aggressive osseous lesion. No fracture or dislocation. Other: Large soft tissue wound along the lateral aspect of the knee with overlying bandaging material. Severe soft tissue edema along the anterior half of the knee. Edema in the popliteal fossa and superficial to the medial gastrocnemius muscle has resolved. Small pockets of fluid are present within the subcutaneous fat along the anterior  aspect of the proximal lower leg. Small foci of air are seen in the soft tissues along the lateral aspect of the knee adjacent to the cutaneous wound extending superiorly and inferiorly. Muscles are normal. IMPRESSION: 1. Large soft tissue wound along the lateral aspect of the knee with overlying bandaging material. Severe cellulitis along the anterior half of the knee. Small pockets of fluid are present within the subcutaneous fat along the anterior aspect of the proximal lower leg which may reflect more focal areas of cellulitis versus small abscesses. Soft tissue emphysema along the lateral aspect of the knee adjacent to the cutaneous wound. If there is further clinical concern recommend an MRI of the knee with intravenous contrast. 2. Edema in the popliteal fossa and superficial to the medial gastrocnemius muscle has resolved. Electronically Signed   By: Elige Ko   On: 06/24/2020 15:15        Scheduled Meds: . Chlorhexidine Gluconate Cloth  6 each Topical Daily  . enoxaparin (LOVENOX) injection  30 mg Subcutaneous Q24H  . feeding supplement (ENSURE ENLIVE)  237 mL Oral BID BM  . ferrous sulfate  325 mg Oral Q breakfast  . multivitamin with minerals  1 tablet Oral Daily  . nicotine  21 mg Transdermal Daily  . senna  1 tablet Oral Daily   Continuous Infusions: . sodium chloride 100 mL/hr at 06/26/20 0139  . ceFEPime (MAXIPIME) IV 2 g (06/26/20 1221)  . metronidazole 500 mg (06/26/20 1104)  . [START ON 06/27/2020] vancomycin    . vancomycin       LOS: 6 days    Time spent: 35 minutes.     Alba Cory, MD [facility]   If 7PM-7AM, please contact night-coverage [email or web address]  06/26/2020, 1:18 PM

## 2020-06-26 NOTE — Progress Notes (Signed)
  Subjective: 1 Day Post-Op Procedure(s) (LRB): IRRIGATION AND DEBRIDEMENT WOUND-Right Leg (Right) Patient reports pain as mild.   Patient is well, and has had no acute complaints or problems Plan is to go Home after hospital stay. Negative for chest pain and shortness of breath Fever: no Gastrointestinal:negative for nausea and vomiting  Objective: Vital signs in last 24 hours: Temp:  [97.2 F (36.2 C)-98.4 F (36.9 C)] 98.4 F (36.9 C) (08/17 0429) Pulse Rate:  [67-111] 79 (08/17 0429) Resp:  [8-18] 16 (08/17 0429) BP: (101-126)/(57-96) 102/57 (08/17 0429) SpO2:  [100 %] 100 % (08/17 0429)  Intake/Output from previous day:  Intake/Output Summary (Last 24 hours) at 06/26/2020 0743 Last data filed at 06/26/2020 0500 Gross per 24 hour  Intake 1400 ml  Output 1050 ml  Net 350 ml    Intake/Output this shift: No intake/output data recorded.  Labs: Recent Labs    06/24/20 0500  HGB 9.9*   Recent Labs    06/24/20 0500  WBC 11.9*  RBC 3.16*  3.15*  HCT 29.5*  PLT 301   Recent Labs    06/24/20 0500 06/25/20 0457  NA 138 135  K 3.3* 3.7  CL 104 102  CO2 24 23  BUN 6 6  CREATININE 0.58 0.48  GLUCOSE 96 95  CALCIUM 8.4* 8.4*   No results for input(s): LABPT, INR in the last 72 hours.   EXAM General - Patient is Alert and Oriented Extremity - Neurovascular intact Sensation intact distally Dorsiflexion/Plantar flexion intact Compartment soft Dressing/Incision - wound vac intact and draining.  Proximal bandage with clear drainage, but controlled.  No erythema. Motor Function - intact, moving foot and toes well on exam. Moving quad and knee gently, but improving.  Past Medical History:  Diagnosis Date  . Cervicalgia   . History of chickenpox   . Hives     Assessment/Plan: 1 Day Post-Op Procedure(s) (LRB): IRRIGATION AND DEBRIDEMENT WOUND-Right Leg (Right) Active Problems:   Cellulitis   Sepsis (HCC)   Chest pain   Polysubstance abuse (HCC)    Malnutrition of moderate degree  Estimated body mass index is 17.96 kg/m as calculated from the following:   Height as of this encounter: 4\' 11"  (1.499 m).   Weight as of this encounter: 40.3 kg. Advance diet Up with therapy  Continue IV Abx Wound care team to change WoundVac as necessary. Change proximal bandage planned for tomorrow.  DVT Prophylaxis - Lovenox Weight-Bearing as tolerated to Right leg  , PA-C Orthopaedic Surgery 06/26/2020, 7:43 AM

## 2020-06-26 NOTE — Care Management (Signed)
TOC assessment complete.  Full note to follow  

## 2020-06-26 NOTE — Consult Note (Signed)
NAME: Lori Pollard  DOB: 1991-05-22  MRN: [id number]  Date/Time: 06/26/2020 1:50 PM  REQUESTING PROVIDER: Dr. Sunnie Nielsenegalado Subjective:  REASON FOR CONSULT: Infection right leg ? Lori Pollard is a 29 y.o. female with a history of IVDA, polysubstance use, bipolar disorder, PTSD presented on 06/21/2019 went to the ED with knee abscess. Patient was trying to shoot up Suboxone into her right lateral knee about a week and half ago and did not hit the vein properly.  This site led to some pain and swelling in duration and it went down her legs.  She she could not walk properly and there was limited flexion to her right knee because of pain.  She did not have any fever.  On arrival in the ED she had a leukocytosis of 16.8.  She was started on vancomycin, cefepime and Flagyl after blood cultures were sent.  Knee x-ray revealed possible gas-forming bacteria. She was evaluated by Dr. Allena KatzPatel the orthopedic surgeon. Dr. Allena KatzPatel did I/D of the right knee abscess on 06/21/2020.  Cultures were sent. And repeat MRI was done on 06/24/2020 Large soft tissue wound along the lateral aspect of the knee with overlying bandaging material. Severe cellulitis along the anterior half of the knee. Small pockets of fluid are present within the subcutaneous fat along the anterior aspect of the proximal lower leg which may reflect more focal areas of cellulitis versus small abscesses. Soft tissue emphysema along the lateral aspect of the knee adjacent to the cutaneous wound. If there is further clinical concern recommend an MRI of the knee with intravenous contrast. 2. Edema in the popliteal fossa and superficial to the medial gastrocnemius muscle She was taken to the OR on 06/24/2020 and the right leg and distal thigh abscess were drained, there was debridement of the abscess and a wound VAC was placed Repeat cultures were sent. The cultures from 06/21/2020 is growing staph aureus, Streptococcus anginosus and  Eikenella purulence.  I am asked to see the patient for the same. Patient gives a history of licking the needle tip with saliva before she mainline.  Past Medical History:  Diagnosis Date  . Cervicalgia   . History of chickenpox   . Hives     Past Surgical History:  Procedure Laterality Date  . CESAREAN SECTION  2013  . CESAREAN SECTION  2015  . CESAREAN SECTION N/A 11/01/2019   Procedure: CESAREAN SECTION;  Surgeon: Natale MilchSchuman, Christanna R, MD;  Location: [facility] ORS;  Service: Obstetrics;  Laterality: N/A;  . INCISION AND DRAINAGE OF WOUND Right 06/25/2020   Procedure: IRRIGATION AND DEBRIDEMENT WOUND-Right Leg;  Surgeon: Signa KellPatel, Sunny, MD;  Location: [facility] ORS;  Service: Orthopedics;  Laterality: Right;    Social History   Socioeconomic History  . Marital status: Single    Spouse name: Not on file  . Number of children: Not on file  . Years of education: Not on file  . Highest education level: Not on file  Occupational History  . Not on file  Tobacco Use  . Smoking status: Current Every Day Smoker    Packs/day: 0.50    Years: 11.00    Pack years: 5.50    Types: Cigarettes    Start date: 09/17/2004  . Smokeless tobacco: Never Used  Vaping Use  . Vaping Use: Never used  Substance and Sexual Activity  . Alcohol use: No    Alcohol/week: 0.0 standard drinks  . Drug use: No  . Sexual activity: Yes    Partners: Male  Birth control/protection: Surgical  Other Topics Concern  . Not on file  Social History Narrative  . Not on file   Social Determinants of Health   Financial Resource Strain:   . Difficulty of Paying Living Expenses:   Food Insecurity:   . Worried About Programme researcher, broadcasting/film/video in the Last Year:   . Barista in the Last Year:   Transportation Needs:   . Freight forwarder (Medical):   Marland Kitchen Lack of Transportation (Non-Medical):   Physical Activity:   . Days of Exercise per Week:   . Minutes of Exercise per Session:   Stress:   . Feeling of Stress :     Social Connections:   . Frequency of Communication with Friends and Family:   . Frequency of Social Gatherings with Friends and Family:   . Attends Religious Services:   . Active Member of Clubs or Organizations:   . Attends Banker Meetings:   Marland Kitchen Marital Status:   Intimate Partner Violence:   . Fear of Current or Ex-Partner:   . Emotionally Abused:   Marland Kitchen Physically Abused:   . Sexually Abused:     Family History  Problem Relation Age of Onset  . Alcohol abuse Mother   . GER disease Mother   . Depression Mother   . Alcohol abuse Father   . Emphysema Father   . Diabetes Maternal Grandmother   . Heart disease Maternal Grandmother   . Hyperlipidemia Maternal Grandmother   . Hypertension Maternal Grandmother    No Known Allergies  ? Current Facility-Administered Medications  Medication Dose Route Frequency Provider Last Rate Last Admin  . 0.9 %  sodium chloride infusion   Intravenous Continuous Signa Kell, MD 100 mL/hr at 06/26/20 0139 New Bag at 06/26/20 0139  . alum & mag hydroxide-simeth (MAALOX/MYLANTA) 200-200-20 MG/5ML suspension 30 mL  30 mL Oral Q4H PRN Signa Kell, MD   30 mL at 06/21/20 0210  . Ampicillin-Sulbactam (UNASYN) 3 g in sodium chloride 0.9 % 100 mL IVPB  3 g Intravenous Q6H Ameriah Lint, Rhodia Albright, MD      . Chlorhexidine Gluconate Cloth 2 % PADS 6 each  6 each Topical Daily Signa Kell, MD   6 each at 06/26/20 [phone]  . diphenhydrAMINE (BENADRYL) injection 50 mg  50 mg Intravenous Q6H PRN Signa Kell, MD   50 mg at 06/21/20 0309  . enoxaparin (LOVENOX) injection 30 mg  30 mg Subcutaneous Q24H Signa Kell, MD   30 mg at 06/26/20 0538  . feeding supplement (ENSURE ENLIVE) (ENSURE ENLIVE) liquid 237 mL  237 mL Oral BID BM Signa Kell, MD   237 mL at 06/26/20 1345  . ferrous sulfate tablet 325 mg  325 mg Oral Q breakfast Signa Kell, MD   325 mg at 06/26/20 0947  . HYDROcodone-acetaminophen (NORCO/VICODIN) 5-325 MG per tablet 1 tablet  1 tablet  Oral Q6H PRN Signa Kell, MD   1 tablet at 06/26/20 1159  . morphine 2 MG/ML injection 1 mg  1 mg Intravenous Q3H PRN Signa Kell, MD   1 mg at 06/26/20 1342  . multivitamin with minerals tablet 1 tablet  1 tablet Oral Daily Signa Kell, MD   1 tablet at 06/26/20 0947  . nicotine (NICODERM CQ - dosed in mg/24 hours) patch 21 mg  21 mg Transdermal Daily Signa Kell, MD   21 mg at 06/26/20 0945  . senna (SENOKOT) tablet 8.6 mg  1 tablet Oral Daily Signa Kell, MD  8.6 mg at 06/26/20 0947  . sodium chloride flush (NS) 0.9 % injection 10-40 mL  10-40 mL Intracatheter PRN Signa Kell, MD         Abtx:  Anti-infectives (From admission, onward)   Start     Dose/Rate Route Frequency Ordered Stop   06/27/20 0000  vancomycin (VANCOREADY) IVPB 500 mg/100 mL  Status:  Discontinued        500 mg 100 mL/hr over 60 Minutes Intravenous Every 12 hours 06/26/20 0948 06/26/20 1349   06/26/20 1400  Ampicillin-Sulbactam (UNASYN) 3 g in sodium chloride 0.9 % 100 mL IVPB     Discontinue     3 g 200 mL/hr over 30 Minutes Intravenous Every 6 hours 06/26/20 1349     06/26/20 1200  vancomycin (VANCOREADY) IVPB 750 mg/150 mL  Status:  Discontinued        750 mg 150 mL/hr over 60 Minutes Intravenous  Once 06/26/20 0941 06/26/20 1349   06/26/20 1100  ceFEPIme (MAXIPIME) 2 g in sodium chloride 0.9 % 100 mL IVPB  Status:  Discontinued        2 g 200 mL/hr over 30 Minutes Intravenous Every 8 hours 06/26/20 0941 06/26/20 1349   06/24/20 1400  ceFAZolin (ANCEF) IVPB 2g/100 mL premix  Status:  Discontinued        2 g 200 mL/hr over 30 Minutes Intravenous Every 8 hours 06/24/20 1240 06/26/20 0925   06/21/20 1400  vancomycin (VANCOREADY) IVPB 500 mg/100 mL  Status:  Discontinued        500 mg 100 mL/hr over 60 Minutes Intravenous Every 12 hours 06/21/20 0557 06/24/20 1227   06/21/20 0200  metroNIDAZOLE (FLAGYL) IVPB 500 mg  Status:  Discontinued        500 mg 100 mL/hr over 60 Minutes Intravenous Every 8 hours  06/21/20 0129 06/26/20 1349   06/20/20 2330  ceFEPIme (MAXIPIME) 2 g in sodium chloride 0.9 % 100 mL IVPB  Status:  Discontinued        2 g 200 mL/hr over 30 Minutes Intravenous Every 8 hours 06/20/20 2324 06/24/20 1232   06/20/20 2200  vancomycin (VANCOCIN) IVPB 1000 mg/200 mL premix        1,000 mg 200 mL/hr over 60 Minutes Intravenous  Once 06/20/20 2151 06/21/20 1044      REVIEW OF SYSTEMS:  Const: negative fever, negative chills, negative weight loss Eyes: negative diplopia or visual changes, negative eye pain ENT: negative coryza, negative sore throat Resp: negative cough, hemoptysis, dyspnea Cards: negative for chest pain, palpitations, lower extremity edema GU: negative for frequency, dysuria and hematuria GI: Negative for abdominal pain, diarrhea, bleeding, constipation Skin: As above Heme: negative for easy bruising and gum/nose bleeding MS: Pain right leg Neurolo:negative for headaches, dizziness, vertigo, memory problems  Psych: History of PTSD and bipolar Endocrine: negative for thyroid, diabetes Allergy/Immunology- negative for any medication or food allergies ?  Objective:  VITALS:  BP 102/68 (BP Location: Right Arm)   Pulse 80   Temp 98.5 F (36.9 C) (Oral)   Resp 16   Ht  (1.499 m)   Wt 40.3 kg   SpO2 100%   BMI 17.96 kg/m  PHYSICAL EXAM:  General: Alert, cooperative, no distress, appears stated age.  Head: Normocephalic, without obvious abnormality, atraumatic. Eyes: Conjunctivae clear, anicteric sclerae. Pupils are equal ENT Nares normal. No drainage or sinus tenderness. Lips, mucosa, and tongue normal. No Thrush Neck: Supple, symmetrical, no adenopathy, thyroid: non tender no carotid bruit and  no JVD. Back: No CVA tenderness. Lungs: Clear to auscultation bilaterally. No Wheezing or Rhonchi. No rales. Heart: s1s2 Abdomen: Soft, non-tender,not distended. Bowel sounds normal. No masses Extremities: Pain right leg and limited flexion of the  right knee joint Right lateral aspect of the thigh and leg covered with wound VAC  skin: As above Lymph: Cervical, supraclavicular normal. Neurologic: Grossly non-focal Pertinent Labs Lab Results CBC    Component Value Date/Time   WBC 10.6 (H) 06/26/2020 0952   RBC 2.26 (L) 06/26/2020 0952   HGB 7.2 (L) 06/26/2020 0952   HGB 11.4 09/27/2019 1018   HCT 21.8 (L) 06/26/2020 0952   HCT 33.1 (L) 09/27/2019 1018   PLT 421 (H) 06/26/2020 0952   PLT 240 09/27/2019 1018   MCV 96.5 06/26/2020 0952   MCV 93 09/27/2019 1018   MCV 94 05/26/2014 0125   MCH 31.9 06/26/2020 0952   MCHC 33.0 06/26/2020 0952   RDW 13.9 06/26/2020 0952   RDW 12.5 09/27/2019 1018   RDW 13.6 05/26/2014 0125   LYMPHSABS 1.5 06/20/2020 1607   LYMPHSABS 1.4 09/27/2019 1018   LYMPHSABS 2.0 05/26/2014 0125   MONOABS 0.7 06/20/2020 1607   MONOABS 0.8 05/26/2014 0125   EOSABS 0.2 06/20/2020 1607   EOSABS 0.1 09/27/2019 1018   EOSABS 0.1 05/26/2014 0125   BASOSABS 0.1 06/20/2020 1607   BASOSABS 0.0 09/27/2019 1018   BASOSABS 0.0 05/26/2014 0125    CMP Latest Ref Rng & Units 06/26/2020 06/25/2020 06/24/2020  Glucose 70 - 99 mg/dL 94 95 96  BUN 6 - 20 mg/dL 11 6 6   Creatinine 0.44 - 1.00 mg/dL 6.33 3.54  Sodium 135 - 145 mmol/L 139 135 138  Potassium 3.5 - 5.1 mmol/L 3.6 3.7 3.3(L)  Chloride 98 - 111 mmol/L 105 102 104  CO2 22 - 32 mmol/L 27 23 24   Calcium 8.9 - 10.3 mg/dL 7.8(L) 8.4(L) 8.4(L)  Total Protein 6.5 - 8.1 g/dL - - -  Total Bilirubin 0.3 - 1.2 mg/dL - - -  Alkaline Phos 38 - 126 U/L - - -  AST 15 - 41 U/L - - -  ALT 0 - 44 U/L - - -      Microbiology: Recent Results (from the past 240 hour(s))  Culture, blood (Routine x 2)     Status: None   Collection Time: 06/20/20  4:07 PM   Specimen: BLOOD  Result Value Ref Range Status   Specimen Description BLOOD BLOOD RIGHT HAND  Final   Special Requests   Final    BOTTLES DRAWN AEROBIC AND ANAEROBIC Blood Culture results may not be optimal due  to an inadequate volume of blood received in culture bottles   Culture   Final    NO GROWTH 5 DAYS Performed at [facility], [address], [place], [address] [place]    Report Status 06/25/2020 FINAL  Final  Culture, blood (Routine x 2)     Status: None   Collection Time: 06/20/20  9:39 PM   Specimen: BLOOD  Result Value Ref Range Status   Specimen Description BLOOD BLOOD LEFT FOREARM  Final   Special Requests   Final    BOTTLES DRAWN AEROBIC AND ANAEROBIC Blood Culture adequate volume   Culture   Final    NO GROWTH 5 DAYS Performed at [facility], [address] [place], [address] [place]    Report Status 06/26/2020 FINAL  Final  SARS Coronavirus 2 by RT PCR (hospital order, performed in  [facility] hospital lab) Nasopharyngeal Nasopharyngeal Swab     Status: None   Collection Time: 06/20/20 10:18 PM   Specimen: Nasopharyngeal Swab  Result Value Ref Range Status   SARS Coronavirus 2 NEGATIVE NEGATIVE Final    Comment: (NOTE) SARS-CoV-2 target nucleic acids are NOT DETECTED.  The SARS-CoV-2 RNA is generally detectable in upper and lower respiratory specimens during the acute phase of infection. The lowest concentration of SARS-CoV-2 viral copies this assay can detect is 250 copies / mL. A negative result does not preclude SARS-CoV-2 infection and should not be used as the sole basis for treatment or other patient management decisions.  A negative result may occur with improper specimen collection / handling, submission of specimen other than nasopharyngeal swab, presence of viral mutation(s) within the areas targeted by this assay, and inadequate number of viral copies (<250 copies / mL). A negative result must be combined with clinical observations, patient history, and epidemiological information.  Fact Sheet for Patients:   BoilerBrush.com.cy  Fact Sheet for Healthcare  Providers: https://pope.com/  This test is not yet approved or  cleared by the [place] FDA and has been authorized for detection and/or diagnosis of SARS-CoV-2 by FDA under an Emergency Use Authorization (EUA).  This EUA will remain in effect (meaning this test can be used) for the duration of the COVID-19 declaration under Section 564(b)(1) of the Act, 21 U.S.C. section 360bbb-3(b)(1), unless the authorization is terminated or revoked sooner.  Performed at [facility], [address], [place], [place] [address]   Aerobic/Anaerobic Culture (surgical/deep wound)     Status: None   Collection Time: 06/21/20  4:08 PM   Specimen: Wound; Abscess  Result Value Ref Range Status   Specimen Description   Final    WOUND Performed at [facility], [address] [place], [place] [address]    Special Requests NO ANAEROBIC SWAB SENT  Final   Gram Stain   Final    RARE WBC PRESENT, PREDOMINANTLY PMN ABUNDANT GRAM POSITIVE COCCI ABUNDANT GRAM NEGATIVE RODS Performed at [facility], [address] [place], [place] [address]    Culture   Final    FEW STAPHYLOCOCCUS AUREUS FEW STREPTOCOCCUS ANGINOSIS Beta hemolytic streptococci are predictably susceptible to penicillin and other beta lactams. Susceptibility testing not routinely performed. RARE EIKENELLA CORRODENS Usually susceptible to penicillin and other beta lactam agents,quinolones,macrolides and tetracyclines. MIXED ANAEROBIC FLORA PRESENT.  CALL LAB IF FURTHER IID REQUIRED.    Report Status 06/26/2020 FINAL  Final   Organism ID, Bacteria STAPHYLOCOCCUS AUREUS  Final      Susceptibility   Staphylococcus aureus - MIC*    CIPROFLOXACIN <=0.5 SENSITIVE Sensitive     ERYTHROMYCIN <=0.25 SENSITIVE Sensitive     GENTAMICIN <=0.5 SENSITIVE Sensitive     OXACILLIN 0.5 SENSITIVE Sensitive     TETRACYCLINE <=1 SENSITIVE Sensitive     VANCOMYCIN <=0.5 SENSITIVE Sensitive      TRIMETH/SULFA <=10 SENSITIVE Sensitive     CLINDAMYCIN <=0.25 SENSITIVE Sensitive     RIFAMPIN <=0.5 SENSITIVE Sensitive     Inducible Clindamycin NEGATIVE Sensitive     * FEW STAPHYLOCOCCUS AUREUS  Anaerobic culture     Status: None (Preliminary result)   Collection Time: 06/25/20  1:11 PM   Specimen: PATH Other; Tissue  Result Value Ref Range Status   Specimen Description   Final    TISSUE DISTAL FEMUR Performed at [facility], [address], [place], [place] [address]  Special Requests   Final    NONE Performed at [facility], [address] [place], [place] [address]    Gram Stain   Final    NO WBC SEEN RARE GRAM NEGATIVE RODS RARE GRAM POSITIVE COCCI Performed at [facility], [address], [place], [place] [address]    Culture PENDING  Incomplete   Report Status PENDING  Incomplete  Anaerobic culture     Status: None (Preliminary result)   Collection Time: 06/25/20  1:13 PM   Specimen: PATH Other; Tissue  Result Value Ref Range Status   Specimen Description   Final    TISSUE PROXIMAL TIBIA Performed at [facility], [address] [place], [place] [address]    Special Requests   Final    NONE Performed at [facility], [address] [place], [place] [address]    Gram Stain   Final    NO WBC SEEN MODERATE GRAM NEGATIVE RODS FEW GRAM POSITIVE COCCI RARE GRAM POSITIVE RODS Performed at [facility], [address] [place], [place] [address]    Culture PENDING  Incomplete   Report Status PENDING  Incomplete  Anaerobic culture     Status: None (Preliminary result)   Collection Time: 06/25/20  1:15 PM   Specimen: PATH Other; Tissue  Result Value Ref Range Status   Specimen Description   Final    TISSUE MEDIAL PROXIMAL TIBIA Performed at [facility], [address], [place], [place] [address]    Special Requests   Final    NONE Performed at [facility], [address]  [place], [place] [address]    Gram Stain   Final    NO WBC SEEN FEW GRAM NEGATIVE RODS RARE GRAM POSITIVE COCCI Performed at [facility], [address], [place], [place] [address]    Culture PENDING  Incomplete   Report Status PENDING  Incomplete    IMAGING RESULTS: MRI reviewed  I have personally reviewed the films ? Impression/Recommendation ?Complicated soft infection of the right leg in a patient with intravenous drug use. The wound has been debrided and cultures have shows MSSA, strep, Eikenella corrodens. Discontinue cefepime, vancomycin and Flagyl and change to Unasyn. We will evaluate the wound when the wound VAC is being changed. We will check HIV and hepatitis C. Discussed the management with the patient.  ? ? __ Note:  This document was prepared using Dragon voice recognition software and may include unintentional dictation errors.

## 2020-06-27 DIAGNOSIS — E440 Moderate protein-calorie malnutrition: Secondary | ICD-10-CM

## 2020-06-27 LAB — HIV ANTIBODY (ROUTINE TESTING W REFLEX): HIV Screen 4th Generation wRfx: NONREACTIVE

## 2020-06-27 LAB — HEPATITIS C ANTIBODY: HCV Ab: NONREACTIVE

## 2020-06-27 MED ORDER — MELATONIN 5 MG PO TABS
2.5000 mg | ORAL_TABLET | Freq: Every day | ORAL | Status: DC
  Administered 2020-06-27: 2.5 mg via ORAL
  Filled 2020-06-27: qty 1

## 2020-06-27 MED ORDER — ASCORBIC ACID 500 MG PO TABS
250.0000 mg | ORAL_TABLET | Freq: Two times a day (BID) | ORAL | Status: DC
  Administered 2020-06-27 – 2020-06-29 (×4): 250 mg via ORAL
  Filled 2020-06-27 (×4): qty 1

## 2020-06-27 NOTE — Progress Notes (Signed)
Suspicion of smoking due to foul odor in room.  When asked, patient reports no materials, however, she handed lighter and apologized.  RN informed Supervisor and NP, Manuela Schwartz.

## 2020-06-27 NOTE — Progress Notes (Signed)
  Subjective: 2 Days Post-Op Procedure(s) (LRB): IRRIGATION AND DEBRIDEMENT WOUND-Right Leg (Right) Patient reports pain as moderate.   Patient is well, and has had no acute complaints or problems Plan is to go Home after hospital stay. Negative for chest pain and shortness of breath Fever: no Gastrointestinal:negative for nausea and vomiting  Objective: Vital signs in last 24 hours: Temp:  [98.4 F (36.9 C)-98.9 F (37.2 C)] 98.5 F (36.9 C) (08/18 0406) Pulse Rate:  [80-83] 83 (08/18 0406) Resp:  [16-20] 20 (08/18 0406) BP: (101-113)/(63-72) 108/72 (08/18 0406) SpO2:  [100 %] 100 % (08/18 0406)  Intake/Output from previous day:  Intake/Output Summary (Last 24 hours) at 06/27/2020 0619 Last data filed at 06/27/2020 3888 Gross per 24 hour  Intake 3732.67 ml  Output 950 ml  Net 2782.67 ml    Intake/Output this shift: Total I/O In: 0  Out: 950 [Urine:950]  Labs: Recent Labs    06/26/20 0952  HGB 7.2*   Recent Labs    06/26/20 0952  WBC 10.6*  RBC 2.26*  HCT 21.8*  PLT 421*   Recent Labs    06/25/20 0457 06/26/20 0952  NA 135 139  K 3.7 3.6  CL 102 105  CO2 23 27  BUN 6 11  CREATININE 0.48 0.44  GLUCOSE 95 94  CALCIUM 8.4* 7.8*   No results for input(s): LABPT, INR in the last 72 hours.   EXAM General - Patient is Alert and Oriented Extremity - Neurovascular intact Sensation intact distally Dorsiflexion/Plantar flexion intact Compartment soft Dressing/Incision - wound vac intact and draining.  Proximal bandage with clear drainage,and removed.  New Dry bandage applied.  No increasing erythema. Motor Function - intact, moving foot and toes well on exam. Moving quad and knee gently, but improving.  Past Medical History:  Diagnosis Date  . Cervicalgia   . History of chickenpox   . Hives     Assessment/Plan: 2 Days Post-Op Procedure(s) (LRB): IRRIGATION AND DEBRIDEMENT WOUND-Right Leg (Right) Active Problems:   Cellulitis   Sepsis (HCC)    Chest pain   Polysubstance abuse (HCC)   Malnutrition of moderate degree  Estimated body mass index is 17.96 kg/m as calculated from the following:   Height as of this encounter: 4\' 11"  (1.499 m).   Weight as of this encounter: 40.3 kg. Advance diet Up with therapy  Continue IV Abx.  Changed to Unasyn.  Infectious Disease consult appreciated. Wound care team to change WoundVac today. Post op Anemia with Hgb of 7.2.  Transfuse if drops below 7.0, according to Medicine.    DVT Prophylaxis - Lovenox Weight-Bearing as tolerated to Right leg  , PA-C Orthopaedic Surgery 06/27/2020, 6:19 AM

## 2020-06-27 NOTE — Progress Notes (Signed)
PROGRESS NOTE    Lori Pollard  LKJ:[id number] DOB: [birth year] DOA: 06/20/2020 PCP: Patient, No Pcp Per   Brief Narrative: 29 year old with past medical history significant for narcotic abuse previously on Suboxone, polysubstance abuse, bipolar, PTSD who presented with worsening right knee pain and erythema.  Patient was trying to inject Suboxone film that she got from a friend through IV into her right lateral knee about a week ago.  She developed small induration area in her right knee that is spread to her thigh and leg.  Presented with tachycardia, leukocytosis, lactic acid 2.6.  Potassium of 2.9.    Subjective: The patient was seen and examined this morning, remained stable. No major issues overnight Still complaining of leg pain, swelling edema erythema has much improved        Assessment & Plan:   Active Problems:   Cellulitis   Sepsis ([place])   Chest pain   Polysubstance abuse (HCC)   Malnutrition of moderate degree  Sepsis secondary to cellulitis and abscess right LE  -Patient was seen and examined, remains hemodynamically stable  -On admission patient met the criteria for sepsis with tachycardia, leukocytosis -source of infection right knee infection -MRI negative for septic knee, There is some fluid along the superficial fascia of the gastrocnemius muscle suggestive of fasciitis. Negative for myositis or gas along fascial planes to suggest necrotizing fasciitis. -Underwent I and D at bedside, draining purulent secretion. Follow culture: growing abundant gram-positive cocci  and gram-negative rods. Culture growing few staph aureous. MSSA, and Streptococcus.  -Treated initially with vancomycin, cefepime and Flagyl.  Change antibiotics to Ancef and continue with flagyl 8/15. -MRI showed two new areas of abscess.  -8/16; Underwent right distal femur/knee irrigation and debridement. Right leg irrigation and debridement application of wound vac Right LE.    -Continue with IV antibiotics. Antibiotics change to Vancomycin and cefepime 8/17 >>   - culture from 8/16 few staph and sterp., prior culture staph, streptococcus angionisis and Eikenella Corrodens.  (Pansensitive)  Chest pain: Resolved EKG sinus rhythm troponin negative.  Hypokalemia: Resolved  Polysubstance abuse: Endorses use of IV heroin.  TOC team has completed consulted  Anemia; acute blood loss ; suspect post sx.  Transfuse for Hb less than 7.  n ron supplement.   Estimated body mass index is 17.96 kg/m as calculated from the following:   Height as of this encounter: '4\' 11"'  (1.499 m).   Weight as of this encounter: 40.3 kg.   DVT prophylaxis: Lovenox Code Status: Full code Family Communication: Care discussed with patient Disposition Plan:  Status is: Inpatient  Remains inpatient appropriate because:Hemodynamically unstable   Dispo: The patient is from: Home              Anticipated d/c is to: To be determined              Anticipated d/c date is: 3 days              Patient currently is not medically stable to d/c.        Consultants:   Dr Posey Pronto   Procedures:   none  Antimicrobials:    Objective: Vitals:   06/26/20 1117 06/26/20 1948 06/27/20 0406 06/27/20 0833  BP: 102/68 113/63 108/72 103/71  Pulse: 80 81 83 81  Resp: '16 20 20 18  ' Temp: 98.5 F (36.9 C) 98.9 F (37.2 C) 98.5 F (36.9 C) 98.3 F (36.8 C)  TempSrc: Oral Oral Oral Oral  SpO2: 100% 100% 100%  100%  Weight:      Height:        Intake/Output Summary (Last 24 hours) at 06/27/2020 1001 Last data filed at 06/27/2020 0611 Gross per 24 hour  Intake 3732.67 ml  Output 950 ml  Net 2782.67 ml   Filed Weights   06/20/20 1605 06/20/20 2143 06/21/20 0106  Weight: 48 kg 41.7 kg 40.3 kg    Examination:    Physical Exam:   General:  Alert, oriented, cooperative, no distress;   HEENT:  Normocephalic, PERRL, otherwise with in Normal limits   Neuro:  CNII-XII intact. ,  normal motor and sensation, reflexes intact   Lungs:   Clear to auscultation BL, Respirations unlabored, no wheezes / crackles  Cardio:    S1/S2, RRR, No murmure, No Rubs or Gallops   Abdomen:   Soft, non-tender, bowel sounds active all four quadrants,  no guarding or peritoneal signs.  Muscular skeletal:  Limited exam - in bed, able to move all 4 extremities, Normal strength,  2+ pulses,  symmetric, No pitting edema  Skin:  Dry, warm to touch, negative for any Rashes, Right LE with wound vac  Wounds: Right LE with wound with vac.Marland KitchenMarland KitchenPlease see nursing documentation           Data Reviewed: I have personally reviewed following labs and imaging studies  CBC: Recent Labs  Lab 06/20/20 1607 06/20/20 1607 06/21/20 0142 06/22/20 0655 06/23/20 0634 06/24/20 0500 06/26/20 0952  WBC 16.8*   < > 16.3* 9.4 11.7* 11.9* 10.6*  NEUTROABS 14.3*  --   --   --   --   --   --   HGB 11.6*   < > 10.1* 9.7* 9.6* 9.9* 7.2*  HCT 35.3*   < > 29.4* 29.3* 27.7* 29.5* 21.8*  MCV 95.9   < > 92.5 95.1 91.1 93.4 96.5  PLT 246   < > 217 226 261 301 421*   < > = values in this interval not displayed.   Basic Metabolic Panel: Recent Labs  Lab 06/22/20 0526 06/23/20 0634 06/24/20 0500 06/25/20 0457 06/26/20 0952  NA 139 136 138 135 139  K 3.5 3.1* 3.3* 3.7 3.6  CL 108 104 104 102 105  CO2 20* '23 24 23 27  ' GLUCOSE 82 97 96 95 94  BUN '7 7 6 6 11  ' CREATININE 0.57 0.52 0.58 0.48 0.44  CALCIUM 8.1* 7.9* 8.4* 8.4* 7.8*  MG 1.9  --   --  1.9  --   PHOS 2.9  --   --   --   --    GFR: Estimated Creatinine Clearance: 65.4 mL/min (by C-G formula based on SCr of 0.44 mg/dL). Liver Function Tests: Recent Labs  Lab 06/20/20 1607 06/22/20 0526  AST 26 35  ALT 15 24  ALKPHOS 81 73  BILITOT 0.6 0.6  PROT 6.9 5.6*  ALBUMIN 3.1* 2.3*   No results for input(s): LIPASE, AMYLASE in the last 168 hours. No results for input(s): AMMONIA in the last 168 hours. Coagulation Profile: Recent Labs  Lab  06/21/20 0142  INR 1.2   Cardiac Enzymes: No results for input(s): CKTOTAL, CKMB, CKMBINDEX, TROPONINI in the last 168 hours. BNP (last 3 results) No results for input(s): PROBNP in the last 8760 hours. HbA1C: No results for input(s): HGBA1C in the last 72 hours. CBG: No results for input(s): GLUCAP in the last 168 hours. Lipid Profile: No results for input(s): CHOL, HDL, LDLCALC, TRIG, CHOLHDL, LDLDIRECT in the last 72  hours. Thyroid Function Tests: No results for input(s): TSH, T4TOTAL, FREET4, T3FREE, THYROIDAB in the last 72 hours. Anemia Panel: Recent Labs    06/24/20 1137  VITAMINB12 416   Sepsis Labs: Recent Labs  Lab 06/20/20 1607 06/20/20 2138  LATICACIDVEN 2.6* 2.2*    Recent Results (from the past 240 hour(s))  Culture, blood (Routine x 2)     Status: None   Collection Time: 06/20/20  4:07 PM   Specimen: BLOOD  Result Value Ref Range Status   Specimen Description BLOOD BLOOD RIGHT HAND  Final   Special Requests   Final    BOTTLES DRAWN AEROBIC AND ANAEROBIC Blood Culture results may not be optimal due to an inadequate volume of blood received in culture bottles   Culture   Final    NO GROWTH 5 DAYS Performed at [facility], [address] [place], [place] [address]    Report Status 06/25/2020 FINAL  Final  Culture, blood (Routine x 2)     Status: None   Collection Time: 06/20/20  9:39 PM   Specimen: BLOOD  Result Value Ref Range Status   Specimen Description BLOOD BLOOD LEFT FOREARM  Final   Special Requests   Final    BOTTLES DRAWN AEROBIC AND ANAEROBIC Blood Culture adequate volume   Culture   Final    NO GROWTH 5 DAYS Performed at [facility], [address], [place], [place] [address]    Report Status 06/26/2020 FINAL  Final  SARS Coronavirus 2 by RT PCR (hospital order, performed in [facility] hospital lab) Nasopharyngeal Nasopharyngeal Swab     Status: None   Collection Time: 06/20/20 10:18 PM   Specimen: Nasopharyngeal  Swab  Result Value Ref Range Status   SARS Coronavirus 2 NEGATIVE NEGATIVE Final    Comment: (NOTE) SARS-CoV-2 target nucleic acids are NOT DETECTED.  The SARS-CoV-2 RNA is generally detectable in upper and lower respiratory specimens during the acute phase of infection. The lowest concentration of SARS-CoV-2 viral copies this assay can detect is 250 copies / mL. A negative result does not preclude SARS-CoV-2 infection and should not be used as the sole basis for treatment or other patient management decisions.  A negative result may occur with improper specimen collection / handling, submission of specimen other than nasopharyngeal swab, presence of viral mutation(s) within the areas targeted by this assay, and inadequate number of viral copies (<250 copies / mL). A negative result must be combined with clinical observations, patient history, and epidemiological information.  Fact Sheet for Patients:   StrictlyIdeas.no  Fact Sheet for Healthcare Providers: BankingDealers.co.za  This test is not yet approved or  cleared by the [place] FDA and has been authorized for detection and/or diagnosis of SARS-CoV-2 by FDA under an Emergency Use Authorization (EUA).  This EUA will remain in effect (meaning this test can be used) for the duration of the COVID-19 declaration under Section 564(b)(1) of the Act, 21 U.S.C. section 360bbb-3(b)(1), unless the authorization is terminated or revoked sooner.  Performed at [facility], [address] [place], [place] [address]   Aerobic/Anaerobic Culture (surgical/deep wound)     Status: None   Collection Time: 06/21/20  4:08 PM   Specimen: Wound; Abscess  Result Value Ref Range Status   Specimen Description   Final    WOUND Performed at [facility], [address], [place], [place] [address]    Special Requests NO ANAEROBIC SWAB SENT  Final   Gram Stain  Final     RARE WBC PRESENT, PREDOMINANTLY PMN ABUNDANT GRAM POSITIVE COCCI ABUNDANT GRAM NEGATIVE RODS Performed at [facility], [place] [address] [place], [place] [address]    Culture   Final    FEW STAPHYLOCOCCUS AUREUS FEW STREPTOCOCCUS ANGINOSIS Beta hemolytic streptococci are predictably susceptible to penicillin and other beta lactams. Susceptibility testing not routinely performed. RARE EIKENELLA CORRODENS Usually susceptible to penicillin and other beta lactam agents,quinolones,macrolides and tetracyclines. MIXED ANAEROBIC FLORA PRESENT.  CALL LAB IF FURTHER IID REQUIRED.    Report Status 06/26/2020 FINAL  Final   Organism ID, Bacteria STAPHYLOCOCCUS AUREUS  Final      Susceptibility   Staphylococcus aureus - MIC*    CIPROFLOXACIN <=0.5 SENSITIVE Sensitive     ERYTHROMYCIN <=0.25 SENSITIVE Sensitive     GENTAMICIN <=0.5 SENSITIVE Sensitive     OXACILLIN 0.5 SENSITIVE Sensitive     TETRACYCLINE <=1 SENSITIVE Sensitive     VANCOMYCIN <=0.5 SENSITIVE Sensitive     TRIMETH/SULFA <=10 SENSITIVE Sensitive     CLINDAMYCIN <=0.25 SENSITIVE Sensitive     RIFAMPIN <=0.5 SENSITIVE Sensitive     Inducible Clindamycin NEGATIVE Sensitive     * FEW STAPHYLOCOCCUS AUREUS  Anaerobic culture     Status: None (Preliminary result)   Collection Time: 06/25/20  1:11 PM   Specimen: PATH Other; Tissue  Result Value Ref Range Status   Specimen Description   Final    TISSUE DISTAL FEMUR Performed at [facility], [address], [place], [place] [address]    Special Requests   Final    NONE Performed at [facility], [address] [place], [place] [address]    Gram Stain   Final    NO WBC SEEN RARE Lonell Grandchild NEGATIVE RODS RARE GRAM POSITIVE COCCI Performed at [facility], [place] [address], [place], [place] [address]    Culture PENDING  Incomplete   Report Status PENDING  Incomplete  Anaerobic culture     Status: None (Preliminary result)   Collection Time: 06/25/20   1:13 PM   Specimen: PATH Other; Tissue  Result Value Ref Range Status   Specimen Description   Final    TISSUE PROXIMAL TIBIA Performed at [facility], [address], [place], [place] [address]    Special Requests   Final    NONE Performed at [facility], [place], [place] [address]    Gram Stain   Final    NO WBC SEEN MODERATE GRAM NEGATIVE RODS FEW GRAM POSITIVE COCCI RARE GRAM POSITIVE RODS Performed at [facility], [place] [address], [place], [place] [address]    Culture PENDING  Incomplete   Report Status PENDING  Incomplete  Anaerobic culture     Status: None (Preliminary result)   Collection Time: 06/25/20  1:15 PM   Specimen: PATH Other; Tissue  Result Value Ref Range Status   Specimen Description   Final    TISSUE MEDIAL PROXIMAL TIBIA Performed at [facility], [address], [place], [place] [address]    Special Requests   Final    NONE Performed at [facility], [address] [place], [place] [address]    Gram Stain   Final    NO WBC SEEN FEW GRAM NEGATIVE RODS RARE GRAM POSITIVE COCCI Performed at [facility], [place] [address], [place], [place] [address]    Culture PENDING  Incomplete   Report Status PENDING  Incomplete  Radiology Studies: No results found.      Scheduled Meds:  vitamin C  250 mg Oral BID   Chlorhexidine Gluconate Cloth  6 each Topical Daily   enoxaparin (LOVENOX) injection  30 mg Subcutaneous Q24H   feeding supplement (ENSURE ENLIVE)  237 mL Oral BID BM   ferrous sulfate  325 mg Oral Q breakfast   multivitamin with minerals  1 tablet Oral Daily   nicotine  21 mg Transdermal Daily   senna  1 tablet Oral Daily   Continuous Infusions:  sodium chloride 100 mL/hr at 06/26/20 1700   ampicillin-sulbactam (UNASYN) IV Stopped (06/27/20 0220)     LOS: 7 days    Time spent: 35 minutes.     Deatra James, MD [facility]   If 7PM-7AM, please contact night-coverage [email or web address]  06/27/2020, 10:01 AM

## 2020-06-27 NOTE — Progress Notes (Signed)
Mobility Specialist - Progress Note   06/27/20 1349  Mobility  Activity Ambulated in hall  Level of Assistance Modified independent, requires aide device or extra time  Assistive Device Front wheel walker  Distance Ambulated (ft) 180 ft  Mobility Response Tolerated well  Mobility performed by Mobility specialist  $Mobility charge 1 Mobility    Pre-mobility: 91 HR, 108/72 BP, 100% SpO2 Post-mobility: 99 HR, 109/78 BP, 100% SpO2   Pt was lying in bed with significant other present in room upon arrival. Pt agreed to session. Pt initially denied any pain, dizziness, or nausea. Pt was modI in sit-to-stand and SBA during ambulation. After ambulating 90', pt c/o leg pain being a "10/10", limiting further mobility. Pt took 1 standing break during session to relieve pressure on RLE. Pt ambulated a total of 180' using RW with UE support to offset weight on R leg. Overall, pt tolerated session well. Pt was left in bed with phone/call bell in reach. Nurse was notified of performance.    Filiberto Pinks Mobility Specialist 06/27/20, 2:05 PM

## 2020-06-27 NOTE — Consult Note (Addendum)
WOC Nurse Consult Note: Reason for Consult: Consult requested for Vac dressing to right leg Wound type: 2 full thickness post-op wounds located just below knee.  Knee with sutures intact and well-approximated Measurement: Right outer leg 1X3X.3cm right inner leg 3X1X.3cm; both are beefy red with small amt blood-tinged drainage Dressing procedure/placement/frequency: Pt medicated for pain prior to the procedure and tolerated with mod amt discomfort.  Applied one piece balck foam to each wound and bridged together to cont suction.  WOC will plan to change again on Fri if patient is still in the hospital at that time.  Cammie Mcgee MSN, RN, CWOCN, [place], CNS [phone]

## 2020-06-27 NOTE — Progress Notes (Signed)
Nutrition Follow Up Note   DOCUMENTATION CODES:   Non-severe (moderate) malnutrition in context of social or environmental circumstances  INTERVENTION:   Ensure Enlive po BID, each supplement provides 350 kcal and 20 grams of protein  MVI daily   Add Vitamin C 250mg  po BID   Pt at high refeed risk; recommend monitor K, Mg and P labs daily as oral intake improves.   NUTRITION DIAGNOSIS:   Moderate Malnutrition related to social / environmental circumstances as evidenced by moderate fat depletion, moderate muscle depletion, 14 percent weight loss in 8 months.  GOAL:   Patient will meet greater than or equal to 90% of their needs  -progressing   MONITOR:   PO intake, Supplement acceptance, Labs, Weight trends, Skin, I & O's  ASSESSMENT:   29 y.o. 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   Pt s/p I & D 8/12 and 8/15 with VAC placement  Pt with slightly improved appetite and oral intake in hospital; pt eating <50% of meals but is drinking some Ensure supplements. Pt s/p I & D with VAC placement; RD will add vitamin C to support wound healing. No new weight since 8/12; will request weekly weights.   Medications reviewed and include: lovenox, ferrous sulfate, MVI, nicotine, senokot, NaCl @100ml /hr, unasyn  Labs reviewed: wbc- 10.6(H), Hgb 7.2(L), Hct 21.8(L)  Diet Order:   Diet Order            Diet regular Room service appropriate? Yes; Fluid consistency: Thin  Diet effective now                EDUCATION NEEDS:   Education needs have been addressed  Skin:  Skin Assessment: Reviewed RN Assessment (Right outer leg 1X3X.3cm, right inner leg 3X1X.3cm, VAC)  Last BM:  8/17  Height:   Ht Readings from Last 1 Encounters:  06/21/20 4\' 11"  (1.499 m)    Weight:   Wt Readings from Last 1 Encounters:  06/21/20 40.3 kg     Ideal Body Weight:  44.5 kg  BMI:  Body mass index is 17.96 kg/m.  Estimated Nutritional Needs:   Kcal:  1400-1600kcal/day  Protein:  70-80g/day  Fluid:  1.2-1.4L/day  08/21/20 MS, RD, LDN Please refer to [facility] for RD and/or RD on-call/weekend/after hours pager

## 2020-06-28 DIAGNOSIS — A400 Sepsis due to streptococcus, group A: Secondary | ICD-10-CM

## 2020-06-28 LAB — URINE DRUG SCREEN, QUALITATIVE (ARMC ONLY)
Amphetamines, Ur Screen: NOT DETECTED
Barbiturates, Ur Screen: NOT DETECTED
Benzodiazepine, Ur Scrn: NOT DETECTED
Cannabinoid 50 Ng, Ur ~~LOC~~: NOT DETECTED
Cocaine Metabolite,Ur ~~LOC~~: POSITIVE — AB
MDMA (Ecstasy)Ur Screen: NOT DETECTED
Methadone Scn, Ur: NOT DETECTED
Opiate, Ur Screen: POSITIVE — AB
Phencyclidine (PCP) Ur S: NOT DETECTED
Tricyclic, Ur Screen: NOT DETECTED

## 2020-06-28 MED ORDER — AMOXICILLIN-POT CLAVULANATE 875-125 MG PO TABS: 1.0000 | ORAL_TABLET | Freq: Two times a day (BID) | ORAL | 0 refills | Status: AC

## 2020-06-28 MED ORDER — RISAQUAD PO CAPS
2.0000 | ORAL_CAPSULE | Freq: Two times a day (BID) | ORAL | Status: DC
  Administered 2020-06-28 – 2020-06-29 (×2): 2 via ORAL
  Filled 2020-06-28 (×2): qty 2

## 2020-06-28 MED ORDER — BACID PO TABS
2.0000 | ORAL_TABLET | Freq: Two times a day (BID) | ORAL | Status: DC
  Filled 2020-06-28: qty 2

## 2020-06-28 MED ORDER — ASCORBIC ACID 250 MG PO TABS: 250.0000 mg | ORAL_TABLET | Freq: Two times a day (BID) | ORAL | 0 refills | Status: AC

## 2020-06-28 MED ORDER — DIPHENHYDRAMINE HCL 25 MG PO CAPS: 25.0000 mg | ORAL_CAPSULE | Freq: Four times a day (QID) | ORAL | Status: DC | PRN

## 2020-06-28 MED ORDER — MELATONIN 5 MG PO TABS
5.0000 mg | ORAL_TABLET | Freq: Every day | ORAL | Status: DC
  Administered 2020-06-28: 5 mg via ORAL
  Filled 2020-06-28: qty 1

## 2020-06-28 MED ORDER — NICOTINE 21 MG/24HR TD PT24: 21.0000 mg | MEDICATED_PATCH | Freq: Every day | TRANSDERMAL | 0 refills | Status: DC

## 2020-06-28 MED ORDER — ADULT MULTIVITAMIN W/MINERALS CH: 1.0000 | ORAL_TABLET | Freq: Every day | ORAL | 0 refills | Status: AC

## 2020-06-28 MED ORDER — MELATONIN 5 MG PO TABS: 5.0000 mg | ORAL_TABLET | Freq: Every day | ORAL | 0 refills | Status: AC

## 2020-06-28 MED ORDER — AMOXICILLIN-POT CLAVULANATE 250-62.5 MG/5ML PO SUSR: 250.0000 mg | Freq: Two times a day (BID) | ORAL | 0 refills | Status: DC

## 2020-06-28 NOTE — Progress Notes (Signed)
ID Pt wants to go home Patient Vitals for the past 24 hrs:  BP Temp Temp src Pulse Resp SpO2  06/28/20 1945 112/75 98.3 F (36.8 C) Oral 95 18 100 %  06/28/20 1226 97/61 98.1 F (36.7 C) Oral 91 15 100 %  06/28/20 0517 100/70 98.3 F (36.8 C) Oral 82 18 100 %   O/e awake and alert Rt leg I/D site has wound vac  Labs CBC Latest Ref Rng & Units 06/26/2020 06/24/2020 06/23/2020  WBC 4.0 - 10.5 K/uL 10.6(H) 11.9(H) 11.7(H)  Hemoglobin 12.0 - 15.0 g/dL 7.2(L) 9.9(L) 9.6(L)  Hematocrit 36 - 46 % 21.8(L) 29.5(L) 27.7(L)  Platelets 150 - 400 K/uL 421(H) 301 261    CMP Latest Ref Rng & Units 06/26/2020 06/25/2020 06/24/2020  Glucose 70 - 99 mg/dL 94 95 96  BUN 6 - 20 mg/dL 11 6 6   Creatinine 0.44 - 1.00 mg/dL 8.50 2.77  Sodium 135 - 145 mmol/L 139 135 138  Potassium 3.5 - 5.1 mmol/L 3.6 3.7 3.3(L)  Chloride 98 - 111 mmol/L 105 102 104  CO2 22 - 32 mmol/L 27 23 24   Calcium 8.9 - 10.3 mg/dL 7.8(L) 8.4(L) 8.4(L)  Total Protein 6.5 - 8.1 g/dL - - -  Total Bilirubin 0.3 - 1.2 mg/dL - - -  Alkaline Phos 38 - 126 U/L - - -  AST 15 - 41 U/L - - -  ALT 0 - 44 U/L - - -    ?Complicated skin and soft infection of the right leg in a patient with intravenous drug use. The wound has been debrided and cultures have shows MSSA, strep, Eikenella corrodens. On unasyn-  We will evaluate the wound when the wound VAC is being changed.  HIV and hepatitis C.neg Discussed the management with the patient If she leaves AMA/discharge - need to go on augmentin

## 2020-06-28 NOTE — TOC Initial Note (Signed)
Transition of Care [facility]) - Initial/Assessment Note    Patient Details  Name: Lori Pollard MRN: [id number] Date of Birth: 11/03/1991  Transition of Care [facility]) CM/SW Contact:    Chapman Fitch, RN Phone Number: 06/28/2020, 4:04 PM  Clinical Narrative:                 Patient admitted with cellulitis of the knee s/p wound vac placement.  Cellulitis due to injection of suboxone in the knee    Patient has 3 children.  States they are under the care of their paternal aunt.   When discharge disposition is discussed with patient she state her plans is to go to [facility] at discharge which is a recovery center in [facility].  RNCM spoke with Dorene Grebe at [facility].  She confirmed that she has been in discussion with the patient.  Dorene Grebe states that patient should be eligible for the program "there are just a few things that have to be confirmed"  Patient currently with a wound vac in place.  Per Dorene Grebe, if wound vac in place she would not be a candidate for the program until the wound vac was returned.  Dorene Grebe states that patient could come with a dressing in place, but the patient would be responsible for managing dressing on her own  TOC reached out to all local home health agencies and they are unable to accept her for wound vac or dressing care.  MD's and bedside RN updated  Patient would prefer to go to [facility] house with dressing changes, and her manage those  Patient states if she has to have the wound vac she could stay at her mother's home at discharge.  If this is the disposition it would need to be determined if the outpatient wound care center could accept the patient   Anticipated discharge to transition to oral antibiotics, dressing changes, and [facility]  Expected Discharge Plan: Home/Self Care Barriers to Discharge: No Home Care Agency will accept this patient   Patient Goals and CMS Choice        Expected Discharge Plan and  Services Expected Discharge Plan: Home/Self Care                                              Prior Living Arrangements/Services     Patient language and need for interpreter reviewed:: Yes              Criminal Activity/Legal Involvement Pertinent to Current Situation/Hospitalization: No - Comment as needed  Activities of Daily Living Home Assistive Devices/Equipment: None ADL Screening (condition at time of admission) Patient's cognitive ability adequate to safely complete daily activities?: Yes Is the patient deaf or have difficulty hearing?: No Does the patient have difficulty seeing, even when wearing glasses/contacts?: No Does the patient have difficulty concentrating, remembering, or making decisions?: No Patient able to express need for assistance with ADLs?: Yes Does the patient have difficulty dressing or bathing?: No Independently performs ADLs?: Yes (appropriate for developmental age) Does the patient have difficulty walking or climbing stairs?: No Weakness of Legs: None Weakness of Arms/Hands: None  Permission Sought/Granted                  Emotional Assessment Appearance:: Appears stated age     Orientation: : Oriented to Self, Oriented to Place, Oriented to  Time,  Oriented to Situation, Fluctuating Orientation (Suspected and/or reported Sundowners) Alcohol / Substance Use: Illicit Drugs Psych Involvement: No (comment)  Admission diagnosis:  Cellulitis [L03.90] Knee pain, acute [M25.569] Chest pain [R07.9] Cellulitis, unspecified cellulitis site [L03.90] Patient Active Problem List   Diagnosis Date Noted  . Malnutrition of moderate degree 06/21/2020  . Cellulitis 06/20/2020  . Sepsis (HCC) 06/20/2020  . Chest pain 06/20/2020  . Polysubstance abuse (HCC) 06/20/2020  . Status post cesarean delivery 11/03/2019  . Vaginal bleeding in pregnancy 11/01/2019  . [redacted] weeks gestation of pregnancy 11/01/2019  . Supervision of high-risk  pregnancy with history of trophoblastic disease in second trimester 07/14/2019  . History of cesarean delivery 07/14/2019  . Suboxone maintenance treatment complicating pregnancy, antepartum (HCC) 07/14/2019  . Late prenatal care affecting pregnancy in second trimester 07/14/2019  . Anemia during pregnancy in second trimester 07/14/2019  . Bipolar disorder (HCC) 09/18/2015  . PTSD (post-traumatic stress disorder) 09/18/2015  . RAD (reactive airway disease) with wheezing 09/18/2015  . History of narcotic addiction (HCC) 09/18/2015  . Constipation due to pain medication therapy 09/18/2015  . Cervical pain 02/28/2009   PCP:  Patient, No Pcp Per Pharmacy:   [facility] [address]), [place] - [address]) [place] [address] Phone: [phone] Fax: [phone]     Social Determinants of Health (SDOH) Interventions    Readmission Risk Interventions No flowsheet data found.

## 2020-06-28 NOTE — Progress Notes (Signed)
  Subjective: 3 Days Post-Op Procedure(s) (LRB): IRRIGATION AND DEBRIDEMENT WOUND-Right Leg (Right) Patient reports pain as mild.   Patient is well, and has had no acute complaints or problems Plan is to go to substance abuse after hospital stay. Negative for chest pain and shortness of breath Fever: no Gastrointestinal:negative for nausea and vomiting  Objective: Vital signs in last 24 hours: Temp:  [98.3 F (36.8 C)-98.4 F (36.9 C)] 98.3 F (36.8 C) (08/19 0517) Pulse Rate:  [81-97] 82 (08/19 0517) Resp:  [18-20] 18 (08/19 0517) BP: (100-109)/(70-77) 100/70 (08/19 0517) SpO2:  [100 %] 100 % (08/19 0517)  Intake/Output from previous day:  Intake/Output Summary (Last 24 hours) at 06/28/2020 0708 Last data filed at 06/28/2020 0400 Gross per 24 hour  Intake 2786.19 ml  Output 800 ml  Net 1986.19 ml    Intake/Output this shift: No intake/output data recorded.  Labs: Recent Labs    06/26/20 0952  HGB 7.2*   Recent Labs    06/26/20 0952  WBC 10.6*  RBC 2.26*  HCT 21.8*  PLT 421*   Recent Labs    06/26/20 0952  NA 139  K 3.6  CL 105  CO2 27  BUN 11  CREATININE 0.44  GLUCOSE 94  CALCIUM 7.8*   No results for input(s): LABPT, INR in the last 72 hours.   EXAM General - Patient is Alert and Oriented Extremity - Neurovascular intact Sensation intact distally Dorsiflexion/Plantar flexion intact Compartment soft Dressing/Incision - wound vac intact and draining.  Appears to be dry with no drainage.  No increasing erythema. Motor Function - intact, moving foot and toes well on exam.  90 degrees of flexion with full extension on the right knee.  Past Medical History:  Diagnosis Date  . Cervicalgia   . History of chickenpox   . Hives     Assessment/Plan: 3 Days Post-Op Procedure(s) (LRB): IRRIGATION AND DEBRIDEMENT WOUND-Right Leg (Right) Active Problems:   Cellulitis   Sepsis (HCC)   Chest pain   Polysubstance abuse (HCC)   Malnutrition of  moderate degree  Estimated body mass index is 17.96 kg/m as calculated from the following:   Height as of this encounter: 4\' 11"  (1.499 m).   Weight as of this encounter: 40.3 kg. Advance diet Up with therapy  Continue IV Abx.  Changed to Unasyn.  Infectious Disease consult appreciated. Wound care team to continue wound VAC management.  Improving. Post op Anemia with Hgb of 7.2.   DVT Prophylaxis - Lovenox Weight-Bearing as tolerated to Right leg  , PA-C Orthopaedic Surgery 06/28/2020, 7:08 AM

## 2020-06-28 NOTE — Progress Notes (Signed)
Physical Therapy Treatment Patient Details Name: Lori Pollard MRN: [id number] DOB: December 01, 1990 Today's Date: 06/28/2020    History of Present Illness Lori Pollard is a 29 y/o female who was admitted for complaints worsening  R knee pain and erythema. Pt underwent I&D of R knee on 8/16 and a wound vac was placed. 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    PT Comments    Pt seated EOB upon arrival to room and agreeable to participate in PT session. Pt reports no pain today and states that her pain has been well under control. Pt stood with mod I for wound vac line management and ambulated around nurses station twice using RW mod I. Pt ambulated 10 feet in 7 seconds and maintained steady pace throughout ambulation distance and noted no LOB. AROM R knee: 19-102 degrees. Pt educated on QS with overpressure for improved R knee extension and use of towel roll under ankle to utilize when resting to help promote improved range. Pt is mod I for all mobility and this time and no longer requires skilled PT services. Will discharge from PT caseload. If pt's status changes and skilled services needed, please re-consult [place].    Follow Up Recommendations  No PT follow up;Supervision for mobility/OOB     Equipment Recommendations  Rolling walker with 5" wheels    Recommendations for Other Services       Precautions / Restrictions Precautions Precautions: Fall Restrictions Weight Bearing Restrictions: Yes RLE Weight Bearing: Weight bearing as tolerated    Mobility  Bed Mobility Overal bed mobility: Modified Independent             General bed mobility comments: Mod I for line management of wound vac lines.  Transfers Overall transfer level: Modified independent Equipment used: Rolling walker (2 wheeled) Transfers: Sit to/from Stand Sit to Stand: Modified independent (Device/Increase time)         General  transfer comment: Mod I for use of RW and wound vac line management.  Ambulation/Gait Ambulation/Gait assistance: Modified independent (Device/Increase time) Gait Distance (Feet): 420 Feet Assistive device: Rolling walker (2 wheeled) Gait Pattern/deviations: Step-through pattern Gait velocity: 10 feet in 7 seconds   General Gait Details: Pt ambulated 420 feet using RW with reciprocal gait pattern, steady gait speed, and no LOB noted. Pt with little push off of R foot and ambulates on toes with slight inversion of R foot.   Stairs             Wheelchair Mobility    Modified Rankin (Stroke Patients Only)       Balance Overall balance assessment: Modified Independent Sitting-balance support: Feet supported Sitting balance-Leahy Scale: Normal     Standing balance support: Bilateral upper extremity supported;No upper extremity supported Standing balance-Leahy Scale: Good Standing balance comment: pt able to stand EOB without BUE assist for static standing and no LOB noted; BUE on RW with dynamic activities with no LOB                            Cognition Arousal/Alertness: Awake/alert Behavior During Therapy: WFL for tasks assessed/performed Overall Cognitive Status: Within Functional Limits for tasks assessed                                        Exercises Other Exercises Other Exercises: QS  with overpressure for promotion of RLE knee extension x 10 with 3 second hold Other Exercises: pt educated on towel roll under ankle to promote increased knee extension range Other Exercises: AROM R knee: 19 to 102 degrees    General Comments        Pertinent Vitals/Pain Pain Assessment: No/denies pain    Home Living                      Prior Function            PT Goals (current goals can now be found in the care plan section) Acute Rehab PT Goals Patient Stated Goal: to not hurt PT Goal Formulation: With patient Time For Goal  Achievement: 07/10/20 Potential to Achieve Goals: Good Progress towards PT goals: Goals met/education completed, patient discharged from PT    Frequency    Other (Comment) (last session)      PT Plan Current plan remains appropriate    Co-evaluation              AM-PAC PT "6 Clicks" Mobility   Outcome Measure  Help needed turning from your back to your side while in a flat bed without using bedrails?: None Help needed moving from lying on your back to sitting on the side of a flat bed without using bedrails?: None Help needed moving to and from a bed to a chair (including a wheelchair)?: A Little Help needed standing up from a chair using your arms (e.g., wheelchair or bedside chair)?: None Help needed to walk in hospital room?: A Little Help needed climbing 3-5 steps with a railing? : A Little 6 Click Score: 21    End of Session Equipment Utilized During Treatment: Gait belt Activity Tolerance: Patient tolerated treatment well Patient left: in bed;with call bell/phone within reach;with nursing/sitter in room Nurse Communication: Mobility status PT Visit Diagnosis: Unsteadiness on feet (R26.81);Other abnormalities of gait and mobility (R26.89);Muscle weakness (generalized) (M62.81);Pain     Time: 0301-3143 PT Time Calculation (min) (ACUTE ONLY): 14 min  Charges:  $Gait Training: 8-22 mins                     Vale Haven, SPT   Philipp Callegari 06/28/2020, 1:08 PM

## 2020-06-28 NOTE — Progress Notes (Signed)
PROGRESS NOTE    Lori Pollard  KYH:[id number] DOB: Nov 22, 1990 DOA: 06/20/2020 PCP: Patient, No Pcp Per   Brief Narrative: 29 year old with past medical history significant for narcotic abuse previously on Suboxone, polysubstance abuse, bipolar, PTSD who presented with worsening right knee pain and erythema.  Patient was trying to inject Suboxone film that she got from a friend through IV into her right lateral knee about a week ago.  She developed small induration area in her right knee that is spread to her thigh and leg.  Presented with tachycardia, leukocytosis, lactic acid 2.6.  Potassium of 2.9.    06/28/2020 -postop day 3, status post ear irrigation debridement of her right leg wound-wound VAC in place patient remains on IV antibiotics of Unasyn  Patient threatened to leave AMA Our options are limited with recommending IV antibiotics and the wound VAC Patient will not be able to go to rehab with wound VAC and IV antibiotics  Our choices are limited in regards to discharge ---if need be we will switch her antibiotics per ID recommendation to Augmentin for 2 weeks, orthopedic is agreed to DC wound VAC and teach the patient wound care    Subjective: The patient was seen and examined this morning stable compliant afebrile normotensive.  No issues overnight  Assessment & Plan:   Active Problems:   Cellulitis   Sepsis ([place])   Chest pain   Polysubstance abuse (HCC)   Malnutrition of moderate degree  Sepsis secondary to cellulitis and abscess right LE  Postop day #3 Status post irrigation and debridement of her right leg wound-wound VAC placement  -Patient was seen and examined, remains hemodynamically stable  -On admission patient met the criteria for sepsis with tachycardia, leukocytosis -source of infection right knee infection -MRI negative for septic knee, There is some fluid along the superficial fascia of the gastrocnemius muscle suggestive of fasciitis.  Negative for myositis or gas along fascial planes to suggest necrotizing fasciitis. -Underwent I and D at bedside, draining purulent secretion.   Follow culture: growing  Culture few staph aureous. MSSA, and Streptococcus. angionisis and Eikenella Corrodens.  (Pansensitive)  -Treated initially with vancomycin, cefepime and Flagyl.-Change to Ancef continue Flagyl till 06/24/2020  -Antibiotics was subsequently switched to Unasyn Since patient is threatening to leave AMA infectious disease recommended Augmentin for 2 weeks.   -MRI showed two new areas of abscess.  -8/16; Underwent right distal femur/knee irrigation and debridement. Right leg irrigation and debridement application of wound vac Right LE.   - culture from 8/16 few staph and sterp., prior culture staph, strep. angionisis and Eikenella Corrodens.  (Pansensitive)   Chest pain: Resolved EKG sinus rhythm troponin negative.  Hypokalemia: Resolved  Polysubstance abuse: Endorses use of IV heroin.  TOC team has completed consulted She is willing to go to rehab but they would not take her with the wound VAC Ortho recommended that wound VAC may be DC'd with proper wound care   Anemia; acute blood loss ; suspect post sx.  Transfuse for Hb less than 7.  n ron supplement.   Estimated body mass index is 17.96 kg/m as calculated from the following:   Height as of this encounter: '4\' 11"'  (1.499 m).   Weight as of this encounter: 40.3 kg.   DVT prophylaxis: Lovenox Code Status: Full code Family Communication: Care discussed with patient Disposition Plan:  Status is: Inpatient  Remains inpatient appropriate because:Hemodynamically unstable   Disposition: The patient is from: Home  Anticipated d/c is to: She is threatening to leave AMA Our choices are limited in regards to discharge ---if need be we will switch her antibiotics per ID recommendation to Augmentin for 2 weeks, orthopedic is agreed to DC wound VAC and  teach the patient wound care  We would like her to go to drug rehab               Anticipated d/c date is: in AM               Patient currently is not medically stable to d/c.  As we still recommend IV antibiotics, wound VAC        Consultants:   Dr Posey Pronto   Procedures:  -8/16; Underwent right distal femur/knee irrigation and debridement. Right leg irrigation and debridement application of wound vac Right L  Antimicrobials:  Unasyn  Objective: Vitals:   06/27/20 1208 06/27/20 1951 06/28/20 0517 06/28/20 1226  BP: 109/77 108/75 100/70 97/61  Pulse: 87 97 82 91  Resp: '18 20 18 15  ' Temp: 98.4 F (36.9 C) 98.3 F (36.8 C) 98.3 F (36.8 C) 98.1 F (36.7 C)  TempSrc: Oral Oral Oral Oral  SpO2: 100% 100% 100% 100%  Weight:      Height:        Intake/Output Summary (Last 24 hours) at 06/28/2020 1619 Last data filed at 06/28/2020 1109 Gross per 24 hour  Intake 2786.19 ml  Output 1000 ml  Net 1786.19 ml   Filed Weights   06/20/20 1605 06/20/20 2143 06/21/20 0106  Weight: 48 kg 41.7 kg 40.3 kg      Physical Exam:   General:  Alert, oriented, cooperative, no distress;   HEENT:  Normocephalic, PERRL, otherwise with in Normal limits   Neuro:  CNII-XII intact. , normal motor and sensation, reflexes intact   Lungs:   Clear to auscultation BL, Respirations unlabored, no wheezes / crackles  Cardio:    S1/S2, RRR, No murmure, No Rubs or Gallops   Abdomen:   Soft, non-tender, bowel sounds active all four quadrants,  no guarding or peritoneal signs.  Muscular skeletal:  Limited exam - in bed, able to move all 4 extremities, Normal strength,  2+ pulses,  symmetric, No pitting edema  Skin:  Dry, warm to touch, negative for any Rashes,Right LE with wound vac  Wounds: Please see nursing documentation         Wounds: Right LE with wound with vac.Marland KitchenMarland KitchenPlease see nursing documentation           Data Reviewed: I have personally reviewed following labs and imaging  studies  CBC: Recent Labs  Lab 06/22/20 0655 06/23/20 0634 06/24/20 0500 06/26/20 0952  WBC 9.4 11.7* 11.9* 10.6*  HGB 9.7* 9.6* 9.9* 7.2*  HCT 29.3* 27.7* 29.5* 21.8*  MCV 95.1 91.1 93.4 96.5  PLT 226 261 301 818*   Basic Metabolic Panel: Recent Labs  Lab 06/22/20 0526 06/23/20 0634 06/24/20 0500 06/25/20 0457 06/26/20 0952  NA 139 136 138 135 139  K 3.5 3.1* 3.3* 3.7 3.6  CL 108 104 104 102 105  CO2 20* '23 24 23 27  ' GLUCOSE 82 97 96 95 94  BUN '7 7 6 6 11  ' CREATININE 0.57 0.52 0.58 0.48 0.44  CALCIUM 8.1* 7.9* 8.4* 8.4* 7.8*  MG 1.9  --   --  1.9  --   PHOS 2.9  --   --   --   --    GFR: Estimated Creatinine Clearance:  65.4 mL/min (by C-G formula based on SCr of 0.44 mg/dL). Liver Function Tests: Recent Labs  Lab 06/22/20 0526  AST 35  ALT 24  ALKPHOS 73  BILITOT 0.6  PROT 5.6*  ALBUMIN 2.3*    Recent Results (from the past 240 hour(s))  Culture, blood (Routine x 2)     Status: None   Collection Time: 06/20/20  4:07 PM   Specimen: BLOOD  Result Value Ref Range Status   Specimen Description BLOOD BLOOD RIGHT HAND  Final   Special Requests   Final    BOTTLES DRAWN AEROBIC AND ANAEROBIC Blood Culture results may not be optimal due to an inadequate volume of blood received in culture bottles   Culture   Final    NO GROWTH 5 DAYS Performed at [facility], [address], [place], [place] [address]    Report Status 06/25/2020 FINAL  Final  Culture, blood (Routine x 2)     Status: None   Collection Time: 06/20/20  9:39 PM   Specimen: BLOOD  Result Value Ref Range Status   Specimen Description BLOOD BLOOD LEFT FOREARM  Final   Special Requests   Final    BOTTLES DRAWN AEROBIC AND ANAEROBIC Blood Culture adequate volume   Culture   Final    NO GROWTH 5 DAYS Performed at [facility], [address], [place], [place] [address]    Report Status 06/26/2020 FINAL  Final  SARS Coronavirus 2 by RT PCR (hospital order, performed in [place] hospital lab) Nasopharyngeal Nasopharyngeal Swab     Status: None   Collection Time: 06/20/20 10:18 PM   Specimen: Nasopharyngeal Swab  Result Value Ref Range Status   SARS Coronavirus 2 NEGATIVE NEGATIVE Final    Comment: (NOTE) SARS-CoV-2 target nucleic acids are NOT DETECTED.  The SARS-CoV-2 RNA is generally detectable in upper and lower respiratory specimens during the acute phase of infection. The lowest concentration of SARS-CoV-2 viral copies this assay can detect is 250 copies / mL. A negative result does not preclude SARS-CoV-2 infection and should not be used as the sole basis for treatment or other patient management decisions.  A negative result may occur with improper specimen collection / handling, submission of specimen other than nasopharyngeal swab, presence of viral mutation(s) within the areas targeted by this assay, and inadequate number of viral copies (<250 copies / mL). A negative result must be combined with clinical observations, patient history, and epidemiological information.  Fact Sheet for Patients:   StrictlyIdeas.no  Fact Sheet for Healthcare Providers: BankingDealers.co.za  This test is not yet approved or  cleared by the [place] FDA and has been authorized for detection and/or diagnosis of SARS-CoV-2 by FDA under an Emergency Use Authorization (EUA).  This EUA will remain in effect (meaning this test can be used) for the duration of the COVID-19 declaration under Section 564(b)(1) of the Act, 21 U.S.C. section 360bbb-3(b)(1), unless the authorization is terminated or revoked sooner.  Performed at [facility], [address] [place], [place] [address]   Aerobic/Anaerobic Culture (surgical/deep wound)     Status: None   Collection Time: 06/21/20  4:08 PM   Specimen: Wound; Abscess  Result Value Ref Range Status   Specimen Description   Final    WOUND Performed at [facility], [address], [place], [place] [address]    Special Requests NO ANAEROBIC SWAB SENT  Final   Gram Stain   Final    RARE  WBC PRESENT, PREDOMINANTLY PMN ABUNDANT GRAM POSITIVE COCCI ABUNDANT GRAM NEGATIVE RODS Performed at [facility] [place] [facility], [place] [address], [place], [place] [address]    Culture   Final    FEW STAPHYLOCOCCUS AUREUS FEW STREPTOCOCCUS ANGINOSIS Beta hemolytic streptococci are predictably susceptible to penicillin and other beta lactams. Susceptibility testing not routinely performed. RARE EIKENELLA CORRODENS Usually susceptible to penicillin and other beta lactam agents,quinolones,macrolides and tetracyclines. MIXED ANAEROBIC FLORA PRESENT.  CALL LAB IF FURTHER IID REQUIRED.    Report Status 06/26/2020 FINAL  Final   Organism ID, Bacteria STAPHYLOCOCCUS AUREUS  Final      Susceptibility   Staphylococcus aureus - MIC*    CIPROFLOXACIN <=0.5 SENSITIVE Sensitive     ERYTHROMYCIN <=0.25 SENSITIVE Sensitive     GENTAMICIN <=0.5 SENSITIVE Sensitive     OXACILLIN 0.5 SENSITIVE Sensitive     TETRACYCLINE <=1 SENSITIVE Sensitive     VANCOMYCIN <=0.5 SENSITIVE Sensitive     TRIMETH/SULFA <=10 SENSITIVE Sensitive     CLINDAMYCIN <=0.25 SENSITIVE Sensitive     RIFAMPIN <=0.5 SENSITIVE Sensitive     Inducible Clindamycin NEGATIVE Sensitive     * FEW STAPHYLOCOCCUS AUREUS  Anaerobic culture     Status: None (Preliminary result)   Collection Time: 06/25/20  1:11 PM   Specimen: PATH Other; Tissue  Result Value Ref Range Status   Specimen Description   Final    TISSUE DISTAL FEMUR Performed at [facility], [address] [place], [place] [address]    Special Requests   Final    NONE Performed at [facility], [address] [place], [place] [address]    Gram Stain   Final    NO WBC SEEN RARE Lonell Grandchild NEGATIVE RODS RARE GRAM POSITIVE COCCI    Culture   Final    HOLDING FOR POSSIBLE ANAEROBE Performed at [facility], [place]  [address] [place], [place] [address]    Report Status PENDING  Incomplete  Anaerobic culture     Status: None (Preliminary result)   Collection Time: 06/25/20  1:13 PM   Specimen: PATH Other; Tissue  Result Value Ref Range Status   Specimen Description   Final    TISSUE PROXIMAL TIBIA Performed at [facility], [address], [place], [place] [address]    Special Requests   Final    NONE Performed at [facility], [address] [place], [place] [address]    Gram Stain   Final    NO WBC SEEN MODERATE GRAM NEGATIVE RODS FEW GRAM POSITIVE COCCI RARE GRAM POSITIVE RODS    Culture   Final    HOLDING FOR POSSIBLE ANAEROBE Performed at [facility], [place] [address], [place], [place] [address]    Report Status PENDING  Incomplete  Anaerobic culture     Status: None (Preliminary result)   Collection Time: 06/25/20  1:15 PM   Specimen: PATH Other; Tissue  Result Value Ref Range Status   Specimen Description   Final    TISSUE MEDIAL PROXIMAL TIBIA Performed at [facility], [address] [place], [place] [address]    Special Requests   Final    NONE Performed at [facility], [address] [place],  [address]    Gram Stain   Final    NO WBC SEEN FEW GRAM NEGATIVE RODS RARE GRAM POSITIVE COCCI    Culture   Final    HOLDING FOR POSSIBLE ANAEROBE Performed at [facility], [place] [address], [place], [place] [address]    Report Status PENDING  Incomplete         Radiology Studies: No results found.      Scheduled Meds:  vitamin C  250 mg Oral BID   Chlorhexidine Gluconate Cloth  6 each Topical Daily   enoxaparin (LOVENOX) injection  30 mg Subcutaneous Q24H   feeding supplement (ENSURE ENLIVE)  237 mL Oral BID BM   ferrous sulfate  325 mg Oral Q breakfast   melatonin  5 mg Oral QHS   multivitamin with minerals  1 tablet Oral Daily   nicotine  21 mg Transdermal Daily   senna  1 tablet Oral Daily    Continuous Infusions:  ampicillin-sulbactam (UNASYN) IV 3 g (06/28/20 1552)     LOS: 8 days    Time spent: 35 minutes.     Deatra James, MD [facility]   If 7PM-7AM, please contact night-coverage [email or web address]  06/28/2020, 4:19 PM

## 2020-06-28 NOTE — Progress Notes (Signed)
Attempted to change midline dressing but patient refused and said she wanted it done later in the day.

## 2020-06-28 NOTE — Progress Notes (Signed)
Mobility Specialist - Progress Note   06/28/20 1258  Mobility  Activity Refused mobility  Mobility performed by Mobility specialist     Pt refused mobility d/t feeling tired. Per discussion with nurse, pt did ambulate 1 lap around nursing station this morning. Will attempt at another time.    Filiberto Pinks Mobility Specialist 06/28/20, 12:59 PM

## 2020-06-29 LAB — ANAEROBIC CULTURE: Gram Stain: NONE SEEN

## 2020-06-29 LAB — CBC
HCT: 25.1 % — ABNORMAL LOW (ref 36.0–46.0)
Hemoglobin: 8.7 g/dL — ABNORMAL LOW (ref 12.0–15.0)
MCH: 32.5 pg (ref 26.0–34.0)
MCHC: 34.7 g/dL (ref 30.0–36.0)
MCV: 93.7 fL (ref 80.0–100.0)
Platelets: 759 10*3/uL — ABNORMAL HIGH (ref 150–400)
RBC: 2.68 MIL/uL — ABNORMAL LOW (ref 3.87–5.11)
RDW: 13.6 % (ref 11.5–15.5)
WBC: 6.9 10*3/uL (ref 4.0–10.5)
nRBC: 0 % (ref 0.0–0.2)

## 2020-06-29 MED ORDER — IBUPROFEN 800 MG PO TABS
800.0000 mg | ORAL_TABLET | Freq: Three times a day (TID) | ORAL | 0 refills | Status: DC | PRN
Start: 2020-06-29 — End: 2020-12-31

## 2020-06-29 MED ORDER — SODIUM CHLORIDE 0.9 % IV SOLN
INTRAVENOUS | Status: DC | PRN
  Administered 2020-06-29: 250 mL via INTRAVENOUS

## 2020-06-29 MED ORDER — RISAQUAD PO CAPS: 2.0000 | ORAL_CAPSULE | Freq: Two times a day (BID) | ORAL | 0 refills | Status: AC

## 2020-06-29 NOTE — Discharge Instructions (Signed)
INSTRUCTIONS AFTER Surgery  o Remove items at home which could result in a fall. This includes throw rugs or furniture in walking pathways o ICE to the affected joint every three hours while awake for 30 minutes at a time, for at least the first 3-5 days, and then as needed for pain and swelling.  Continue to use ice for pain and swelling. You may notice swelling that will progress down to the foot and ankle.  This is normal after surgery.  Elevate your leg when you are not up walking on it.   o Continue to use the breathing machine you got in the hospital (incentive spirometer) which will help keep your temperature down.  It is common for your temperature to cycle up and down following surgery, especially at night when you are not up moving around and exerting yourself.  The breathing machine keeps your lungs expanded and your temperature down.   DIET:  As you were doing prior to hospitalization, we recommend a well-balanced diet.  DRESSING / WOUND CARE / SHOWERING  Per wound care nurse order:  Remove foam pad and iodoform packing.  Replace iodoform strip packing so that it covers wound beds entirely.  Cover iodoform strip packing with foam dressing.  Change iodoform packing daily.  Change foam dressing every 3 days  ACTIVITY  o Increase activity slowly as tolerated, but follow the weight bearing instructions below.   o No driving for 6 weeks or until further direction given by your physician.  You cannot drive while taking narcotics.  o No lifting or carrying greater than 10 lbs. until further directed by your surgeon. o Avoid periods of inactivity such as sitting longer than an hour when not asleep. This helps prevent blood clots.  o You may return to work once you are authorized by your doctor.     WEIGHT BEARING  Weightbearing as tolerated on the right leg.   EXERCISES Range of motion of the knee is to be performed daily.  CONSTIPATION  Constipation is defined medically as  fewer than three stools per week and severe constipation as less than one stool per week.  Even if you have a regular bowel pattern at home, your normal regimen is likely to be disrupted due to multiple reasons following surgery.  Combination of anesthesia, postoperative narcotics, change in appetite and fluid intake all can affect your bowels.   YOU MUST use at least one of the following options; they are listed in order of increasing strength to get the job done.  They are all available over the counter, and you may need to use some, POSSIBLY even all of these options:    Drink plenty of fluids (prune juice may be helpful) and high fiber foods Colace 100 mg by mouth twice a day  Senokot for constipation as directed and as needed Dulcolax (bisacodyl), take with full glass of water  Miralax (polyethylene glycol) once or twice a day as needed.  If you have tried all these things and are unable to have a bowel movement in the first 3-4 days after surgery call either your surgeon or your primary doctor.    If you experience loose stools or diarrhea, hold the medications until you stool forms back up.  If your symptoms do not get better within 1 week or if they get worse, check with your doctor.  If you experience "the worst abdominal pain ever" or develop nausea or vomiting, please contact the office immediately for further recommendations  for treatment.   ITCHING:  If you experience itching with your medications, try taking only a single pain pill, or even half a pain pill at a time.  You can also use Benadryl over the counter for itching or also to help with sleep.   TED HOSE STOCKINGS:  Use stockings on both legs until for at least 2 weeks or as directed by physician office. They may be removed at night for sleeping.  MEDICATIONS:  See your medication summary on the "After Visit Summary" that nursing will review with you.  You may have some home medications which will be placed on hold until you  complete the course of blood thinner medication.  It is important for you to complete the blood thinner medication as prescribed.  PRECAUTIONS:  If you experience chest pain or shortness of breath - call 911 immediately for transfer to the hospital emergency department.   If you develop a fever greater that 101 F, purulent drainage from wound, increased redness or drainage from wound, foul odor from the wound/dressing, or calf pain - CONTACT YOUR SURGEON.                                                   FOLLOW-UP APPOINTMENTS:  If you do not already have a post-op appointment, please call the office for an appointment to be seen by your surgeon.  Guidelines for how soon to be seen are listed in your "After Visit Summary", but are typically between 1-4 weeks after surgery.  OTHER INSTRUCTIONS:   Ice can be applied to the knee with a towel over the wound.  MAKE SURE YOU:  . Understand these instructions.  . Get help right away if you are not doing well or get worse.    Thank you for letting us be a part of your medical care team.  It is a privilege we respect greatly.  We hope these instructions will help you stay on track for a fast and full recovery!

## 2020-06-29 NOTE — Consult Note (Addendum)
WOC Nurse Consult Note: Reason for Consult: Vac dressing change to right leg Wound type: 2 full thickness post-op wounds located just below knee.  Knee with sutures intact and well-approximated Measurement: Right outer leg and right inner leg; both are beefy red with small amt blood-tinged drainage Dressing procedure/placement/frequency: Pt medicated for pain prior to the procedure and tolerated with mod amt discomfort.  Applied one piece balck foam to each wound and bridged together to cont suction.   Ortho service indicated Vac will be discontinued at discharge. Topical treatment orders provided for the bedside nurses as follows: BEDSIDE NURSE: When patient is ready to discharge. Remove Vac dressings.  Apply Iodoform gauze packing strips to each wound and cover with foam dressings.  Pt should perform Iodoform gauze packing dressing changes Q day after discharge. Foam dressing to right knee; change Q 3 days or PRN soiling. Cammie Mcgee MSN, RN, CWOCN, [place], CNS [phone]

## 2020-06-29 NOTE — Progress Notes (Signed)
  Subjective: 4 Days Post-Op Procedure(s) (LRB): IRRIGATION AND DEBRIDEMENT WOUND-Right Leg (Right) Patient reports pain as mild.   Patient is well, and has had no acute complaints or problems Plan is to go to substance abuse after hospital stay. Negative for chest pain and shortness of breath Fever: no Gastrointestinal:negative for nausea and vomiting  Objective: Vital signs in last 24 hours: Temp:  [97.7 F (36.5 C)-98.3 F (36.8 C)] 97.7 F (36.5 C) (08/20 0441) Pulse Rate:  [76-95] 76 (08/20 0441) Resp:  [15-18] 18 (08/20 0441) BP: (97-112)/(61-75) 100/73 (08/20 0441) SpO2:  [100 %] 100 % (08/20 0441)  Intake/Output from previous day:  Intake/Output Summary (Last 24 hours) at 06/29/2020 0712 Last data filed at 06/29/2020 0500 Gross per 24 hour  Intake 760.1 ml  Output 3100 ml  Net -2339.9 ml    Intake/Output this shift: No intake/output data recorded.  Labs: Recent Labs    06/26/20 0952  HGB 7.2*   Recent Labs    06/26/20 0952  WBC 10.6*  RBC 2.26*  HCT 21.8*  PLT 421*   Recent Labs    06/26/20 0952  NA 139  K 3.6  CL 105  CO2 27  BUN 11  CREATININE 0.44  GLUCOSE 94  CALCIUM 7.8*   No results for input(s): LABPT, INR in the last 72 hours.   EXAM General - Patient is Alert and Oriented Extremity - Neurovascular intact Sensation intact distally Dorsiflexion/Plantar flexion intact Compartment soft  No effusion.  No lower extremity edema.   Dressing/Incision - wound vac intact and draining.  Appears to be dry with no drainage.  No increasing erythema. Motor Function - intact, moving foot and toes well on exam.  110 degrees of flexion with full extension on the right knee.  Much improved.  Past Medical History:  Diagnosis Date  . Cervicalgia   . History of chickenpox   . Hives     Assessment/Plan: 4 Days Post-Op Procedure(s) (LRB): IRRIGATION AND DEBRIDEMENT WOUND-Right Leg (Right) Active Problems:   Cellulitis   Sepsis (HCC)   Chest  pain   Polysubstance abuse (HCC)   Malnutrition of moderate degree  Estimated body mass index is 17.96 kg/m as calculated from the following:   Height as of this encounter: 4\' 11"  (1.499 m).   Weight as of this encounter: 40.3 kg. Advance diet Up with therapy  Continue IV Abx.  Changed to Unasyn.  Infectious Disease consult appreciated. Wound care team to continue wound VAC management.  Improving. Follow-up at [facility] clinic orthopedics in 2 weeks Possible discharge to substance abuse home.  Change to oral antibiotics.  Remove wound VAC with wound management before discharge.  Patient will do daily dressing changes.  DVT Prophylaxis - Lovenox Weight-Bearing as tolerated to Right leg  [facility], PA-C Orthopaedic Surgery 06/29/2020, 7:12 AM

## 2020-06-29 NOTE — Discharge Summary (Signed)
Physician Discharge Summary [facility]    Patient: Lori Pollard                   Admit date: 06/20/2020   DOB: September 30, 1991             Discharge date:06/29/2020/9:37 AM BCW:[id number]                          PCP: Patient, No Pcp Per  Disposition: Home-patient is to report to drug rehab ASAP Recommendations for Outpatient Follow-up:   . Follow up: in 2 weeks -orthopedic team at [facility] clinic orthopedics  Discharge Condition: Stable   Code Status:   Code Status: Full Code  Diet recommendation: Regular healthy diet   Discharge Diagnoses:    Active Problems:   Cellulitis   Sepsis ([place])   Chest pain   Polysubstance abuse ([place])   Malnutrition of moderate degree   History of Present Illness/ Hospital Course [name] Summary:    29 year old with past medical history significant for narcotic abuse previously on Suboxone, polysubstance abuse, bipolar, PTSD who presented with worsening right knee pain and erythema.  Patient was trying to inject Suboxone film that she got from a friend through IV into her right lateral knee about a week ago.  She developed small induration area in her right knee that is spread to her thigh and leg.  Presented with tachycardia, leukocytosis, lactic acid 2.6.  Potassium of 2.9.    06/28/2020 -postop day 3, status post ear irrigation debridement of her right leg wound-wound VAC in place patient remains on IV antibiotics of Unasyn  Patient threatened to leave AMA Our options are limited with recommending IV antibiotics and the wound VAC Patient will not be able to go to rehab with wound VAC and IV antibiotics  Our choices are limited in regards to discharge --- PO antibiotics per ID recommendation to Augmentin for 2 weeks.  Orthopedic is agreed to DC wound VAC and teach the patient wound care    Subjective: The patient was seen and examined this morning stable compliant afebrile normotensive.  No issues  overnight  Assessment & Plan:   Active Problems:   Cellulitis   Sepsis ([place])   Chest pain   Polysubstance abuse ([facility])   Malnutrition of moderate degree  Sepsis secondary to cellulitis and abscess right LE  Postop day #4 Status post irrigation and debridement of her right leg wound-wound VAC placement  -Patient was seen and examined, remains hemodynamically stable  -On admission patient met the criteria for sepsis with tachycardia, leukocytosis -source of infection right knee infection -MRI negative for septic knee, There is some fluid along the superficial fascia of the gastrocnemius muscle suggestive of fasciitis. Negative for myositis or gas along fascial planes to suggest necrotizing fasciitis. -Underwent I and D at bedside, draining purulent secretion.   Follow culture: growing  Culture few staph aureous. MSSA, and Streptococcus. angionisis and Eikenella Corrodens.  (Pansensitive)  -Treated initially with vancomycin, cefepime and Flagyl.-Change to Ancef continue Flagyl till 06/24/2020  -Antibiotics was subsequently switched to Unasyn Since patient is threatening to leave AMA --infectious disease recommended Augmentin for 2 weeks.   -MRI showed two new areas of abscess.  -8/16; Underwent right distal femur/knee irrigation and debridement. Right leg irrigation and debridement application of wound vac Right LE.   - culture from 8/16 few staph and sterp., prior culture staph, strep. angionisis and Eikenella Corrodens.  (Pansensitive)   Chest  pain: Resolved EKG sinus rhythm troponin negative.  Hypokalemia: Resolved  Polysubstance abuse: Endorses use of IV heroin.  TOC team has completed consulted She is willing to go to rehab but they would not take her with the wound VAC Ortho recommended that wound VAC may be DC'd with proper wound care   Anemia; acute blood loss ; suspect post sx.  Transfuse for Hb less than 7.  n ron supplement.   Estimated body  mass index is 17.96 kg/m as calculated from the following:   Height as of this encounter: '4\' 11"'  (1.499 m).   Weight as of this encounter: 40.3 kg.     Disposition: The patient is from: Home   Anticipated d/c is to:  To be discharged home patient will go to rehab ASAP Our choices are limited in regards to discharge ---switch her antibiotics Augmentin per ID recommendation to Augmentin for 2 weeks, orthopedic is agreed to DC wound VAC and teach the patient wound care  Wound care recommendations:  At discharge. Remove Vac dressings.  Apply Iodoform gauze packing strips to each wound and cover with foam dressings.  Pt should perform Iodoform gauze packing dressing changes Q day after discharge. Foam dressing to right knee; change Q 3 days or PRN soiling. Julien Girt MSN, RN, CWOCN, CWCN-AP, CNS    Nutritional status:  Nutrition Problem: Moderate Malnutrition Etiology: social / environmental circumstances Signs/Symptoms: moderate fat depletion, moderate muscle depletion, percent weight loss Percent weight loss: 14 % Interventions: Ensure Enlive (each supplement provides 350kcal and 20 grams of protein), MVI   Discharge Instructions:   Discharge Instructions    Activity as tolerated - No restrictions   Complete by: As directed    Activity as tolerated - No restrictions   Complete by: As directed    Call MD for:  redness, tenderness, or signs of infection (pain, swelling, redness, odor or green/yellow discharge around incision site)   Complete by: As directed    Call MD for:  temperature >100.4   Complete by: As directed    Diet - low sodium heart healthy   Complete by: As directed    Discharge instructions   Complete by: As directed    Continue any type of drug use or abuse avoid any type of injection   Discharge instructions   Complete by: As directed    Recommending no narcotics, may alternate between Tylenol 650 mg p.o. every 8 hours and ibuprofen for pain  management.  Keep the wound as clean as possible, wound care as below. Follow-up with infectious disease and orthopedic team in 1-2 weeks  Apply Iodoform gauze packing strips to each wound and cover with foam dressings.  Pt should perform Iodoform gauze packing dressing changes Q day after discharge. Foam dressing to right knee; change Q 3 days or PRN soiling   Discharge wound care:   Complete by: As directed    Wound care per instruction and woundvac.   Discharge wound care:   Complete by: As directed    Apply Iodoform gauze packing strips to each wound and cover with foam dressings.  Pt should perform Iodoform gauze packing dressing changes Q day after discharge. Foam dressing to right knee; change Q 3 days or as needed - soiling.   Increase activity slowly   Complete by: As directed    Increase activity slowly   Complete by: As directed        Medication List    TAKE these medications   acidophilus  Caps capsule Take 2 capsules by mouth 2 (two) times daily for 14 days.   amoxicillin-clavulanate 875-125 MG tablet Commonly known as: Augmentin Take 1 tablet by mouth 2 (two) times daily for 14 days.   ascorbic acid 250 MG tablet Commonly known as: VITAMIN C Take 1 tablet (250 mg total) by mouth 2 (two) times daily.   ibuprofen 800 MG tablet Commonly known as: ADVIL Take 1 tablet (800 mg total) by mouth every 8 (eight) hours as needed for moderate pain.   melatonin 5 MG Tabs Take 1 tablet (5 mg total) by mouth at bedtime for 20 days.   multivitamin with minerals Tabs tablet Take 1 tablet by mouth daily.   nicotine 21 mg/24hr patch Commonly known as: NICODERM CQ - dosed in mg/24 hours Place 1 patch (21 mg total) onto the skin daily.       Follow-up Information    Leim Fabry, MD Follow up in 2 week(s).   Specialty: Orthopedic Surgery Why: For suture removal Contact information: [place] [address] [phone]              No  Known Allergies   Procedures /Studies:   DG Chest 1 View  Result Date: 06/21/2020 CLINICAL DATA:  29 year old female with IV drug use, infection around the knee. EXAM: CHEST  1 VIEW COMPARISON:  Chest radiograph 05/11/2020. FINDINGS: Portable AP upright view at 0042 hours. Lung volumes and mediastinal contours remain normal. There is new diffuse pulmonary vascular congestion. No pleural fluid. No pneumothorax. No confluent pulmonary opacity. Stable mild thoracic scoliosis. No acute osseous abnormality identified. Negative visible bowel gas pattern. IMPRESSION: Pulmonary vascular congestion without overt edema or pleural effusion. Electronically Signed   By: Genevie Ann M.D.   On: 06/21/2020 00:57   DG Knee 1-2 Views Right  Result Date: 06/21/2020 CLINICAL DATA:  29 year old female with IV drug use, infection around the knee. EXAM: RIGHT KNEE - 1-2 VIEW COMPARISON:  None. FINDINGS: Normal joint spaces and alignment. Bone mineralization is within normal limits. No evidence of joint effusion. Anterior, medial and lateral soft tissue swelling and stranding, with abnormal soft tissue gas tracking about the knee. No radiopaque foreign body identified. IMPRESSION: 1. Soft tissue swelling and multifocal soft tissue gas suspicious for a necrotizing or gas-forming infection. 2. No osseous abnormality or joint effusion. Electronically Signed   By: Genevie Ann M.D.   On: 06/21/2020 00:59   MR KNEE RIGHT WO CONTRAST  Result Date: 06/24/2020 CLINICAL DATA:  Soft tissue infection re-evaluation. EXAM: MRI OF THE RIGHT KNEE WITHOUT CONTRAST TECHNIQUE: Multiplanar, multisequence MR imaging of the knee was performed. No intravenous contrast was administered. COMPARISON:  None. FINDINGS: MENISCI Medial: Intact. Lateral: Intact. LIGAMENTS Cruciates: ACL and PCL are intact. Collaterals: Medial collateral ligament is intact. Lateral collateral ligament complex is intact. CARTILAGE Patellofemoral:  No chondral defect. Medial:  No  chondral defect. Lateral:  No chondral defect. JOINT: No joint effusion. Normal Hoffa's fat-pad. No plical thickening. POPLITEAL FOSSA: Popliteus tendon is intact. No Baker's cyst. EXTENSOR MECHANISM: Intact quadriceps tendon. Intact patellar tendon. Intact lateral patellar retinaculum. Intact medial patellar retinaculum. Intact MPFL. BONES: No aggressive osseous lesion. No fracture or dislocation. Other: Large soft tissue wound along the lateral aspect of the knee with overlying bandaging material. Severe soft tissue edema along the anterior half of the knee. Edema in the popliteal fossa and superficial to the medial gastrocnemius muscle has resolved. Small pockets of fluid are present within the subcutaneous fat  along the anterior aspect of the proximal lower leg. Small foci of air are seen in the soft tissues along the lateral aspect of the knee adjacent to the cutaneous wound extending superiorly and inferiorly. Muscles are normal. IMPRESSION: 1. Large soft tissue wound along the lateral aspect of the knee with overlying bandaging material. Severe cellulitis along the anterior half of the knee. Small pockets of fluid are present within the subcutaneous fat along the anterior aspect of the proximal lower leg which may reflect more focal areas of cellulitis versus small abscesses. Soft tissue emphysema along the lateral aspect of the knee adjacent to the cutaneous wound. If there is further clinical concern recommend an MRI of the knee with intravenous contrast. 2. Edema in the popliteal fossa and superficial to the medial gastrocnemius muscle has resolved. Electronically Signed   By: Kathreen Devoid   On: 06/24/2020 15:15   MR KNEE RIGHT WO CONTRAST  Result Date: 06/21/2020 CLINICAL DATA:  IV drug abuser with right knee pain, redness and swelling. EXAM: MRI OF THE RIGHT KNEE WITHOUT CONTRAST TECHNIQUE: Multiplanar, multisequence MR imaging of the knee was performed. No intravenous contrast was administered.  COMPARISON:  Plain films right knee 06/21/2020. FINDINGS: Scattered skin ulcerations about the knee are seen. Extensive subcutaneous edema is present about the knee. Small foci signal dropout in the subcutaneous fatty tissues correlate with soft tissue gas seen on the prior exam. No focal fluid collection is identified. There is some fluid about the gastrocnemius muscle. No evidence of gas tracking along fascial planes is identified. MENISCI Medial meniscus:  Intact. Lateral meniscus:  Intact. LIGAMENTS Cruciates:  Intact. Collaterals:  Intact. CARTILAGE Patellofemoral:  Normal. Medial:  Normal. Lateral:  Normal. Joint:  Trace joint effusion. Popliteal Fossa: No Baker's cyst. There is edema in fat in the popliteal fossa. Extensor Mechanism:  Intact. Bones:  Normal marrow signal throughout. Other: None. IMPRESSION: Negative for osteomyelitis or septic joint. Findings most consistent with intense cellulitis about the knee. Edema in fat of the popliteal fossa is also worrisome for infection. There is some fluid along the superficial fascia of the gastrocnemius muscle suggestive of fasciitis. Negative for myositis or gas along fascial planes to suggest necrotizing fasciitis. Electronically Signed   By: Inge Rise M.D.   On: 06/21/2020 11:57    Subjective:   Patient was seen and examined 06/29/2020, 9:37 AM Patient stable today. No acute distress.  No issues overnight Stable for discharge.  Discharge Exam:    Vitals:   06/28/20 0517 06/28/20 1226 06/28/20 1945 06/29/20 0441  BP: 100/70 97/61 112/75 100/73  Pulse: 82 91 95 76  Resp: '18 15 18 18  ' Temp: 98.3 F (36.8 C) 98.1 F (36.7 C) 98.3 F (36.8 C) 97.7 F (36.5 C)  TempSrc: Oral Oral Oral Oral  SpO2: 100% 100% 100% 100%  Weight:      Height:        General: Pt lying comfortably in bed & appears in no obvious distress. Cardiovascular: S1 & S2 heard, RRR, S1/S2 +. No murmurs, rubs, gallops or clicks. No JVD or pedal edema. Respiratory:  Clear to auscultation without wheezing, rhonchi or crackles. No increased work of breathing. Abdominal:  Non-distended, non-tender & soft. No organomegaly or masses appreciated. Normal bowel sounds heard. CNS: Alert and oriented. No focal deficits. Extremities: no edema, no cyanosis    The results of significant diagnostics from this hospitalization (including imaging, microbiology, ancillary and laboratory) are listed below for reference.  Microbiology:   Recent Results (from the past 240 hour(s))  Culture, blood (Routine x 2)     Status: None   Collection Time: 06/20/20  4:07 PM   Specimen: BLOOD  Result Value Ref Range Status   Specimen Description BLOOD BLOOD RIGHT HAND  Final   Special Requests   Final    BOTTLES DRAWN AEROBIC AND ANAEROBIC Blood Culture results may not be optimal due to an inadequate volume of blood received in culture bottles   Culture   Final    NO GROWTH 5 DAYS Performed at [facility], [address], [place], [place] [address]    Report Status 06/25/2020 FINAL  Final  Culture, blood (Routine x 2)     Status: None   Collection Time: 06/20/20  9:39 PM   Specimen: BLOOD  Result Value Ref Range Status   Specimen Description BLOOD BLOOD LEFT FOREARM  Final   Special Requests   Final    BOTTLES DRAWN AEROBIC AND ANAEROBIC Blood Culture adequate volume   Culture   Final    NO GROWTH 5 DAYS Performed at [facility], [address] [place], [place] [address]    Report Status 06/26/2020 FINAL  Final  SARS Coronavirus 2 by RT PCR (hospital order, performed in [facility] hospital lab) Nasopharyngeal Nasopharyngeal Swab     Status: None   Collection Time: 06/20/20 10:18 PM   Specimen: Nasopharyngeal Swab  Result Value Ref Range Status   SARS Coronavirus 2 NEGATIVE NEGATIVE Final    Comment: (NOTE) SARS-CoV-2 target nucleic acids are NOT DETECTED.  The SARS-CoV-2 RNA is generally detectable in upper and lower respiratory  specimens during the acute phase of infection. The lowest concentration of SARS-CoV-2 viral copies this assay can detect is 250 copies / mL. A negative result does not preclude SARS-CoV-2 infection and should not be used as the sole basis for treatment or other patient management decisions.  A negative result may occur with improper specimen collection / handling, submission of specimen other than nasopharyngeal swab, presence of viral mutation(s) within the areas targeted by this assay, and inadequate number of viral copies (<250 copies / mL). A negative result must be combined with clinical observations, patient history, and epidemiological information.  Fact Sheet for Patients:   StrictlyIdeas.no  Fact Sheet for Healthcare Providers: BankingDealers.co.za  This test is not yet approved or  cleared by the [place] FDA and has been authorized for detection and/or diagnosis of SARS-CoV-2 by FDA under an Emergency Use Authorization (EUA).  This EUA will remain in effect (meaning this test can be used) for the duration of the COVID-19 declaration under Section 564(b)(1) of the Act, 21 U.S.C. section 360bbb-3(b)(1), unless the authorization is terminated or revoked sooner.  Performed at [facility], [address] [place], [place] [address]   Aerobic/Anaerobic Culture (surgical/deep wound)     Status: None   Collection Time: 06/21/20  4:08 PM   Specimen: Wound; Abscess  Result Value Ref Range Status   Specimen Description   Final    WOUND Performed at [facility], [address] [place], [place] [address]    Special Requests NO ANAEROBIC SWAB SENT  Final   Gram Stain   Final    RARE WBC PRESENT, PREDOMINANTLY PMN ABUNDANT GRAM POSITIVE COCCI ABUNDANT GRAM NEGATIVE RODS Performed at [facility], [place] [address] [place],  [address]    Culture   Final    FEW STAPHYLOCOCCUS AUREUS FEW  STREPTOCOCCUS ANGINOSIS Beta hemolytic streptococci are predictably susceptible to penicillin and other beta lactams. Susceptibility testing not routinely performed. RARE EIKENELLA CORRODENS Usually susceptible to penicillin and other beta lactam agents,quinolones,macrolides and tetracyclines. MIXED ANAEROBIC FLORA PRESENT.  CALL LAB IF FURTHER IID REQUIRED.    Report Status 06/26/2020 FINAL  Final   Organism ID, Bacteria STAPHYLOCOCCUS AUREUS  Final      Susceptibility   Staphylococcus aureus - MIC*    CIPROFLOXACIN <=0.5 SENSITIVE Sensitive     ERYTHROMYCIN <=0.25 SENSITIVE Sensitive     GENTAMICIN <=0.5 SENSITIVE Sensitive     OXACILLIN 0.5 SENSITIVE Sensitive     TETRACYCLINE <=1 SENSITIVE Sensitive     VANCOMYCIN <=0.5 SENSITIVE Sensitive     TRIMETH/SULFA <=10 SENSITIVE Sensitive     CLINDAMYCIN <=0.25 SENSITIVE Sensitive     RIFAMPIN <=0.5 SENSITIVE Sensitive     Inducible Clindamycin NEGATIVE Sensitive     * FEW STAPHYLOCOCCUS AUREUS  Anaerobic culture     Status: None (Preliminary result)   Collection Time: 06/25/20  1:11 PM   Specimen: PATH Other; Tissue  Result Value Ref Range Status   Specimen Description   Final    TISSUE DISTAL FEMUR Performed at [facility], [address], [place], [place] [address]    Special Requests   Final    NONE Performed at [facility], [address] [place], [place] [address]    Gram Stain   Final    NO WBC SEEN RARE Lonell Grandchild NEGATIVE RODS RARE GRAM POSITIVE COCCI    Culture   Final    HOLDING FOR POSSIBLE ANAEROBE Performed at [facility], [place] [address] [place], [place] [address]    Report Status PENDING  Incomplete  Anaerobic culture     Status: None (Preliminary result)   Collection Time: 06/25/20  1:13 PM   Specimen: PATH Other; Tissue  Result Value Ref Range Status   Specimen Description   Final    TISSUE PROXIMAL TIBIA Performed at [facility], [address], [place], [place]  [address]    Special Requests   Final    NONE Performed at [facility], [address] [place], [place] [address]    Gram Stain   Final    NO WBC SEEN MODERATE GRAM NEGATIVE RODS FEW GRAM POSITIVE COCCI RARE GRAM POSITIVE RODS    Culture   Final    HOLDING FOR POSSIBLE ANAEROBE Performed at [facility], [place] [address], [place], [place] [address]    Report Status PENDING  Incomplete  Anaerobic culture     Status: None (Preliminary result)   Collection Time: 06/25/20  1:15 PM   Specimen: PATH Other; Tissue  Result Value Ref Range Status   Specimen Description   Final    TISSUE MEDIAL PROXIMAL TIBIA Performed at [facility], [address] [place], [place] [address]    Special Requests   Final    NONE Performed at [facility], [address] [place], [place] [address]    Gram Stain   Final    NO WBC SEEN FEW GRAM NEGATIVE RODS RARE GRAM POSITIVE COCCI    Culture   Final    HOLDING FOR POSSIBLE ANAEROBE Performed at [facility], [place] [address], [place],  [address]    Report Status PENDING  Incomplete     Labs:   CBC: Recent Labs  Lab 06/23/20 0634 06/24/20 0500 06/26/20 0952 06/29/20 0640  WBC  11.7* 11.9* 10.6* 6.9  HGB 9.6* 9.9* 7.2* 8.7*  HCT 27.7* 29.5* 21.8* 25.1*  MCV 91.1 93.4 96.5 93.7  PLT 261 301 421* 110*   Basic Metabolic Panel: Recent Labs  Lab 06/23/20 0634 06/24/20 0500 06/25/20 0457 06/26/20 0952  NA 136 138 135 139  K 3.1* 3.3* 3.7 3.6  CL 104 104 102 105  CO2 '23 24 23 27  ' GLUCOSE 97 96 95 94  BUN '7 6 6 11  ' CREATININE 0.52 0.58 0.48 0.44  CALCIUM 7.9* 8.4* 8.4* 7.8*  MG  --   --  1.9  --    Liver Function Tests: No results for input(s): AST, ALT, ALKPHOS, BILITOT, PROT, ALBUMIN in the last 168 hours. BNP (last 3 results) No results for input(s): BNP in the last 8760 hours. Cardiac Enzymes: No results for input(s): CKTOTAL, CKMB, CKMBINDEX, TROPONINI in the last 168  hours. CBG: No results for input(s): GLUCAP in the last 168 hours. Hgb A1c No results for input(s): HGBA1C in the last 72 hours. Lipid Profile No results for input(s): CHOL, HDL, LDLCALC, TRIG, CHOLHDL, LDLDIRECT in the last 72 hours. Thyroid function studies No results for input(s): TSH, T4TOTAL, T3FREE, THYROIDAB in the last 72 hours.  Invalid input(s): FREET3 Anemia work up No results for input(s): VITAMINB12, FOLATE, FERRITIN, TIBC, IRON, RETICCTPCT in the last 72 hours. Urinalysis    Component Value Date/Time   COLORURINE Yellow 02/10/2013 1000   APPEARANCEUR Clear 02/10/2013 1000   LABSPEC 1.009 02/10/2013 1000   PHURINE 6.0 02/10/2013 1000   GLUCOSEU Negative 02/10/2013 1000   HGBUR 1+ 02/10/2013 1000   BILIRUBINUR Negative 02/10/2013 1000   KETONESUR Negative 02/10/2013 1000   PROTEINUR Negative 02/10/2013 1000   NITRITE Negative 02/10/2013 1000   LEUKOCYTESUR 1+ 02/10/2013 1000         Time coordinating discharge: Over 45 minutes  SIGNED: Deatra James, MD, FACP, FHM. [facility],  Please use [email or web address] to Page If 7PM-7AM, please contact night-coverage [email or web address] Ota [facility] 06/29/2020, 9:37 AM

## 2020-06-29 NOTE — TOC Transition Note (Addendum)
Transition of Care [facility]) - CM/SW Discharge Note   Patient Details  Name: Lori Pollard MRN: [id number] Date of Birth: 1991-05-07  [facility] CM/SW Contact:  Chapman Fitch, RN Phone Number: 06/29/2020, 3:17 PM   Clinical Narrative:    Patient to discharge today TOC has left voicemail at "[facility]".  Patient states that she has spoken to [facility] house and they will accept her today.  Bedside RN to educated patient on dressing changes, and provide supplies.  Patient to follow up with ortho in 2 weeks  Mother to pick up patient at discharge.  Will pick up medications after discharge  RW delivered to room   Patient is aware that she will not have home health services.  Patient declines for PCP appointment to be set up at this time    Final next level of care: Home/Self Care Barriers to Discharge: No Home Care Agency will accept this patient   Patient Goals and CMS Choice        Discharge Placement                       Discharge Plan and Services                                     Social Determinants of Health (SDOH) Interventions     Readmission Risk Interventions No flowsheet data found.

## 2020-06-29 NOTE — Progress Notes (Signed)
Mobility Specialist - Progress Note   06/29/20 1401  Mobility  Activity Transferred to/from BSC;Dangled on edge of bed;Stood at bedside (Performed heel slides, seated kicks, and standing high knees)  Level of Assistance Modified independent, requires aide device or extra time  Assistive Device Front wheel walker  Distance Ambulated (ft) 5 ft  Mobility Response Tolerated well  Mobility performed by Mobility specialist  $Mobility charge 1 Mobility    Post-mobility: 113 HR, 100% SpO2   Pt was long-sitting, eating lunch in bed upon arrival. Pt agreed to mobility session, and was needing to go to [facility]. Mobility specialist assisted pt transferring to/from [facility] w/ SBA. Pt able to complete personal hygiene independently and transfer back to bed mod. Independent. Pt dangled EOB, stating the pain in her knee was "not too bad" at the moment. Pt performed seat kicks, 10 reps/leg. Pt then progressed to S2S w/ RW and performed standing high knees (10 reps/leg) mod. Independent. Finally, pt laid back down in bed and performed heel slides, 10 reps/leg. Overall, pt tolerated session well. Pt interested in bringing a RW home to help w/ stability when ambulating at home. Nurse was notified. Pt left laying down in bed w/ call bell and phone in place of reach.   Azarel Banner Mobility Specialist  06/29/20, 2:15 PM

## 2020-07-01 LAB — ANAEROBIC CULTURE
Gram Stain: NONE SEEN
Gram Stain: NONE SEEN

## 2020-08-23 ENCOUNTER — Ambulatory Visit: Payer: Medicaid Other

## 2020-08-23 ENCOUNTER — Other Ambulatory Visit: Payer: Self-pay

## 2020-09-04 IMAGING — US US OB COMP +14 WK
1 series · 13 of 28 positions shown · non-contrast
Comparison: none

CLINICAL DATA: 28-year-old pregnant female with reported history of
oligohydramnios.

EXAM:
OBSTETRICAL ULTRASOUND >14 WKS

[Series 1: us ob comp +14 wk · 13 of 86 slices shown]
[im 4/86]
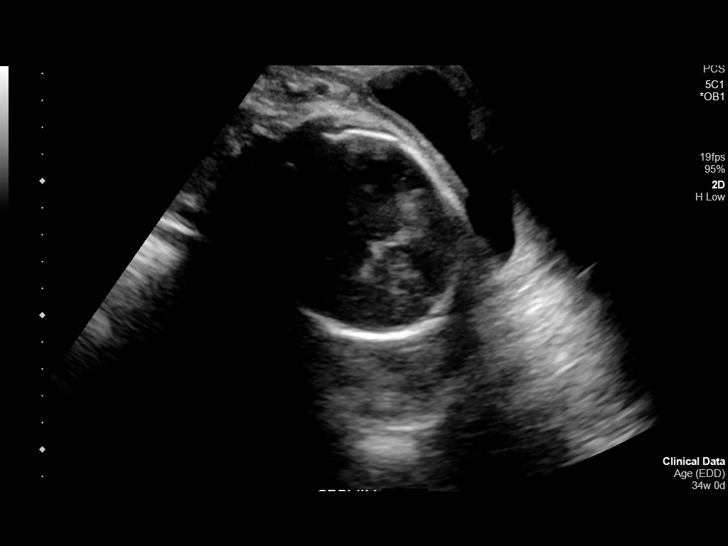
[im 10/86]
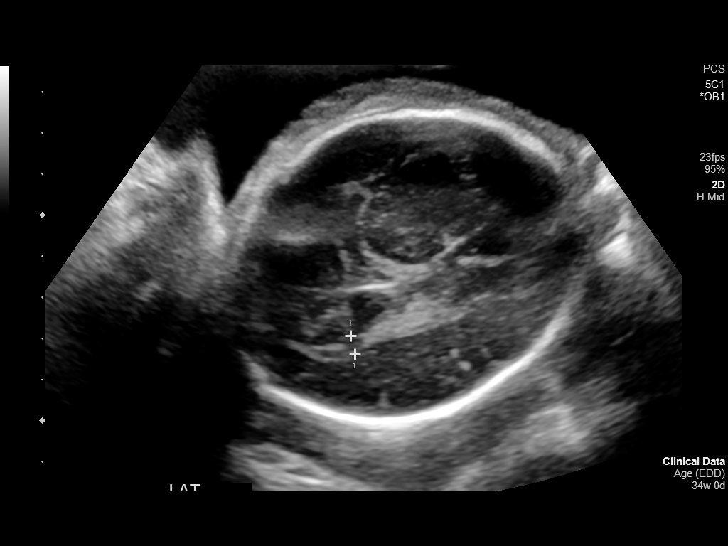
[im 16/86]
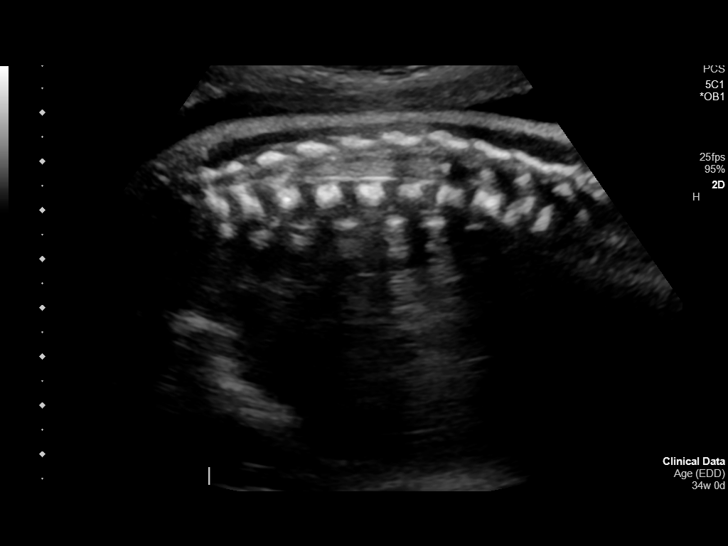
[im 23/86]
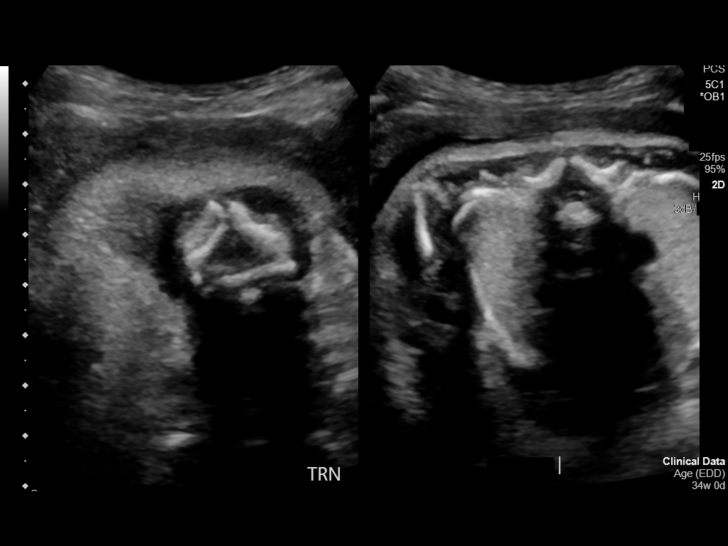
[im 29/86]
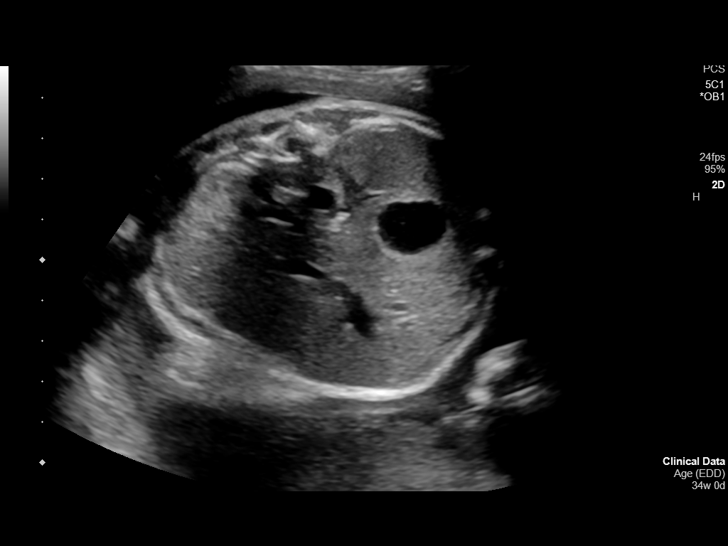
[im 35/86]
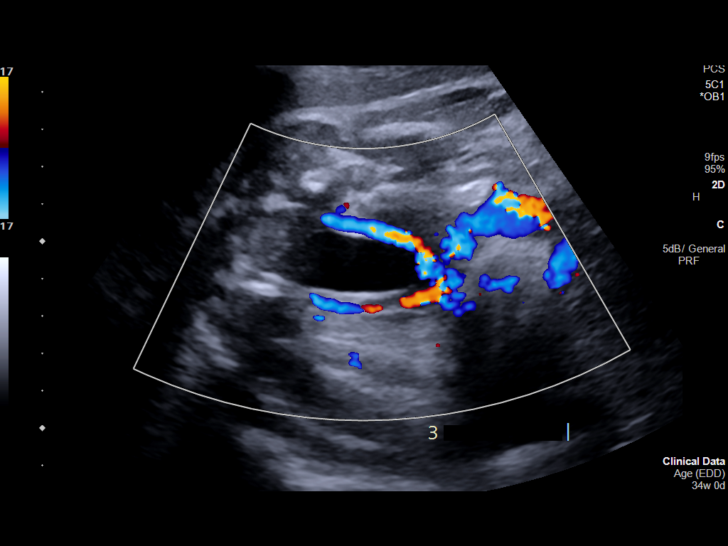
[im 45/86]
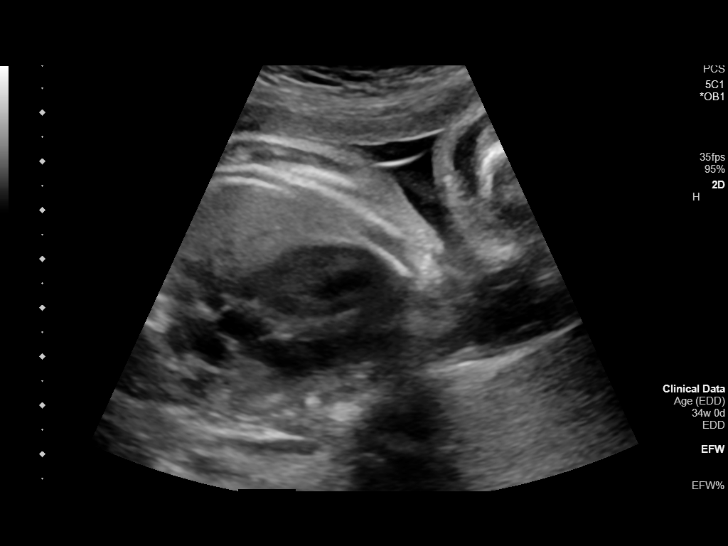
[im 51/86]
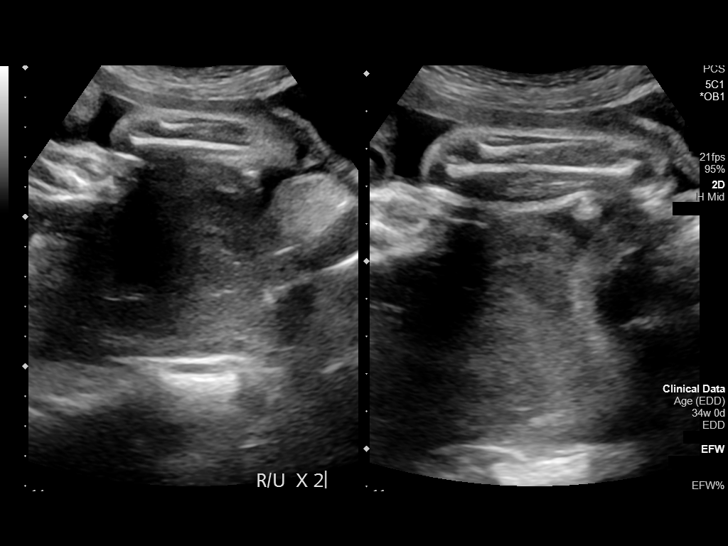
[im 57/86]
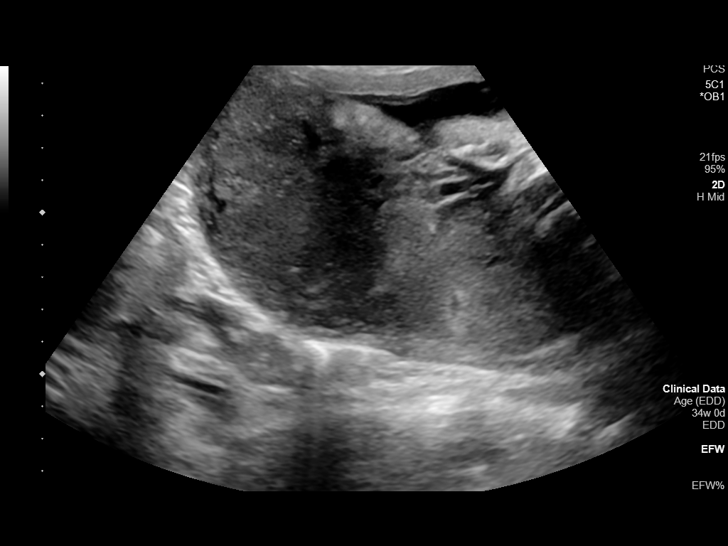
[im 63/86]
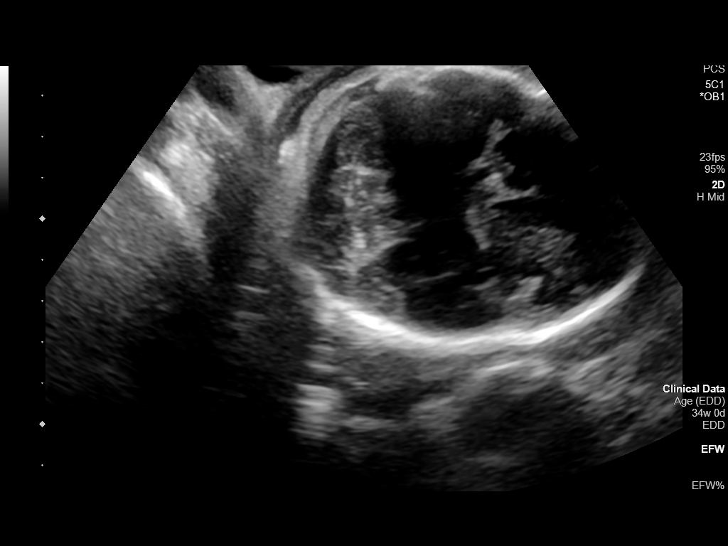
[im 70/86]
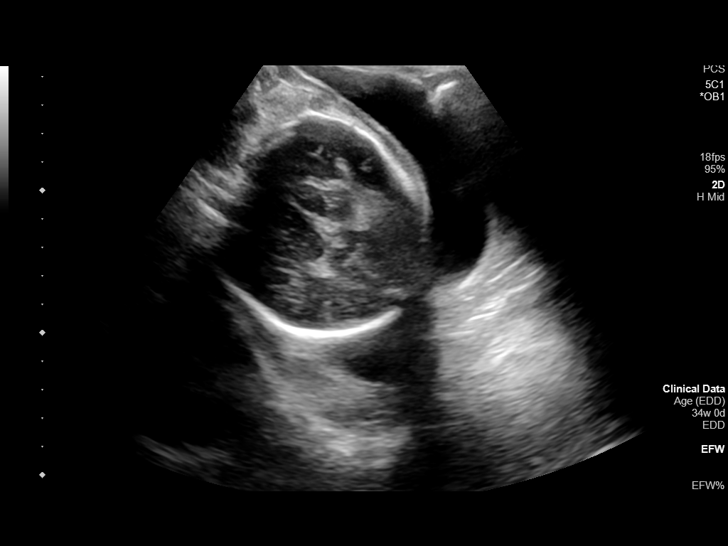
[im 76/86]
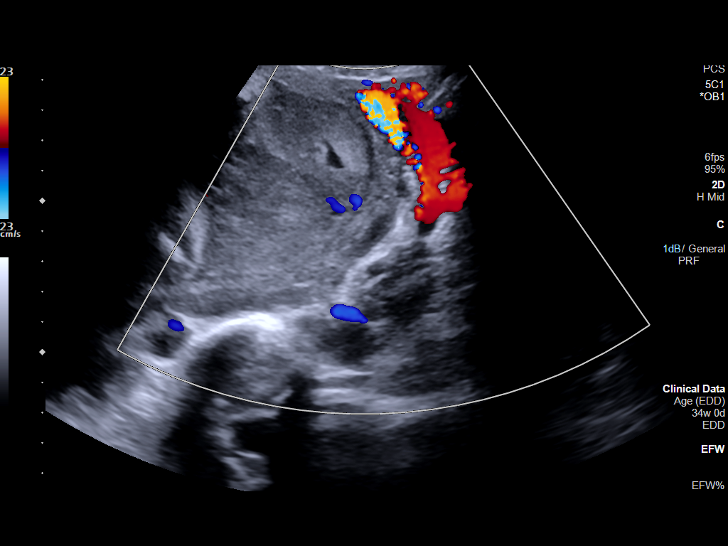
[im 82/86]
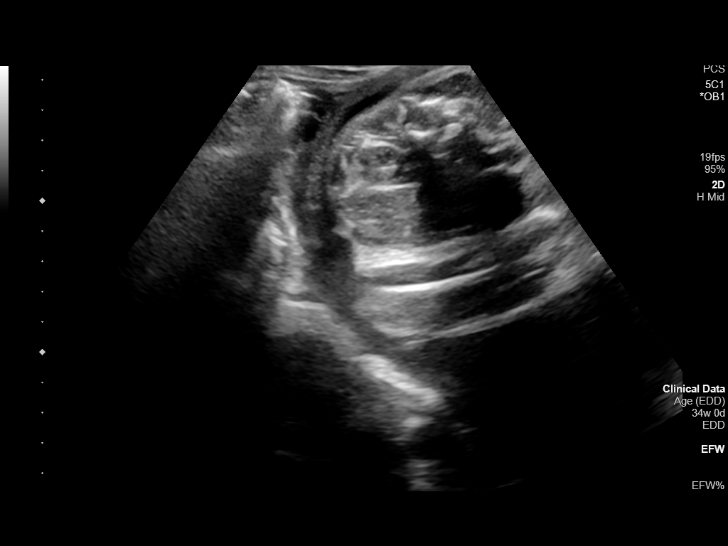

[13 of 28 positions shown; findings below may reference images not displayed]

FINDINGS: Number of Fetuses: 1

Heart Rate:  133 bpm

Movement: Yes

Presentation: Cephalic

Previa: No

Placental Location: Posterior

Amniotic Fluid (Subjective): Decreased

Amniotic Fluid (Objective):

Vertical pocket = 3.2cm

AFI = 7.7 cm (5%ile= 8.1 cm, 95%= 24.8 cm for 34 wks)

FETAL BIOMETRY

BPD: 7.0cm 28w 1d

HC:   26.8cm 29w 1d

AC:   25.8cm 30w 0d

FL:   5.8cm 30w 4d

Current Mean GA: 29w 4d US EDC: 01/13/2020

Assigned GA:  34w 0d Assigned EDC: 12/13/2019

Estimated Fetal Weight:  1,477g out of range (less than 3%ile)

FETAL ANATOMY

Lateral Ventricles: Appears normal

Thalami/CSP: Appears normal

Posterior Fossa:  Limited views appear normal

Nuchal Region: Limited views appear normal   NFT= N/A > 20 WKS

Upper Lip: Appears normal

Spine: Appears normal

4 Chamber Heart on Left: Appears normal

LVOT: Appears normal

RVOT: Appears normal on limited views

Stomach on Left: Appears normal

3 Vessel Cord: Appears normal

Cord Insertion site: Not well visualized

Kidneys: Appears normal

Bladder: Appears normal

Extremities: Limited views appear normal, 4 extremities demonstrated

Technically difficult due to: Fetal position and advanced
gestational age.

Maternal Findings:

Cervix: Not well visualized on these transabdominal views due to
fetal head position.
IMPRESSION: 1. Single living intrauterine gestation in cephalic lie at 29 weeks
4 days by average ultrasound age, compared to the expected
gestational age of 34 weeks 0 days by assigned dates. Estimated
fetal weight 1177 g, below the third percentile. Findings are
worrisome for IUGR. Additional testing such as umbilical artery
Doppler analysis may be considered as clinically warranted.
2. Oligohydramnios.  AFI 7.7.
3. Otherwise no fetal or maternal abnormalities detected.

These results will be called to the ordering clinician or
representative by the Radiologist Assistant, and communication
documented in the PACS or zVision Dashboard.

## 2020-09-05 ENCOUNTER — Ambulatory Visit: Payer: Medicaid Other

## 2020-09-28 ENCOUNTER — Other Ambulatory Visit: Payer: Self-pay

## 2020-09-28 ENCOUNTER — Ambulatory Visit: Payer: Medicaid Other

## 2020-09-28 ENCOUNTER — Encounter: Payer: Self-pay | Admitting: Advanced Practice Midwife

## 2020-09-28 ENCOUNTER — Ambulatory Visit (LOCAL_COMMUNITY_HEALTH_CENTER): Payer: Medicaid Other | Admitting: Advanced Practice Midwife

## 2020-09-28 VITALS — BP 120/86 | Ht 59.0 in | Wt 83.0 lb

## 2020-09-28 DIAGNOSIS — Z202 Contact with and (suspected) exposure to infections with a predominantly sexual mode of transmission: Secondary | ICD-10-CM

## 2020-09-28 DIAGNOSIS — Z3009 Encounter for other general counseling and advice on contraception: Secondary | ICD-10-CM

## 2020-09-28 DIAGNOSIS — F1110 Opioid abuse, uncomplicated: Secondary | ICD-10-CM

## 2020-09-28 DIAGNOSIS — Z3200 Encounter for pregnancy test, result unknown: Secondary | ICD-10-CM | POA: Diagnosis not present

## 2020-09-28 DIAGNOSIS — F1120 Opioid dependence, uncomplicated: Secondary | ICD-10-CM

## 2020-09-28 DIAGNOSIS — F1510 Other stimulant abuse, uncomplicated: Secondary | ICD-10-CM | POA: Insufficient documentation

## 2020-09-28 DIAGNOSIS — O99320 Drug use complicating pregnancy, unspecified trimester: Secondary | ICD-10-CM

## 2020-09-28 DIAGNOSIS — F5089 Other specified eating disorder: Secondary | ICD-10-CM | POA: Insufficient documentation

## 2020-09-28 DIAGNOSIS — F1410 Cocaine abuse, uncomplicated: Secondary | ICD-10-CM | POA: Insufficient documentation

## 2020-09-28 DIAGNOSIS — F17200 Nicotine dependence, unspecified, uncomplicated: Secondary | ICD-10-CM | POA: Insufficient documentation

## 2020-09-28 MED ORDER — CEFTRIAXONE SODIUM 500 MG IJ SOLR
500.0000 mg | Freq: Once | INTRAMUSCULAR | Status: AC
  Administered 2020-09-28: 500 mg via INTRAMUSCULAR

## 2020-09-28 MED ORDER — AZITHROMYCIN 500 MG PO TABS
1000.0000 mg | ORAL_TABLET | Freq: Once | ORAL | Status: AC
  Administered 2020-09-28: 1000 mg via ORAL

## 2020-09-28 NOTE — Progress Notes (Signed)
Pt reports she is here for physical and pap. Pt desires STD screening and reports found out that was in contact to Gonorrhea and Hep C. Pt reports unsure of when last period was but hasn't had a period since her daughter was born 10/2019. Pt reports not on birth control and not sure if would like to get on it at this time. Pt filling out PHQ9.

## 2020-09-28 NOTE — Progress Notes (Signed)
UPT is negative today. Wet mount reviewed and is negative, so no treatment needed for wet mount per standing order. Offered ECP per Hazle Coca, CNM verbal order but pt declines ECP at this time. [facility] brochure given to pt per pt request. Pt also accepts [employer] and Kathreen Cosier, LCSW cards with contact info. Lab was unable to get blood samples. Pt scheduled to return to care for physical and for blood work for 10/02/2020. Pt aware of appt date and time and to arrive early for check-in. Pt treated as a contact to Gonorrhea per E. Sciora, CNM verbal order with Ceftriaxone 500mg  IM and Azithromycin 1g po DOT. Pt tolerated well. Counseled pt on importance of staying 20 minutes post injection to monitor for side effects, pt states understanding, but only stayed for 10 minutes and left AMA. Provider orders completed.

## 2020-09-28 NOTE — Progress Notes (Signed)
[facility] DEPARTMENT [facility] [address] Main Number: [phone]    Family Planning Visit- Initial Visit  Subjective:  Lori Pollard is a 29 y.o. SWF [id number] (8, 6, 11 mo) smoker   being seen today for an initial well woman visit and to discuss family planning options.  She is currently using None for pregnancy prevention. Patient reports she does not want a pregnancy in the next year.  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  Chief Complaint  Patient presents with  . Contraception    Physical, pap  . SEXUALLY TRANSMITTED DISEASE    screening and contact    Patient reports boyfriend just got out of prison and has Hep C and she Had sex with another partner who has GC. Stopped using Suboxone 11/2019 (used to use it IV).  Last cocaine today smoked + IV.  Last heroin 4 mo ago.  Last meth 3 wks ago.  Hx cocaine IV.   Last acid 10 years ago.  Smoking cigs 1/2-1 ppd.  Last MJ 06/2020.  Last ETOH last night (1 beer) used to be daily but now 1x/wk.  Living with her unemployed boyfriend; not working, not in school..  Her 3 kids are living with FOB's sister who has "kinship" of the kids.  C/s 11/01/19 and baby born preterm and was in NICU x 1.5 mo.  Last  sex 09/25/20 without condom; with current partner x 14 years; 8 partners in last 3 mo.  Last pap 07/14/2019 neg.  Doesn't want birth control because plans to go to primary care MD for it.  States she was told she has schizophrenia, Bipolar, anxiety, PTSD but she is not sure if those are accurate because she was using drugs.  History suicide attempts x 3-4.  6 months ago overdosed on 13 Ambien and didn't go to hospital.  4 years ago overdosed on 15 Trazadone. 6 years ago tried to jump in front of a train.  Goes to therapy at [facility] and last seen 1.5 mo ago.  Ice pica daily     Patient denies birth control   Body mass index is 16.76 kg/m. - Patient is eligible for diabetes screening based on BMI and age >52?  no HA1C ordered? no  Patient reports 8 of partners in last year. Desires STI screening?  Yes  Has patient been screened once for HCV in the past?  No   Lab Results  Component Value Date   HCVAB NON REACTIVE 06/27/2020    Does the patient have current drug use (including MJ), have a partner with drug use, and/or has been incarcerated since last result? Yes  If yes-- Screen for HCV through [facility]   Does the patient meet criteria for HBV testing? Yes  Criteria:  -Household, sexual or needle sharing contact with HBV -History of drug use -HIV positive -Those with known Hep C   Health Maintenance Due  Topic Date Due  .  COVID-19 Vaccine (1) Never done  . INFLUENZA VACCINE  06/10/2020    Review of Systems  Neurological: Positive for dizziness (onset pp with birth of baby 10/2019 with LOC--referred to primary care MD) and headaches (4x/mo always right occipital relieved with rest, +N&V,+audio,+visual).  All other systems reviewed and are negative.   The following portions of the patient's history were reviewed and updated as appropriate: allergies, current medications, past family history, past medical history, past social history, past surgical history and  problem list. Problem list updated.   See flowsheet for other program required questions.  Objective:   Vitals:   09/28/20 1348  BP: 120/86  Weight: 83 lb (37.6 kg)  Height: 4\' 11"  (1.499 m)    Physical Exam  Pt refuses and states she "just can't have that today".  Pt very anxious and panicky. Pt agrees to self collect wet mount and GC/Chlamydia today and return for physical  Assessment and Plan:  Lori Pollard is a 29 y.o. female presenting to the [facility] Department for an initial well woman exam/family planning visit  Contraception counseling: Reviewed all forms of birth control options in the tiered based approach. available including abstinence; over the counter/barrier methods; hormonal contraceptive medication including pill, patch, ring, injection,contraceptive implant, ECP; hormonal and nonhormonal IUDs; permanent sterilization options including vasectomy and the various tubal sterilization modalities. Risks, benefits, and typical effectiveness rates were reviewed.  Questions were answered.  Written information was also given to the patient to review.  Patient desires nothing for birth control. She will follow up in prn for surveillance.  She was told to call with any further questions, or with any concerns about this method of contraception.  Emphasized use of condoms 100% of the time for STI prevention.  Patient was offered ECP. ECP was not accepted by the patient. ECP counseling was not given - see RN documentation  1. Possible pregnancy Neg - Pregnancy, urine  2. Family planning Please give [facility] brochure to pt Please reschedule physical with pt Treat as contact to [facility]  - HIV/HCV [facility] - Syphilis Serology, [facility] - HBV Antigen/Antibody State Lab  3. Smoker Counseled via 5 A's to stop smoking     No follow-ups on file.  No future appointments.  [facility], CNM

## 2020-09-29 LAB — WET PREP FOR TRICH, YEAST, CLUE
Trichomonas Exam: NEGATIVE
Yeast Exam: NEGATIVE

## 2020-09-29 LAB — PREGNANCY, URINE: Preg Test, Ur: NEGATIVE

## 2020-10-02 ENCOUNTER — Ambulatory Visit: Payer: Self-pay

## 2020-10-10 ENCOUNTER — Telehealth: Payer: Self-pay

## 2020-10-10 DIAGNOSIS — Z205 Contact with and (suspected) exposure to viral hepatitis: Secondary | ICD-10-CM | POA: Insufficient documentation

## 2020-10-10 NOTE — Telephone Encounter (Signed)
TC with patient.  Verified ID via password.  RN had just notified patient's partner of +Hep C lab.  States lab was unable to get her blood when she was here on 09/28/20 and she is sure she has it too.  RN also informed patient of negative Chlamydia and indeterminate GC.  Appt scheduled for 10/12/20 for HIV/RPR/Hep C testing and repeat GC/Chlamydia. Instructed to drink plenty of water before appt. Richmond Campbell, RN

## 2020-10-12 ENCOUNTER — Ambulatory Visit: Payer: Self-pay

## 2020-12-31 ENCOUNTER — Other Ambulatory Visit: Payer: Self-pay

## 2020-12-31 ENCOUNTER — Ambulatory Visit
Admission: EM | Admit: 2020-12-31 | Discharge: 2020-12-31 | Disposition: A | Discharge status: Home / Self Care | Payer: Medicaid Other | Attending: Family Medicine | Admitting: Family Medicine

## 2020-12-31 DIAGNOSIS — S025XXA Fracture of tooth (traumatic), initial encounter for closed fracture: Secondary | ICD-10-CM | POA: Diagnosis present

## 2020-12-31 DIAGNOSIS — L290 Pruritus ani: Secondary | ICD-10-CM | POA: Insufficient documentation

## 2020-12-31 DIAGNOSIS — N898 Other specified noninflammatory disorders of vagina: Secondary | ICD-10-CM | POA: Diagnosis present

## 2020-12-31 DIAGNOSIS — R21 Rash and other nonspecific skin eruption: Secondary | ICD-10-CM | POA: Diagnosis present

## 2020-12-31 LAB — POCT URINALYSIS DIP (MANUAL ENTRY)
Bilirubin, UA: NEGATIVE
Blood, UA: NEGATIVE
Glucose, UA: NEGATIVE mg/dL
Ketones, POC UA: NEGATIVE mg/dL
Leukocytes, UA: NEGATIVE
Nitrite, UA: NEGATIVE
Protein Ur, POC: NEGATIVE mg/dL
Spec Grav, UA: >=1.03 — AB (ref 1.010–1.025)
Urobilinogen, UA: 0.2 E.U./dL
pH, UA: 6 (ref 5.0–8.0)

## 2020-12-31 LAB — POCT URINE PREGNANCY: Preg Test, Ur: NEGATIVE

## 2020-12-31 MED ORDER — IVERMECTIN 3 MG PO TABS
3.0000 mg | ORAL_TABLET | Freq: Once | ORAL | 0 refills | Status: AC
Start: 2020-12-31 — End: 2020-12-31

## 2020-12-31 NOTE — ED Triage Notes (Addendum)
Pt c/o itching and rash to rectum for past 3 days, has observed "stringy things" coming from rectum and from sores on arms; and states her partner was recently exposed to pinworms while incarcerated.  Pt was dx with gonorrhea of the oral cavity several months ago, but she and her partner did not abstain from sexual relations for recommended 7 day period after they were treated. Also c/o vaginal discharge, frequent urination, lower abdominal pain.  Reports quit smoking cocaine/crack approx 3 weeks ago, has been picking at sores. Sore noted to right posterior buccal area of mouth adjacent to broken off molar with sharp edge.   Denies fever, n/v/d. Also reports that her fiance was recently dx with Hep C.

## 2020-12-31 NOTE — Discharge Instructions (Signed)
Take the medication as prescribed.  Treating for possible scabies and pinworms. Swab sent for STD screening Follow-up with dentist as planned.  You have a hole in your cheek from broken tooth Follow up as needed for continued or worsening symptoms

## 2021-01-01 ENCOUNTER — Telehealth (HOSPITAL_COMMUNITY): Payer: Self-pay | Admitting: Emergency Medicine

## 2021-01-01 LAB — CERVICOVAGINAL ANCILLARY ONLY
Bacterial Vaginitis (gardnerella): NEGATIVE
Candida Glabrata: NEGATIVE
Candida Vaginitis: POSITIVE — AB
Chlamydia: NEGATIVE
Comment: NEGATIVE
Comment: NEGATIVE
Comment: NEGATIVE
Comment: NEGATIVE
Comment: NEGATIVE
Comment: NORMAL
Neisseria Gonorrhea: NEGATIVE
Trichomonas: NEGATIVE

## 2021-01-01 MED ORDER — FLUCONAZOLE 150 MG PO TABS: 150.0000 mg | ORAL_TABLET | Freq: Once | ORAL | 0 refills | Status: AC

## 2021-01-01 NOTE — ED Provider Notes (Signed)
[name]    CSN: [id number] Arrival date & time: 12/31/20  1023      History   Chief Complaint Chief Complaint  Patient presents with  . Rash  . Mouth Lesions    HPI Lori Pollard is a 30 y.o. female.   Patient is a 30 year old female who presents today with multiple complaints.  First complaint being itching and irritation to rectal area.  Observed "stringy things" coming from rectal area and sores on her arms and legs.  Recently exposed to pinworms due to spouse being incarcerated.  Tried OTC pin worm medication without an change. Patient also has sore on the inside of her mouth and broken tooth. Has an appointment to see a dentist.  Treated for gonorrhea several months ago of the oral area. No sore throat. She has also had some abnormal vaginal discharge, frequent urination and lower abdominal discomfort. Patient recently quit smoking crack/cocaine.  No fevers, chills, nausea, vomiting or diarrhea.   Rash Mouth Lesions Associated symptoms: rash     Past Medical History:  Diagnosis Date  . Anemia   . Cervicalgia   . History of chickenpox   . Hives   . Mental disorder     Patient Active Problem List   Diagnosis Date Noted  . Contact with and (suspected) exposure to viral hepatitis 10/10/2020  . Smoker 1 ppd 09/28/2020  . Pica ice daily 09/28/2020  . Heroin abuse (HCC) 09/28/2020  . Methamphetamine abuse (HCC) 09/28/2020  . Cocaine abuse (HCC) 09/28/2020  . Malnutrition of moderate degree 06/21/2020  . Cellulitis 06/20/2020  . Sepsis (HCC) 06/20/2020  . Chest pain 06/20/2020  . Polysubstance abuse (HCC) 06/20/2020  . Status post cesarean delivery 11/03/2019  . Vaginal bleeding in pregnancy 11/01/2019  . 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 History of cesarean delivery 07/14/2019  . Suboxone maintenance treatment complicating pregnancy, antepartum (HCC) 07/14/2019  .  Late prenatal care affecting pregnancy in second trimester 07/14/2019  . Anemia during pregnancy in second trimester 07/14/2019  . Bipolar disorder (HCC) 09/18/2015  . PTSD (post-traumatic stress disorder) 09/18/2015  . RAD (reactive airway disease) with wheezing 09/18/2015  . History of narcotic addiction (HCC) 09/18/2015  . Constipation due to pain medication therapy 09/18/2015  . Cervical pain 02/28/2009    Past Surgical History:  Procedure Laterality Date  . CESAREAN SECTION  2013  . CESAREAN SECTION  2015  . CESAREAN SECTION N/A 11/01/2019   Procedure: CESAREAN SECTION;  Surgeon: Natale Milch, MD;  Location: [facility] ORS;  Service: Obstetrics;  Laterality: N/A;  . INCISION AND DRAINAGE OF WOUND Right 06/25/2020   Procedure: IRRIGATION AND DEBRIDEMENT WOUND-Right Leg;  Surgeon: Signa Kell, MD;  Location: [facility] ORS;  Service: Orthopedics;  Laterality: Right;    OB History    Gravida  6   Para  3   Term  2   Preterm  1   AB  3   Living  3     SAB  2   IAB  1   Ectopic      Multiple  0   Live Births  3            Home Medications    Prior to Admission medications   Medication Sig Start Date End Date Taking? Authorizing Provider  ibuprofen (ADVIL) 200 MG tablet Take 200 mg by mouth every 6 (six) hours as needed. Pt reports taking as needed  [provider]    Family History Family History  Problem Relation Age of Onset  . Alcohol abuse Mother   . GER disease Mother   . Depression Mother   . Alcohol abuse Father   . Emphysema Father   . Diabetes Maternal Grandmother   . Heart disease Maternal Grandmother   . Hyperlipidemia Maternal Grandmother   . Hypertension Maternal Grandmother   . Kidney disease Maternal Grandmother   . Lung cancer Maternal Grandmother     Social History Social History   Tobacco Use  . Smoking status: Current Every Day Smoker    Packs/day: 0.50    Years: 11.00    Pack years: 5.50    Types: Cigarettes     Start date: 09/17/2004  . Smokeless tobacco: Never Used  Vaping Use  . Vaping Use: Never used  Substance Use Topics  . Alcohol use: No    Alcohol/week: 0.0 standard drinks  . Drug use: Yes    Types: Cocaine     Allergies   Patient has no known allergies.   Review of Systems Review of Systems  HENT: Positive for mouth sores.   Skin: Positive for rash.     Physical Exam Triage Vital Signs ED Triage Vitals  Enc Vitals Group     BP 12/31/20 1040 105/72     Pulse Rate 12/31/20 1040 (!) 101     Resp 12/31/20 1040 16     Temp 12/31/20 1040 98.1 F (36.7 C)     Temp Source 12/31/20 1040 Oral     SpO2 12/31/20 1040 97 %     Weight --      Height --      Head Circumference --      Peak Flow --      Pain Score 12/31/20 1052 5     Pain Loc --      Pain Edu? --      Excl. in GC? --    No data found.  Updated Vital Signs BP 105/72 (BP Location: Left Arm)   Pulse (!) 101   Temp 98.1 F (36.7 C) (Oral)   Resp 16   LMP 11/28/2020 (Approximate)   SpO2 97%   Visual Acuity Right Eye Distance:   Left Eye Distance:   Bilateral Distance:    Right Eye Near:   Left Eye Near:    Bilateral Near:     Physical Exam Vitals and nursing note reviewed.  Constitutional:      General: She is not in acute distress.    Appearance: Normal appearance. She is not ill-appearing, toxic-appearing or diaphoretic.  HENT:     Head: Normocephalic.     Nose: Nose normal.     Mouth/Throat:     Pharynx: Oropharynx is clear.      Comments: Broken tooth cutting into the buccal mucosa. Multiple missing teeth, dental caries, broken teeth.  Eyes:     Conjunctiva/sclera: Conjunctivae normal.  Pulmonary:     Effort: Pulmonary effort is normal.  Abdominal:     Palpations: Abdomen is soft.     Tenderness: There is no abdominal tenderness.  Genitourinary:    Comments: Deferred  Musculoskeletal:        General: Normal range of motion.     Cervical back: Normal range of motion.  Skin:     General: Skin is warm and dry.     Findings: No rash.     Comments: Multiple sores and scabs to the right forearm.  Sore to RLE.  No redness, swelling, discharge.   Neurological:     Mental Status: She is alert.  Psychiatric:        Mood and Affect: Mood normal.      UC Treatments / Results  Labs (all labs ordered are listed, but only abnormal results are displayed) Labs Reviewed  POCT URINALYSIS DIP (MANUAL ENTRY) - Abnormal; Notable for the following components:      Result Value   Spec Grav, UA >=1.030 (*)    All other components within normal limits  POCT URINE PREGNANCY  CERVICOVAGINAL ANCILLARY ONLY    EKG   Radiology No results found.  Procedures Procedures (including critical care time)  Medications Ordered in UC Medications - No data to display  Initial Impression / Assessment and Plan / UC Course  I have reviewed the triage vital signs and the nursing notes.  Pertinent labs & imaging results that were available during my care of the patient were reviewed by me and considered in my medical decision making (see chart for details).     Rectal itching Concern for pin worms.  Will treat with ivermectin since the OTC medicine did not seem to work.   Vaginal discharge Some concern for STD Sending swab for STD screening.   Dental caries and dental fracture.  Pt has appointment to see dentist.  No concerns for dental infection.    Final Clinical Impressions(s) / UC Diagnoses   Final diagnoses:  Rectal itching  Rash and nonspecific skin eruption  Vaginal discharge  Closed fracture of tooth, initial encounter     Discharge Instructions     Take the medication as prescribed.  Treating for possible scabies and pinworms. Swab sent for STD screening Follow-up with dentist as planned.  You have a hole in your cheek from broken tooth Follow up as needed for continued or worsening symptoms     ED Prescriptions    Medication Sig Dispense Auth.  Provider   ivermectin (STROMECTOL) 3 MG TABS tablet Take 1 tablet (3 mg total) by mouth once for 1 dose. Take 3 tablets at once on empty stomach with full glass of water. 3 tablet Dahlia Byes A, NP     PDMP not reviewed this encounter.   Janace Aris, NP 01/01/21 [phone]

## 2021-01-01 NOTE — Addendum Note (Signed)
Addended by: Heywood Bene on: 01/01/2021 02:32 PM   Modules accepted: Orders

## 2021-01-11 ENCOUNTER — Ambulatory Visit: Payer: Self-pay | Admitting: *Deleted

## 2021-01-11 NOTE — Telephone Encounter (Signed)
  Reason for Disposition . [1] Pinworms NOT Seen AND [2] rectal itching AND [3] EXPOSURE to pinworms  Answer Assessment - Initial Assessment Questions 1. PRESENCE OF PINWORMS: Have you seen any pinworms? If Yes, ask: "What do the worms look like? How long are the worms?" (Pinworms are very thin thread-like worms about the length of a staple.     Patient thinks so. Reports hairlike sticking out of her arm and different sizes 2. SYMPTOMS: "Do you have any other symptoms?" (e.g., anal itching, anal redness)     Left forearm itching and anal itching  3. ONSET: "When did you first notice symptoms or pinworms?"     Not sure  4. EXPOSURE: "Have you been exposed to someone who has pinworms?" (e.g., family member, intimate partner).     Boyfriend out of jail in October and had an outbreak in jail 5. PREGNANCY: "Is there any chance you are pregnant?" "When was your last menstrual period?"     na  Protocols used: [facility]

## 2021-01-11 NOTE — Telephone Encounter (Signed)
Summary: Advice    Pt stated she has a really bad rash, also is having bloody stools and is very concerned. Please advise.      Call to patient- she states she is driving and her phone is dying- she asked to be called back in 5-10 minutes

## 2021-01-11 NOTE — Telephone Encounter (Signed)
Pt stated she has a really bad rash, also is having bloody stools and is very concerned. Please advise  Called patient to review c/o of rash and bloody stools. Patient reports rash to left forearm is worse and has scratched area in manic episode and scratching in her sleep and noted something "hairlike" coming out of her arm rash. Patient was treated for possible pinworms with ivermectin and feels she needs another dose. Denies fever, spreading rash. C/o being constipated 2 days ago and noted blood in stool. LBM yesterday and reports loose bm and c/o rectal itching. Patient reports she has recently stopped using drugs , crack/ cocaine and thought her rash and itching was due to withdrawals. Instructed patient if she needs reassessment or has any additional blood in stools to go to UC or ED until her  First OV as new patient on 01/16/21.  Care advise given. Patient verbalized understanding of care advise and to call back or go to [facility] or ED if symptoms worsen.

## 2021-01-14 NOTE — Telephone Encounter (Signed)
No medical advice to give without her being a patient and receiving assessment. She should seek urgent care or emergent care until she is established here.

## 2021-01-14 NOTE — Telephone Encounter (Signed)
Called patient and no answer, LVMTRC to be advised. If patient calls back okay for PEC to advise.

## 2021-01-16 ENCOUNTER — Ambulatory Visit: Payer: Medicaid Other | Admitting: Physician Assistant

## 2021-01-18 ENCOUNTER — Telehealth: Payer: Self-pay | Admitting: Family Medicine

## 2021-01-18 MED ORDER — ALBENDAZOLE 200 MG PO TABS: 400.0000 mg | ORAL_TABLET | Freq: Every day | ORAL | 0 refills | Status: AC

## 2021-01-18 NOTE — Telephone Encounter (Signed)
Treating for pin worms.

## 2021-02-01 ENCOUNTER — Other Ambulatory Visit: Payer: Self-pay

## 2021-02-01 ENCOUNTER — Encounter: Payer: Self-pay | Admitting: Emergency Medicine

## 2021-02-01 ENCOUNTER — Ambulatory Visit
Admission: EM | Admit: 2021-02-01 | Discharge: 2021-02-01 | Disposition: A | Discharge status: Home / Self Care | Payer: Medicaid Other | Attending: Family Medicine | Admitting: Family Medicine

## 2021-02-01 DIAGNOSIS — R21 Rash and other nonspecific skin eruption: Secondary | ICD-10-CM

## 2021-02-01 DIAGNOSIS — K047 Periapical abscess without sinus: Secondary | ICD-10-CM

## 2021-02-01 DIAGNOSIS — T148XXA Other injury of unspecified body region, initial encounter: Secondary | ICD-10-CM

## 2021-02-01 MED ORDER — AMOXICILLIN-POT CLAVULANATE 875-125 MG PO TABS: 1.0000 | ORAL_TABLET | Freq: Two times a day (BID) | ORAL | 0 refills | Status: DC

## 2021-02-01 MED ORDER — PERMETHRIN 5 % EX CREA: TOPICAL_CREAM | CUTANEOUS | 1 refills | Status: DC

## 2021-02-01 NOTE — ED Triage Notes (Signed)
Patient c/o generalized rash x 1.5 months.   Patient endorses coming to clinic for treatment of similar symptoms.   Patient endorses itching.   Patient endorses "stuff coming out of skin".   Patient states " boyfriend just got out of jail was treated for pinworms".   Patient states she was previously prescribe "a antibiotic".

## 2021-02-01 NOTE — Discharge Instructions (Addendum)
Use the cream as prescribed.  Apply from the neck down and leave on for at least 12 hours.  You can then wash off.  Repeat this in 2 weeks if needed.  Wash all sheets and clothing in hot water.  Try not to pick the open areas and sores on your arms. Amoxicillin for dental infection. Follow up as needed for continued or worsening symptoms

## 2021-02-04 NOTE — ED Provider Notes (Signed)
Lori Pollard    CSN: [id number] Arrival date & time: 02/01/21  1543      History   Chief Complaint Chief Complaint  Patient presents with  . Rash    HPI Lori Pollard is a 30 y.o. female.   Pt is a 30 year old female that presents with rash.  This is been a persistent issue for the past month or so.  The rash is very itchy.  She has been treated twice for possible scabies and pinworms.  Multiple wounds on her arms from scratching and has sensation that something "hair like" is coming out of her skin. Also concern for possible infected right lower molar.  Dental pain with, gingival swelling.  No fever or trismus.   Rash   Past Medical History:  Diagnosis Date  . Anemia   . Cervicalgia   . History of chickenpox   . Hives   . Mental disorder     Patient Active Problem List   Diagnosis Date Noted  . Contact with and (suspected) exposure to viral hepatitis 10/10/2020  . Smoker 1 ppd 09/28/2020  . Pica ice daily 09/28/2020  . Heroin abuse (HCC) 09/28/2020  . Methamphetamine abuse (HCC) 09/28/2020  . Cocaine abuse (HCC) 09/28/2020  . Malnutrition of moderate degree 06/21/2020  . Cellulitis 06/20/2020  . Sepsis (HCC) 06/20/2020  . Chest pain 06/20/2020  . Polysubstance abuse (HCC) 06/20/2020  . Status post cesarean delivery 11/03/2019  . Vaginal bleeding in pregnancy 11/01/2019  . 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 History of cesarean delivery 07/14/2019  . Suboxone maintenance treatment complicating pregnancy, antepartum (HCC) 07/14/2019  . Late prenatal care affecting pregnancy in second trimester 07/14/2019  . Anemia during pregnancy in second trimester 07/14/2019  . Bipolar disorder (HCC) 09/18/2015  . PTSD (post-traumatic stress disorder) 09/18/2015  . RAD (reactive airway disease) with wheezing 09/18/2015  . History of narcotic addiction (HCC) 09/18/2015  .  Constipation due to pain medication therapy 09/18/2015  . Cervical pain 02/28/2009    Past Surgical History:  Procedure Laterality Date  . CESAREAN SECTION  2013  . CESAREAN SECTION  2015  . CESAREAN SECTION N/A 11/01/2019   Procedure: CESAREAN SECTION;  Surgeon: Natale Milch, MD;  Location: [facility] ORS;  Service: Obstetrics;  Laterality: N/A;  . INCISION AND DRAINAGE OF WOUND Right 06/25/2020   Procedure: IRRIGATION AND DEBRIDEMENT WOUND-Right Leg;  Surgeon: Signa Kell, MD;  Location: [facility] ORS;  Service: Orthopedics;  Laterality: Right;    OB History    Gravida  6   Para  3   Term  2   Preterm  1   AB  3   Living  3     SAB  2   IAB  1   Ectopic      Multiple  0   Live Births  3            Home Medications    Prior to Admission medications   Medication Sig Start Date End Date Taking? Authorizing Provider  amoxicillin-clavulanate (AUGMENTIN) 875-125 MG tablet Take 1 tablet by mouth every 12 (twelve) hours. 02/01/21  Yes Dimitri Dsouza A, NP  permethrin (ELIMITE) 5 % cream Apply from neck down. Leave on for 12 hours and then wash off. Repeat in 2 weeks if needed. 02/01/21  Yes Carmencita Cusic A, NP  ibuprofen (ADVIL) 200 MG tablet Take 200 mg by mouth every 6 (six) hours  as needed. Pt reports taking as needed    [provider]    Family History Family History  Problem Relation Age of Onset  . Alcohol abuse Mother   . GER disease Mother   . Depression Mother   . Alcohol abuse Father   . Emphysema Father   . Diabetes Maternal Grandmother   . Heart disease Maternal Grandmother   . Hyperlipidemia Maternal Grandmother   . Hypertension Maternal Grandmother   . Kidney disease Maternal Grandmother   . Lung cancer Maternal Grandmother     Social History Social History   Tobacco Use  . Smoking status: Current Every Day Smoker    Packs/day: 0.50    Years: 11.00    Pack years: 5.50    Types: Cigarettes    Start date: 09/17/2004  . Smokeless  tobacco: Never Used  Vaping Use  . Vaping Use: Never used  Substance Use Topics  . Alcohol use: No    Alcohol/week: 0.0 standard drinks  . Drug use: Yes    Types: Cocaine     Allergies   Patient has no known allergies.   Review of Systems Review of Systems  Skin: Positive for rash.     Physical Exam Triage Vital Signs ED Triage Vitals  Enc Vitals Group     BP 02/01/21 1555 106/71     Pulse Rate 02/01/21 1555 80     Resp 02/01/21 1555 14     Temp 02/01/21 1555 98 F (36.7 C)     Temp Source 02/01/21 1555 Oral     SpO2 02/01/21 1555 98 %     Weight --      Height --      Head Circumference --      Peak Flow --      Pain Score 02/01/21 1554 10     Pain Loc --      Pain Edu? --      Excl. in GC? --    No data found.  Updated Vital Signs BP 106/71 (BP Location: Left Arm)   Pulse 80   Temp 98 F (36.7 C) (Oral)   Resp 14   LMP 01/29/2021   SpO2 98%   Visual Acuity Right Eye Distance:   Left Eye Distance:   Bilateral Distance:    Right Eye Near:   Left Eye Near:    Bilateral Near:     Physical Exam Vitals and nursing note reviewed.  Constitutional:      General: She is not in acute distress.    Appearance: Normal appearance. She is not ill-appearing, toxic-appearing or diaphoretic.  HENT:     Head: Normocephalic.     Mouth/Throat:     Dentition: Gingival swelling and dental caries present. No dental abscesses.   Eyes:     Conjunctiva/sclera: Conjunctivae normal.  Pulmonary:     Effort: Pulmonary effort is normal.  Musculoskeletal:        General: Normal range of motion.     Cervical back: Normal range of motion.  Skin:    General: Skin is warm and dry.     Findings: No rash.     Comments: multiple areas on bilateral arms with sores, scabs, papules.    Neurological:     Mental Status: She is alert.  Psychiatric:        Mood and Affect: Mood normal.      UC Treatments / Results  Labs (all labs ordered are listed, but only abnormal  results are displayed) Labs Reviewed - No data to display  EKG   Radiology No results found.  Procedures Procedures (including critical care time)  Medications Ordered in UC Medications - No data to display  Initial Impression / Assessment and Plan / UC Course  I have reviewed the triage vital signs and the nursing notes.  Pertinent labs & imaging results that were available during my care of the patient were reviewed by me and considered in my medical decision making (see chart for details).     Rash- will treat for the last time for scabies, pt is convinced of this.  Treating with Elimite cream. Pt has mulitple open wounds and scabs from picking on her arms. Convinced that she has "worms" or "hairlike things" coming out of her skin.   Dental pain-treating for dental infection with amoxicillin Final Clinical Impressions(s) / UC Diagnoses   Final diagnoses:  Rash and nonspecific skin eruption  Dental infection  Multiple wounds of skin     Discharge Instructions     Use the cream as prescribed.  Apply from the neck down and leave on for at least 12 hours.  You can then wash off.  Repeat this in 2 weeks if needed.  Wash all sheets and clothing in hot water.  Try not to pick the open areas and sores on your arms. Amoxicillin for dental infection. Follow up as needed for continued or worsening symptoms     ED Prescriptions    Medication Sig Dispense Auth. Provider   amoxicillin-clavulanate (AUGMENTIN) 875-125 MG tablet Take 1 tablet by mouth every 12 (twelve) hours. 14 tablet Thula Stewart A, NP   permethrin (ELIMITE) 5 % cream Apply from neck down. Leave on for 12 hours and then wash off. Repeat in 2 weeks if needed. 60 g Dahlia Byes A, NP     PDMP not reviewed this encounter.   Janace Aris, NP 02/04/21 [phone]

## 2021-02-07 ENCOUNTER — Emergency Department
Admission: EM | Admit: 2021-02-07 | Discharge: 2021-02-07 | Disposition: A | Discharge status: Home / Self Care | Payer: Medicaid Other | Attending: Emergency Medicine | Admitting: Emergency Medicine

## 2021-02-07 ENCOUNTER — Other Ambulatory Visit: Payer: Self-pay

## 2021-02-07 DIAGNOSIS — F17210 Nicotine dependence, cigarettes, uncomplicated: Secondary | ICD-10-CM | POA: Diagnosis not present

## 2021-02-07 DIAGNOSIS — R21 Rash and other nonspecific skin eruption: Secondary | ICD-10-CM | POA: Diagnosis present

## 2021-02-07 DIAGNOSIS — K047 Periapical abscess without sinus: Secondary | ICD-10-CM | POA: Diagnosis not present

## 2021-02-07 DIAGNOSIS — R441 Visual hallucinations: Secondary | ICD-10-CM | POA: Insufficient documentation

## 2021-02-07 DIAGNOSIS — R440 Auditory hallucinations: Secondary | ICD-10-CM | POA: Insufficient documentation

## 2021-02-07 NOTE — ED Notes (Signed)
See detailed triage note.   Pt has itchy rash on both arms, hands, legs with extensive excoriation from scratching. Since October 2021. Pt believes she could have scabies. Partner was in jail and inmates were being treated for scabies but he had refused. Partner does not have itching or rash.   Pt used permethrin cream 1 week ago but so far, no relief.  Pt has 1.5cm round lesion below R knee, appears to be ulceration. This lesion is not itchy and started after treatment for R leg cellulitis with abscess that was drained.   Pt also has lesion in back of R side mouth, between tooth and cheek.  Pt also states has been having delusions "that I am dead" and auditory hallucinations (not command hallucinations) "like TV is talking to me" since October 2021.  Uses cocaine daily (since years). Denies meth and alcohol use at this time.  States did use meth and herion regularly (for about 6 months), until October 2021.  No thoughts of SI or HI at this time. Pt is alert and oriented in NAD at this time.

## 2021-02-07 NOTE — ED Provider Notes (Signed)
[facility] Emergency Department Provider Note  ____________________________________________  Time seen: Approximately 7:06 PM  I have reviewed the triage vital signs and the nursing notes.   HISTORY  Chief Complaint Rash    HPI Lori Pollard is a 30 y.o. female who presented to the emergency department with multiple medical complaints.  Patient is reporting multiple sites of rash, lesions to the skin.  She is also endorsing a possible dental infection.  In addition to these complaints, patient is endorsing visual and audible hallucinations.  Patient states that she has had this rash for several months.  She states that it started in August or October of last year.  She states that she has a history of using meth, heroin, cocaine.  She states that she stopped these last year with the exception of cocaine which is still a daily use.  She states that since then she has developed these lesions to her arms.  She states that they are very pruritic in nature.  She understands that sometimes she will take at her skin to the point of opening them into wounds.  In addition to the pruritus and subsequent excoriation, patient states that several times she has had hallucinations where she thought she was turning into a warm.  She has a deep excoriation to the left elbow that she states was 1 of these incidences.  She states that she thought she was turning into warm, as she was evaluating her body she thought her elbow was turning into a warm and so she picked up a rock and dog at her elbow until she thought she had killed the worm.  Patient realizes that these are visual hallucinations.  In fact while in the room, patient states all things flying about the room.  She states that she knows that "you cannot see them but I can see them. "  Patient states that she believes that she has either pinworms in her skin or scabies.  She states that her husband was incarcerated and has  been exposed to pinworms and scabies in jail.  Patient has tried multiple rounds of medication with no success in reducing her rash.  Patient is also endorsing right sided dental infection.  She states that she is already been evaluated for this, is on an antibiotic currently.  She states that she believes it is getting better but wanted to "mention it while I was here."  She denies any difficulty breathing or swallowing.  No trismus.  No fevers or chills.  Patient cannot remember the antibiotic that she is on but she is currently on an antibiotic for same.  Patient adamantly denies any suicidal or homicidal ideation, states that she knows that "I have a problem" and that she has scheduled to see psychiatry.  She states that her appointment is in the middle of April.  She states that sometimes the TV will talk to her.  Patient states she has extreme vivid dreams.  She states that sometimes between the audible or visual hallucinations or dreams she believes "I am already dead."  Patient does not want herself or her anybody else.  She agreed initially to see psychiatry with a voluntary commitment tonight.        Past Medical History:  Diagnosis Date  . Anemia   . Cervicalgia   . History of chickenpox   . Hives   . Mental disorder     Patient Active Problem List   Diagnosis Date Noted  .  Contact with and (suspected) exposure to viral hepatitis 10/10/2020  . Smoker 1 ppd 09/28/2020  . Pica ice daily 09/28/2020  . Heroin abuse (HCC) 09/28/2020  . Methamphetamine abuse (HCC) 09/28/2020  . Cocaine abuse (HCC) 09/28/2020  . Malnutrition of moderate degree 06/21/2020  . Cellulitis 06/20/2020  . Sepsis (HCC) 06/20/2020  . Chest pain 06/20/2020  . Polysubstance abuse (HCC) 06/20/2020  . Status post cesarean delivery 11/03/2019  . Vaginal bleeding in pregnancy 11/01/2019  . 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 History of  cesarean delivery 07/14/2019  . Suboxone maintenance treatment complicating pregnancy, antepartum (HCC) 07/14/2019  . Late prenatal care affecting pregnancy in second trimester 07/14/2019  . Anemia during pregnancy in second trimester 07/14/2019  . Bipolar disorder (HCC) 09/18/2015  . PTSD (post-traumatic stress disorder) 09/18/2015  . RAD (reactive airway disease) with wheezing 09/18/2015  . History of narcotic addiction (HCC) 09/18/2015  . Constipation due to pain medication therapy 09/18/2015  . Cervical pain 02/28/2009    Past Surgical History:  Procedure Laterality Date  . CESAREAN SECTION  2013  . CESAREAN SECTION  2015  . CESAREAN SECTION N/A 11/01/2019   Procedure: CESAREAN SECTION;  Surgeon: Natale Milch, MD;  Location: [facility] ORS;  Service: Obstetrics;  Laterality: N/A;  . INCISION AND DRAINAGE OF WOUND Right 06/25/2020   Procedure: IRRIGATION AND DEBRIDEMENT WOUND-Right Leg;  Surgeon: Signa Kell, MD;  Location: [facility] ORS;  Service: Orthopedics;  Laterality: Right;    Prior to Admission medications   Medication Sig Start Date End Date Taking? Authorizing Provider  amoxicillin-clavulanate (AUGMENTIN) 875-125 MG tablet Take 1 tablet by mouth every 12 (twelve) hours. 02/01/21   Dahlia Byes A, NP  ibuprofen (ADVIL) 200 MG tablet Take 200 mg by mouth every 6 (six) hours as needed. Pt reports taking as needed    [provider]  permethrin (ELIMITE) 5 % cream Apply from neck down. Leave on for 12 hours and then wash off. Repeat in 2 weeks if needed. 02/01/21   Janace Aris, NP    Allergies Patient has no known allergies.  Family History  Problem Relation Age of Onset  . Alcohol abuse Mother   . GER disease Mother   . Depression Mother   . Alcohol abuse Father   . Emphysema Father   . Diabetes Maternal Grandmother   . Heart disease Maternal Grandmother   . Hyperlipidemia Maternal Grandmother   . Hypertension Maternal Grandmother   . Kidney disease Maternal  Grandmother   . Lung cancer Maternal Grandmother     Social History Social History   Tobacco Use  . Smoking status: Current Every Day Smoker    Packs/day: 0.50    Years: 11.00    Pack years: 5.50    Types: Cigarettes    Start date: 09/17/2004  . Smokeless tobacco: Never Used  Vaping Use  . Vaping Use: Never used  Substance Use Topics  . Alcohol use: No    Alcohol/week: 0.0 standard drinks  . Drug use: Yes    Types: Cocaine     Review of Systems  Constitutional: No fever/chills Eyes: No visual changes. No discharge ENT: Positive for right lower dental infection.  Currently on antibiotics. Cardiovascular: no chest pain. Respiratory: no cough. No SOB. Gastrointestinal: No abdominal pain.  No nausea, no vomiting.  No diarrhea.  No constipation. Genitourinary: Negative for dysuria. No hematuria Musculoskeletal: Negative for musculoskeletal pain. Skin: Positive for pruritic rash, multiple  excoriations and open lesions Neurological: Negative for headaches, focal weakness or numbness. Psychological: Endorsing visual and auditory hallucinations.  Denies suicidal homicidal ideations.  States that sometimes she has a sensation that "I am already dead."  10 System ROS otherwise negative.  ____________________________________________   PHYSICAL EXAM:  VITAL SIGNS: ED Triage Vitals  Enc Vitals Group     BP 02/07/21 1823 107/78     Pulse Rate 02/07/21 1823 89     Resp 02/07/21 1823 16     Temp 02/07/21 1823 98 F (36.7 C)     Temp Source 02/07/21 1823 Oral     SpO2 02/07/21 1823 99 %     Weight --      Height --      Head Circumference --      Peak Flow --      Pain Score 02/07/21 1825 0     Pain Loc --      Pain Edu? --      Excl. in GC? --      Constitutional: Alert and oriented. Well appearing and in no acute distress. Eyes: Conjunctivae are normal. PERRL. EOMI. Head: Atraumatic. ENT:      Ears:       Nose: No congestion/rhinnorhea.      Mouth/Throat: Mucous  membranes are moist.  Findings in the mouth consistent with right lower dental infection.  There is mild erythema to the area.  Patient reports improvement and is already on antibiotics for same. Neck: No stridor.  Neck is supple full range of motion Hematological/Lymphatic/Immunilogical: No cervical lymphadenopathy. Cardiovascular: Normal rate, regular rhythm. Normal S1 and S2.  Good peripheral circulation. Respiratory: Normal respiratory effort without tachypnea or retractions. Lungs CTAB. Good air entry to the bases with no decreased or absent breath sounds. Musculoskeletal: Full range of motion to all extremities. No gross deformities appreciated. Neurologic:  Normal speech and language. No gross focal neurologic deficits are appreciated.  Skin:  Skin is warm, dry and intact.  Patient appears to have rash to the bilateral forearms.  This is covered with excoriations consistent with picking versus scratching.  Patient has several deeper lesions to the arms and legs.  Patient states that she has had hallucinations where she thought insects, bugs, worms were growing out of her.  She endorses digging at several of these areas including 1 lesion in particular in her elbow.  She states that she thought she was turning into a warm herself, but she saw this forming in her left elbow and took a rock and dog at her left elbow.  None of these have cellulitic changes.  There is no purulent drainage from any of these.  Again excoriations are profuse across the patient's forearms and legs. Psychiatric: Mood and affect are anxious.  Speech and behavior are slightly pressured.  Patient exhibits appropriate insight and judgement.  Patient endorses audible on visual hallucinations.  Patient is aware that they are hallucinations.  She states that she also has very vivid dreams.  Sometimes she states between the hallucinations and her drain she feels like she is "already dead."  Patient is aware of these feelings and  sensations, again adamantly denies any suicidal or homicidal ideations.  She is interested in receiving psychiatric care and states that she has already scheduled to see psychiatry in the middle of the month.  Patient initially agrees to see psychiatry here   ____________________________________________   LABS (all labs ordered are listed, but only abnormal results are displayed)  Labs  Reviewed  COMPREHENSIVE METABOLIC PANEL  ETHANOL  SALICYLATE LEVEL  CBC WITH DIFFERENTIAL/PLATELET  URINALYSIS, COMPLETE (UACMP) WITH MICROSCOPIC  URINE DRUG SCREEN, QUALITATIVE ([facility] ONLY)  ACETAMINOPHEN LEVEL  POC URINE PREG, ED   ____________________________________________  EKG   ____________________________________________  RADIOLOGY   No results found.  ____________________________________________    PROCEDURES  Procedure(s) performed:    Procedures    Medications - No data to display   ____________________________________________   INITIAL IMPRESSION / ASSESSMENT AND PLAN / ED COURSE  Pertinent labs & imaging results that were available during my care of the patient were reviewed by me and considered in my medical decision making (see chart for details).  Review of the [place] CSRS was performed in accordance of the [facility] prior to dispensing any controlled drugs.           Patient eloped prior to final diagnosis and disposition.  Patient came in with multiple complaints including skin complaints, dental infection as well as psychiatric complaints.  Patient denies any suicidal or homicidal ideations but agreed to talk to psychiatry.  Patient was to be voluntarily committed but left prior to being assessed.  Again there is no indication for IVC currently, though I did feel that patient needed to talk to psychiatry.  I feel that multiple of the issues that patient is experiencing is secondary to psychiatric condition.  Patient has not been treated with any medications.  Labs,  urinalysis, UDS had been ordered but these were not completed prior to patient leaving.  Again patient was voluntary and eloped.  She is aware of the risk but decided to leave anyway.  It appears that patient likely has a form of dermatitis to her forearms but the majority of patient's skin conditions are secondary to picking.  I feel that a large part of this is also secondary to psychiatric issues.  Patient did have a right lower dental infection which was visualized but patient is already on antibiotic therapy and states that this is improving.  Patient left without formal diagnosis and no new medications were prescribed.     This chart was dictated using voice recognition software/Dragon. Despite best efforts to proofread, errors can occur which can change the meaning. Any change was purely unintentional.    Racheal Patches, PA-C 02/07/21 2015    Gilles Chiquito, MD 02/08/21 [phone]

## 2021-02-07 NOTE — ED Notes (Addendum)
Patient not found in room. No labs/urine obtained. Cuthriell PA and Raquel Charge RN aware.

## 2021-02-07 NOTE — ED Triage Notes (Addendum)
Pt c/o generalized rash for a "while" since "October". Pt states her husband was treated for Pin Worms and she also did the treatment with no success for the rash. Pt has visible rash on B/L arms. Pt also c/o "smelling weird smell". Pt admits to using cocaine "earlier today" and that this is the only drug that she uses.

## 2021-02-28 ENCOUNTER — Other Ambulatory Visit: Payer: Self-pay

## 2021-02-28 ENCOUNTER — Encounter: Payer: Medicaid Other | Attending: Physician Assistant | Admitting: Physician Assistant

## 2021-02-28 DIAGNOSIS — L03115 Cellulitis of right lower limb: Secondary | ICD-10-CM | POA: Diagnosis not present

## 2021-02-28 DIAGNOSIS — L98499 Non-pressure chronic ulcer of skin of other sites with unspecified severity: Secondary | ICD-10-CM | POA: Insufficient documentation

## 2021-02-28 NOTE — Progress Notes (Signed)
Lori Pollard, Lori Pollard ([id number]) Visit Report for 02/28/2021 Allergy List Details Patient Name: Lori Pollard, Lori Pollard. Date of Service: 02/28/2021 3:00 PM Medical Record Number: [id number] Patient Account Number: [id number] Date of Birth/Sex: 1991/01/02 (29 y.o. F) Treating RN: Hansel Feinstein Primary Care Ola Fawver: SYSTEM, PCP Other Clinician: Lolita Cram Referring Shubham Thackston: Antoine Primas Treating Karenna Romanoff/Extender: Allen Derry Weeks in Treatment: 0 Allergies Active Allergies No Known Allergies Allergy Notes Electronic Signature(s) Signed: 02/28/2021 4:27:14 PM By: Hansel Feinstein Entered By: Hansel Feinstein on 02/28/2021 15:36:53 Lori Pollard, [name]. ([id number]) -------------------------------------------------------------------------------- Arrival Information Details Patient Name: Lori Gong D. Date of Service: 02/28/2021 3:00 PM Medical Record Number: [id number] Patient Account Number: [id number] Date of Birth/Sex: 02/18/1991 (29 y.o. F) Treating RN: Hansel Feinstein Primary Care Maverik Foot: SYSTEM, PCP Other Clinician: Lolita Cram Referring Rochelle Nephew: Antoine Primas Treating Jonta Gastineau/Extender: Rowan Blase in Treatment: 0 Visit Information Patient Arrived: Ambulatory Arrival Time: 15:12 Accompanied By: fiance Transfer Assistance: None Patient Identification Verified: Yes Secondary Verification Process Completed: Yes Patient Has Alerts: Yes Patient Alerts: NOT diabetic Notes No PCP physician Electronic Signature(s) Signed: 02/28/2021 4:27:14 PM By: Hansel Feinstein Entered By: Hansel Feinstein on 02/28/2021 15:46:57 Lori Pollard, [name]. ([id number]) -------------------------------------------------------------------------------- Clinic Level of Care Assessment Details Patient Name: Lori Gong D. Date of Service: 02/28/2021 3:00 PM Medical Record Number: [id number] Patient Account Number: [id number] Date of Birth/Sex: 05/08/1991 (29 y.o. F) Treating RN: Rogers Blocker Primary Care Avagrace Botelho: SYSTEM, PCP Other Clinician: Lolita Cram Referring Rodderick Holtzer: Antoine Primas Treating Khloey Chern/Extender: Rowan Blase in Treatment: 0 Clinic Level of Care Assessment Items TOOL 2 Quantity Score X - Use when only an EandM is performed on the INITIAL visit 1 0 ASSESSMENTS - Nursing Assessment / Reassessment X - General Physical Exam (combine w/ comprehensive assessment (listed just below) when performed on new 1 20 pt. evals) X- 1 25 Comprehensive Assessment (HX, ROS, Risk Assessments, Wounds Hx, etc.) ASSESSMENTS - Wound and Skin Assessment / Reassessment []  - Simple Wound Assessment / Reassessment - one wound 0 X- 2 5 Complex Wound Assessment / Reassessment - multiple wounds []  - 0 Dermatologic / Skin Assessment (not related to wound area) ASSESSMENTS - Ostomy and/or Continence Assessment and Care []  - Incontinence Assessment and Management 0 []  - 0 Ostomy Care Assessment and Management (repouching, etc.) PROCESS - Coordination of Care X - Simple Patient / Family Education for ongoing care 1 15 []  - 0 Complex (extensive) Patient / Family Education for ongoing care X- 1 10 Staff obtains , Records, Test Results / Process Orders []  - 0 Staff telephones HHA, Nursing Homes / Clarify orders / etc []  - 0 Routine Transfer to another Facility (non-emergent condition) []  - 0 Routine Hospital Admission (non-emergent condition) []  - 0 New Admissions / / Ordering NPWT, Apligraf, etc. []  - 0 Emergency Hospital Admission (emergent condition) X- 1 10 Simple Discharge Coordination []  - 0 Complex (extensive) Discharge Coordination PROCESS - Special Needs []  - Pediatric / Minor Patient Management 0 []  - 0 Isolation Patient Management []  - 0 Hearing / Language / Visual special needs []  - 0 Assessment of Community assistance (transportation, D/C planning, etc.) []  - 0 Additional assistance / Altered mentation []  -  0 Support Surface(s) Assessment (bed, cushion, seat, etc.) INTERVENTIONS - Wound Cleansing / Measurement X - Wound Imaging (photographs - any number of wounds) 1 5 []  - 0 Wound Tracing (instead of photographs) []  - 0 Simple Wound Measurement - one wound X- 2 5 Complex Wound Measurement - multiple wounds Lori Pollard, [name]. (  [id number]) []  - 0 Simple Wound Cleansing - one wound X- 2 5 Complex Wound Cleansing - multiple wounds INTERVENTIONS - Wound Dressings []  - Small Wound Dressing one or multiple wounds 0 X- 1 15 Medium Wound Dressing one or multiple wounds []  - 0 Large Wound Dressing one or multiple wounds []  - 0 Application of Medications - injection INTERVENTIONS - Miscellaneous []  - External ear exam 0 []  - 0 Specimen Collection (cultures, biopsies, blood, body fluids, etc.) []  - 0 Specimen(s) / Culture(s) sent or taken to Lab for analysis []  - 0 Patient Transfer (multiple staff / Nurse, adultHoyer Lift / Similar devices) []  - 0 Simple Staple / Suture removal (25 or less) []  - 0 Complex Staple / Suture removal (26 or more) []  - 0 Hypo / Hyperglycemic Management (close monitor of Blood Glucose) []  - 0 Ankle / Brachial Index (ABI) - do not check if billed separately Has the patient been seen at the hospital within the last three years: Yes Total Score: 130 Level Of Care: New/Established - Level 4 Electronic Signature(s) Signed: 02/28/2021 5:15:58 PM By: Phillis HaggisSanchez Pereyda, Dondra PraderKenia RN Entered By: Phillis HaggisSanchez Pereyda, Kenia on 02/28/2021 16:08:16 Lori DodgeJOHNSTON, [name]. ([id number]) -------------------------------------------------------------------------------- Encounter Discharge Information Details Patient Name: Lori GongJOHNSTON, Makila D. Date of Service: 02/28/2021 3:00 PM Medical Record Number: [id number] Patient Account Number: [id number] Date of Birth/Sex: [birth year] (29 y.o. F) Treating RN: Rogers BlockerSanchez, Kenia Primary Care Javarri Segal: SYSTEM, PCP Other Clinician: Lolita CramBurnette,  Kyara Referring Acxel Dingee: Antoine PrimasSmith, Zachary Treating Caelum Federici/Extender: Rowan BlaseStone, Hoyt Weeks in Treatment: 0 Encounter Discharge Information Items Discharge Condition: Stable Ambulatory Status: Ambulatory Discharge Destination: Home Transportation: Private Auto Accompanied By: Ronnald Rampfianco Schedule Follow-up Appointment: Yes Clinical Summary of Care: Electronic Signature(s) Signed: 02/28/2021 4:23:12 PM By: Lolita CramBurnette, Kyara Entered By: Lolita CramBurnette, Kyara on 02/28/2021 16:17:15 Lori Pollard, [name]. ([id number]) -------------------------------------------------------------------------------- Lower Extremity Assessment Details Patient Name: Lori GongJOHNSTON, Raaga D. Date of Service: 02/28/2021 3:00 PM Medical Record Number: [id number] Patient Account Number: [id number] Date of Birth/Sex: [birth year] (29 y.o. F) Treating RN: Hansel FeinsteinBishop, Joy Primary Care Kyleigh Nannini: SYSTEM, PCP Other Clinician: Lolita CramBurnette, Kyara Referring Shandelle Borrelli: Antoine PrimasSmith, Zachary Treating Ewan Grau/Extender: Rowan BlaseStone, Hoyt Weeks in Treatment: 0 Edema Assessment Assessed: [Left: Yes] [Right: Yes] Edema: [Left: No] [Right: No] Calf Left: Right: Point of Measurement: 28 cm From Medial Instep 29.5 cm 29.5 cm Ankle Left: Right: Point of Measurement: 9 cm From Medial Instep 18.5 cm 18.5 cm Knee To Floor Left: Right: From Medial Instep 36 cm 36 cm Vascular Assessment Pulses: Dorsalis Pedis Palpable: [Left:Yes] [Right:Yes] Electronic Signature(s) Signed: 02/28/2021 4:27:14 PM By: Hansel FeinsteinBishop, Joy Entered By: Hansel FeinsteinBishop, Joy on 02/28/2021 15:35:05 Lori Pollard, [name]. ([id number]) -------------------------------------------------------------------------------- Multi Wound Chart Details Patient Name: Lori GongJOHNSTON, Shaivi D. Date of Service: 02/28/2021 3:00 PM Medical Record Number: [id number] Patient Account Number: [id number] Date of Birth/Sex: [birth year] (29 y.o. F) Treating RN: Rogers BlockerSanchez, Kenia Primary Care Konner Saiz: SYSTEM, PCP Other Clinician: Lolita CramBurnette,  Kyara Referring Judiann Celia: Antoine PrimasSmith, Zachary Treating Brie Eppard/Extender: Rowan BlaseStone, Hoyt Weeks in Treatment: 0 Vital Signs Height(in): 59 Pulse(bpm): 88 Weight(lbs): 98 Blood Pressure(mmHg): 110/60 Body Mass Index(BMI): 20 Temperature(F): 98.0 Respiratory Rate(breaths/min): 18 Photos: [N/A:N/A] Wound Location: Right, Proximal Lower Leg Right, Proximal Hand - Dorsum N/A Wounding Event: Gradually Appeared Gradually Appeared N/A Primary Etiology: Abscess To be determined N/A Date Acquired: 06/24/2020 01/28/2021 N/A Weeks of Treatment: 0 0 N/A Wound Status: Open Open N/A Measurements L x W x D (cm) 1.7x1.2x0.1 2x1x0.1 N/A Area (cm) : 1.602 1.571 N/A Volume (cm) : 0.16 0.157 N/A Classification: Full Thickness Without Exposed Full Thickness Without Exposed N/A Support Structures  Support Structures Exudate Amount: Small Small N/A Exudate Type: Serosanguineous Serosanguineous N/A Exudate Color: red, brown red, brown N/A Granulation Amount: Small (1-33%) Small (1-33%) N/A Granulation Quality: Pink N/A N/A Necrotic Amount: Large (67-100%) Large (67-100%) N/A Necrotic Tissue: Eschar Eschar N/A Exposed Structures: Fat Layer (Subcutaneous Tissue): Fat Layer (Subcutaneous Tissue): N/A Yes Yes Fascia: No Fascia: No Tendon: No Tendon: No Muscle: No Muscle: No Joint: No Joint: No Bone: No Bone: No Epithelialization: Small (1-33%) N/A N/A Treatment Notes Electronic Signature(s) Signed: 02/28/2021 5:15:58 PM By: Phillis Haggis, Dondra Prader RN Entered By: Phillis Haggis, Dondra Prader on 02/28/2021 16:01:31 Lori Pollard ([id number]) -------------------------------------------------------------------------------- Multi-Disciplinary Care Plan Details Patient Name: Lori Gong D. Date of Service: 02/28/2021 3:00 PM Medical Record Number: [id number] Patient Account Number: [id number] Date of Birth/Sex: 01-19-1991 (29 y.o. F) Treating RN: Rogers Blocker Primary Care Areli Frary: SYSTEM,  PCP Other Clinician: Lolita Cram Referring Halena Mohar: Antoine Primas Treating Aariona Momon/Extender: Rowan Blase in Treatment: 0 Active Inactive Orientation to the Wound Care Program Nursing Diagnoses: Knowledge deficit related to the wound healing center program Goals: Patient/caregiver will verbalize understanding of the Wound Healing Center Program Date Initiated: 02/28/2021 Target Resolution Date: 02/28/2021 Goal Status: Active Interventions: Provide education on orientation to the wound center Notes: Wound/Skin Impairment Nursing Diagnoses: Impaired tissue integrity Goals: Patient/caregiver will verbalize understanding of skin care regimen Date Initiated: 02/28/2021 Target Resolution Date: 02/28/2021 Goal Status: Active Ulcer/skin breakdown will have a volume reduction of 30% by week 4 Date Initiated: 02/28/2021 Target Resolution Date: 03/30/2021 Goal Status: Active Ulcer/skin breakdown will have a volume reduction of 50% by week 8 Date Initiated: 02/28/2021 Target Resolution Date: 04/30/2021 Goal Status: Active Ulcer/skin breakdown will have a volume reduction of 80% by week 12 Date Initiated: 02/28/2021 Target Resolution Date: 05/30/2021 Goal Status: Active Interventions: Assess patient/caregiver ability to obtain necessary supplies Assess patient/caregiver ability to perform ulcer/skin care regimen upon admission and as needed Assess ulceration(s) every visit Provide education on ulcer and skin care Treatment Activities: Referred to DME Lasya Vetter for dressing supplies : 02/28/2021 Skin care regimen initiated : 02/28/2021 Notes: Electronic Signature(s) Signed: 02/28/2021 5:15:58 PM By: Phillis Haggis, Dondra Prader RN Entered By: Phillis Haggis, Dondra Prader on 02/28/2021 16:01:16 Lori Gong D. ([id number]) -------------------------------------------------------------------------------- Pain Assessment Details Patient Name: Lori Gong D. Date of Service:  02/28/2021 3:00 PM Medical Record Number: [id number] Patient Account Number: [id number] Date of Birth/Sex: 12/21/1990 (29 y.o. F) Treating RN: Hansel Feinstein Primary Care Ziair Penson: SYSTEM, PCP Other Clinician: Lolita Cram Referring Ameila Weldon: Antoine Primas Treating Hilbert Briggs/Extender: Rowan Blase in Treatment: 0 Active Problems Location of Pain Severity and Description of Pain Patient Has Paino Yes Site Locations Pain Location: Pain in Ulcers Rate the pain. Current Pain Level: 6 Pain Management and Medication Current Pain Management: Notes says pain is everywhere she is itchy Electronic Signature(s) Signed: 02/28/2021 4:27:14 PM By: Hansel Feinstein Entered By: Hansel Feinstein on 02/28/2021 15:15:24 Lori Pollard ([id number]) -------------------------------------------------------------------------------- Patient/Caregiver Education Details Patient Name: Lori Gong D. Date of Service: 02/28/2021 3:00 PM Medical Record Number: [id number] Patient Account Number: [id number] Date of Birth/Gender: 05-09-1991 (29 y.o. F) Treating RN: Rogers Blocker Primary Care Physician: SYSTEM, PCP Other Clinician: Lolita Cram Referring Physician: Antoine Primas Treating Physician/Extender: Rowan Blase in Treatment: 0 Education Assessment Education Provided To: Patient Education Topics Provided Wound/Skin Impairment: Methods: Explain/Verbal Responses: State content correctly Electronic Signature(s) Signed: 02/28/2021 5:15:58 PM By: Phillis Haggis, Dondra Prader RN Entered By: Phillis Haggis, Dondra Prader on 02/28/2021 16:08:30 Lori, [name]. ([id number]) -------------------------------------------------------------------------------- Wound Assessment Details Patient Name: Lori Gong  D. Date of Service: 02/28/2021 3:00 PM Medical Record Number: [id number] Patient Account Number: [id number] Date of Birth/Sex: [birth year] (29 y.o. F) Treating RN: Hansel Feinstein Primary Care  Rissie Sculley: SYSTEM, PCP Other Clinician: Lolita Cram Referring Jlynn Ly: Antoine Primas Treating Geroldine Esquivias/Extender: Rowan Blase in Treatment: 0 Wound Status Wound Number: 1 Primary Etiology: Abscess Wound Location: Right, Proximal Lower Leg Wound Status: Open Wounding Event: Gradually Appeared Date Acquired: 06/24/2020 Weeks Of Treatment: 0 Clustered Wound: No Photos Wound Measurements Length: (cm) 1.7 Width: (cm) 1.2 Depth: (cm) 0.1 Area: (cm) 1.602 Volume: (cm) 0.16 % Reduction in Area: % Reduction in Volume: Epithelialization: Small (1-33%) Tunneling: No Undermining: No Wound Description Classification: Full Thickness Without Exposed Support Structu Exudate Amount: Small Exudate Type: Serosanguineous Exudate Color: red, brown res Foul Odor After Cleansing: No Slough/Fibrino Yes Wound Bed Granulation Amount: Small (1-33%) Exposed Structure Granulation Quality: Pink Fascia Exposed: No Necrotic Amount: Large (67-100%) Fat Layer (Subcutaneous Tissue) Exposed: Yes Necrotic Quality: Eschar Tendon Exposed: No Muscle Exposed: No Joint Exposed: No Bone Exposed: No Treatment Notes Wound #1 (Lower Leg) Wound Laterality: Right, Proximal Cleanser Peri-Wound Care Topical Santyl Collagenase Ointment, 30 (gm), tube Dondlinger, Krissa D. ([id number]) Discharge Instruction: apply nickel thick to wound bed only Primary Dressing Secondary Dressing Coverlet Latex-Free Fabric Adhesive Dressings Discharge Instruction: 1.5 x 2 Secured With Compression Wrap Compression Stockings Add-Ons Electronic Signature(s) Signed: 02/28/2021 4:27:14 PM By: Hansel Feinstein Entered By: Hansel Feinstein on 02/28/2021 15:28:05 Lori Pollard, [name]. ([id number]) -------------------------------------------------------------------------------- Wound Assessment Details Patient Name: Lori Gong D. Date of Service: 02/28/2021 3:00 PM Medical Record Number: [id number] Patient Account  Number: [id number] Date of Birth/Sex: [birth year] (29 y.o. F) Treating RN: Hansel Feinstein Primary Care Madeliene Tejera: SYSTEM, PCP Other Clinician: Lolita Cram Referring Buna Cuppett: Antoine Primas Treating Christeena Krogh/Extender: Rowan Blase in Treatment: 0 Wound Status Wound Number: 2 Primary Etiology: To be determined Wound Location: Right, Proximal Hand - Dorsum Wound Status: Open Wounding Event: Gradually Appeared Date Acquired: 01/28/2021 Weeks Of Treatment: 0 Clustered Wound: No Photos Wound Measurements Length: (cm) 2 Width: (cm) 1 Depth: (cm) 0.1 Area: (cm) 1.571 Volume: (cm) 0.157 % Reduction in Area: % Reduction in Volume: Tunneling: No Undermining: No Wound Description Classification: Full Thickness Without Exposed Support Structu Exudate Amount: Small Exudate Type: Serosanguineous Exudate Color: red, brown res Foul Odor After Cleansing: No Slough/Fibrino Yes Wound Bed Granulation Amount: Small (1-33%) Exposed Structure Necrotic Amount: Large (67-100%) Fascia Exposed: No Necrotic Quality: Eschar Fat Layer (Subcutaneous Tissue) Exposed: Yes Tendon Exposed: No Muscle Exposed: No Joint Exposed: No Bone Exposed: No Electronic Signature(s) Signed: 02/28/2021 4:27:14 PM By: Hansel Feinstein Entered By: Hansel Feinstein on 02/28/2021 15:32:51 Zill, Robina D. ([id number]) -------------------------------------------------------------------------------- Vitals Details Patient Name: Lori Gong D. Date of Service: 02/28/2021 3:00 PM Medical Record Number: [id number] Patient Account Number: [id number] Date of Birth/Sex: [birth year] (29 y.o. F) Treating RN: Hansel Feinstein Primary Care Demarkis Gheen: SYSTEM, PCP Other Clinician: Lolita Cram Referring Mont Jagoda: Antoine Primas Treating Laraina Sulton/Extender: Rowan Blase in Treatment: 0 Vital Signs Time Taken: 15:17 Temperature (F): 98.0 Height (in): 59 Pulse (bpm): 88 Source: Stated Respiratory Rate (breaths/min):  18 Weight (lbs): 98 Blood Pressure (mmHg): 110/60 Source: Stated Reference Range: 80 - 120 mg / dl Body Mass Index (BMI): 19.8 Electronic Signature(s) Signed: 02/28/2021 4:27:14 PM By: Hansel Feinstein Entered ByHansel Feinstein on 02/28/2021 15:18:00

## 2021-02-28 NOTE — Progress Notes (Signed)
MIRIAM, LILES ([id number]) Visit Report for 02/28/2021 Abuse/Suicide Risk Screen Details Patient Name: Lori Pollard, Lori Pollard. Date of Service: 02/28/2021 3:00 PM Medical Record Number: [id number] Patient Account Number: [id number] Date of Birth/Sex: 08-May-1991 (29 y.o. F) Treating RN: Hansel Feinstein Primary Care Lydie Stammen: SYSTEM, PCP Other Clinician: Lolita Cram Referring Mariadejesus Cade: Antoine Primas Treating Arrionna Serena/Extender: Rowan Blase in Treatment: 0 Abuse/Suicide Risk Screen Items Answer ABUSE RISK SCREEN: Has anyone close to you tried to hurt or harm you recentlyo No Do you feel uncomfortable with anyone in your familyo No Has anyone forced you do things that you didnot want to doo No Electronic Signature(s) Signed: 02/28/2021 4:27:14 PM By: Hansel Feinstein Entered By: Hansel Feinstein on 02/28/2021 15:37:31 Lori Pollard, [name]. ([id number]) -------------------------------------------------------------------------------- Activities of Daily Living Details Patient Name: Lori Pollard, [name]. Date of Service: 02/28/2021 3:00 PM Medical Record Number: [id number] Patient Account Number: [id number] Date of Birth/Sex: 1991-06-24 (29 y.o. F) Treating RN: Hansel Feinstein Primary Care Jalon Blackwelder: SYSTEM, PCP Other Clinician: Lolita Cram Referring Clayborn Milnes: Antoine Primas Treating Dailynn Nancarrow/Extender: Rowan Blase in Treatment: 0 Activities of Daily Living Items Answer Activities of Daily Living (Please select one for each item) Drive Automobile Completely Able Take Medications Completely Able Use Telephone Completely Able Care for Appearance Completely Able Use Toilet Completely Able Bath / Shower Completely Able Dress Self Completely Able Feed Self Completely Able Walk Completely Able Get In / Out Bed Completely Able Housework Completely Able Prepare Meals Completely Able Handle Money Completely Able Shop for Self Completely Able Electronic Signature(s) Signed:  02/28/2021 4:27:14 PM By: Hansel Feinstein Entered By: Hansel Feinstein on 02/28/2021 15:37:21 Lori Pollard, [name]. ([id number]) -------------------------------------------------------------------------------- Fall Risk Assessment Details Patient Name: Lori Gong D. Date of Service: 02/28/2021 3:00 PM Medical Record Number: [id number] Patient Account Number: [id number] Date of Birth/Sex: 1991/04/16 (29 y.o. F) Treating RN: Hansel Feinstein Primary Care Katieann Hungate: SYSTEM, PCP Other Clinician: Lolita Cram Referring Mikyla Schachter: Antoine Primas Treating Careen Mauch/Extender: Rowan Blase in Treatment: 0 Fall Risk Assessment Items Have you had 2 or more falls in the last 12 monthso 0 No Have you had any fall that resulted in injury in the last 12 monthso 0 No FALLS RISK SCREEN History of falling - immediate or within 3 months 0 No Secondary diagnosis (Do you have 2 or more medical diagnoseso) 0 No Ambulatory aid None/bed rest/wheelchair/nurse 0 Yes Crutches/cane/walker 0 No Furniture 0 No Intravenous therapy Access/Saline/Heparin Lock 0 No Gait/Transferring Normal/ bed rest/ wheelchair 0 Yes Weak (short steps with or without shuffle, stooped but able to lift head while walking, may 0 No seek support from furniture) Impaired (short steps with shuffle, may have difficulty arising from chair, head down, impaired 0 No balance) Mental Status Oriented to own ability 0 Yes Electronic Signature(s) Signed: 02/28/2021 4:27:14 PM By: Hansel Feinstein Entered By: Hansel Feinstein on 02/28/2021 15:37:52 Lori Pollard, [name]. ([id number]) -------------------------------------------------------------------------------- Foot Assessment Details Patient Name: Lori Gong D. Date of Service: 02/28/2021 3:00 PM Medical Record Number: [id number] Patient Account Number: [id number] Date of Birth/Sex: [birth year] (29 y.o. F) Treating RN: Hansel Feinstein Primary Care Darla Mcdonald: SYSTEM, PCP Other Clinician: Lolita Cram Referring Jacobo Moncrief: Antoine Primas Treating Fortune Torosian/Extender: Rowan Blase in Treatment: 0 Foot Assessment Items Site Locations + = Sensation present, - = Sensation absent, C = Callus, U = Ulcer R = Redness, W = Warmth, M = Maceration, PU = Pre-ulcerative lesion F = Fissure, S = Swelling, D = Dryness Assessment Right: Left: Other Deformity: No No Prior Foot Ulcer: No No Prior Amputation:  No No Charcot Joint: No No Ambulatory Status: Ambulatory Without Help Gait: Steady Electronic Signature(s) Signed: 02/28/2021 4:27:14 PM By: Hansel Feinstein Entered By: Hansel Feinstein on 02/28/2021 15:39:02 Lori Pollard, [name]. ([id number]) -------------------------------------------------------------------------------- Nutrition Risk Screening Details Patient Name: Lori Gong D. Date of Service: 02/28/2021 3:00 PM Medical Record Number: [id number] Patient Account Number: [id number] Date of Birth/Sex: 06-Apr-1991 (29 y.o. F) Treating RN: Hansel Feinstein Primary Care Lokelani Lutes: SYSTEM, PCP Other Clinician: Lolita Cram Referring Zyad Boomer: Antoine Primas Treating Lori Pollard: Rowan Blase in Treatment: 0 Height (in): 59 Weight (lbs): 98 Body Mass Index (BMI): 19.8 Nutrition Risk Screening Items Score Screening NUTRITION RISK SCREEN: I have an illness or condition that made me change the kind and/or amount of food I eat 0 No I eat fewer than two meals per day 3 Yes I eat few fruits and vegetables, or milk products 2 Yes I have three or more drinks of beer, liquor or wine almost every day 0 No I have tooth or mouth problems that make it hard for me to eat 2 Yes I don't always have enough money to buy the food I need 0 No I eat alone most of the time 0 No I take three or more different prescribed or over-the-counter drugs a day 0 No Without wanting to, I have lost or gained 10 pounds in the last six months 0 No I am not always physically able to shop, cook and/or feed  myself 0 No Nutrition Protocols Good Risk Protocol Moderate Risk Protocol 0 Provide education on nutrition High Risk Proctocol Risk Level: High Risk Score: 7 Electronic Signature(s) Signed: 02/28/2021 4:27:14 PM By: Hansel Feinstein Entered ByHansel Feinstein on 02/28/2021 15:38:34

## 2021-03-01 NOTE — Progress Notes (Signed)
Lori Pollard, Lori Pollard ([id number]) Visit Report for 02/28/2021 Chief Complaint Document Details Patient Name: Lori Pollard, Lori Pollard. Date of Service: 02/28/2021 3:00 PM Medical Record Number: [id number] Patient Account Number: [id number] Date of Birth/Sex: 06-Jun-1991 (30 y.o. F) Treating RN: Rogers Blocker Primary Care Provider: SYSTEM, PCP Other Clinician: Lolita Cram Referring Provider: Antoine Primas Treating Provider/Extender: Rowan Blase in Treatment: 0 Information Obtained from: Patient Chief Complaint Right LE and right wrist ulcers Electronic Signature(s) Signed: 02/28/2021 3:57:03 PM By: Lenda Kelp PA-C Entered By: Lenda Kelp on 02/28/2021 15:57:03 Creson, Daksha D. ([id number]) -------------------------------------------------------------------------------- HPI Details Patient Name: Lori Gong D. Date of Service: 02/28/2021 3:00 PM Medical Record Number: [id number] Patient Account Number: [id number] Date of Birth/Sex: 07-30-1991 (30 y.o. F) Treating RN: Rogers Blocker Primary Care Provider: SYSTEM, PCP Other Clinician: Lolita Cram Referring Provider: Antoine Primas Treating Provider/Extender: Rowan Blase in Treatment: 0 History of Present Illness HPI Description: Started August 20214/22/2022 upon evaluation today patient presents for initial inspection here in our clinic concerning issues that she has been having with areas on her right knee as well as her arms and wrist and her legs which are excoriated. This appears to be something that may be unintentionally self-inflicted as she feels like she has had some type of "bugs or worms under her skin". She is concerned about maggots and/or scabies. She has been treated multiple times for scabies. With that being said the area on her knee in particular is a site where she tells me this started as a result of a needle injury which again I am unsure exactly what is going on in that regard.  With that being said I think that the patient is in general doing decently well with what I see I think most of the wounds that I see currently are probably more factitial in nature although I do not think this is intentional I think it is unintentional due to the fact that she is perceiving that there may be bugs under her skin. Patient was seen with her fianco in the office today as well. Electronic Signature(s) Signed: 03/01/2021 9:23:33 AM By: Lenda Kelp PA-C Previous Signature: 02/28/2021 5:39:17 PM Version By: Lenda Kelp PA-C Entered By: Lenda Kelp on 03/01/2021 09:23:33 Lori Pollard ([id number]) -------------------------------------------------------------------------------- Physical Exam Details Patient Name: Lori Gong D. Date of Service: 02/28/2021 3:00 PM Medical Record Number: [id number] Patient Account Number: [id number] Date of Birth/Sex: [birth year] (30 y.o. F) Treating RN: Rogers Blocker Primary Care Provider: SYSTEM, PCP Other Clinician: Lolita Cram Referring Provider: Antoine Primas Treating Provider/Extender: Rowan Blase in Treatment: 0 Constitutional sitting or standing blood pressure is within target range for patient.. pulse regular and within target range for patient.Marland Kitchen respirations regular, non- labored and within target range for patient.Marland Kitchen temperature within target range for patient.. Well-nourished and well-hydrated in no acute distress. Eyes conjunctiva clear no eyelid edema noted. pupils equal round and reactive to light and accommodation. Ears, Nose, Mouth, and Throat no gross abnormality of ear auricles or external auditory canals. normal hearing noted during conversation. mucus membranes moist. Respiratory normal breathing without difficulty. Cardiovascular 2+ dorsalis pedis/posterior tibialis pulses. no clubbing, cyanosis, significant edema, <3 sec cap refill. Musculoskeletal normal gait and posture. no significant  deformity or arthritic changes, no loss or range of motion, no clubbing. Psychiatric this patient is able to make decisions and demonstrates good insight into disease process. Alert and Oriented x 3. pleasant and cooperative. Notes Upon inspection patient's wounds again appear to be  more excoriations over her arms as well as her right knee. I think that potentially the biggest issue right now is that they are somewhat dry and really need to have some debridement. With that being said I will think sharp debridement is the right way to go right now I think that we can probably see about utilizing some Santyl to see if we can soften things up. And get moving along. Electronic Signature(s) Signed: 03/01/2021 9:24:18 AM By: Lenda Kelp PA-C Entered By: Lenda Kelp on 03/01/2021 09:24:18 Lori Pollard ([id number]) -------------------------------------------------------------------------------- Physician Orders Details Patient Name: Lori Gong D. Date of Service: 02/28/2021 3:00 PM Medical Record Number: [id number] Patient Account Number: [id number] Date of Birth/Sex: [birth year] (30 y.o. F) Treating RN: Rogers Blocker Primary Care Provider: SYSTEM, PCP Other Clinician: Lolita Cram Referring Provider: Antoine Primas Treating Provider/Extender: Rowan Blase in Treatment: 0 Verbal / Phone Orders: No Diagnosis Coding ICD-10 Coding Code Description L03.115 Cellulitis of right lower limb S81.801A Unspecified open wound, right lower leg, initial encounter R21 Rash and other nonspecific skin eruption S61.401A Unspecified open wound of right hand, initial encounter Follow-up Appointments o Return Appointment in 2 weeks. Bathing/ Shower/ Hygiene o Wash wounds with antibacterial soap and water. Wound Treatment Wound #1 - Lower Leg Wound Laterality: Right, Proximal Cleanser: Soap and Water 1 x Per Day/15 Days Discharge Instructions: Gently cleanse wound with  antibacterial soap, rinse and pat dry prior to dressing wounds Topical: Santyl Collagenase Ointment, 30 (gm), tube 1 x Per Day/15 Days Discharge Instructions: apply nickel thick to wound bed only Secondary Dressing: Coverlet Latex-Free Fabric Adhesive Dressings 1 x Per Day/15 Days Discharge Instructions: 1.5 x 2 Wound #2 - Hand - Dorsum Wound Laterality: Right, Proximal Cleanser: Soap and Water 1 x Per Day/15 Days Discharge Instructions: Gently cleanse wound with antibacterial soap, rinse and pat dry prior to dressing wounds Topical: Santyl Collagenase Ointment, 30 (gm), tube 1 x Per Day/15 Days Discharge Instructions: apply nickel thick to wound bed only Secondary Dressing: Coverlet Latex-Free Fabric Adhesive Dressings 1 x Per Day/15 Days Discharge Instructions: 1.5 x 2 Patient Medications Allergies: No Known Allergies Notifications Medication Indication Start End Santyl 02/28/2021 DOSE topical 250 unit/gram ointment - ointment topical Apply nickel thick daily to the wound bed and then cover with a dressing as directed in clinic x 30 days Electronic Signature(s) Signed: 02/28/2021 5:11:46 PM By: Lenda Kelp PA-C Entered By: Lenda Kelp on 02/28/2021 17:11:46 Lori Pollard, [name]. ([id number]) -------------------------------------------------------------------------------- Prescription 02/28/2021 Patient Name: Lori Gong D. Provider: Allen Derry PA-C Date of Birth: [birth year] NPI#: [id number] Sex: F DEA#: [id number] Phone #: [phone] License #: Patient Address: [facility] [address] [facility] [place], [place] [address] [address], [address], [place] [address] [phone] Allergies No Known Allergies Medication Medication: Route: Strength: Form: Santyl topical 250 unit/gram ointment Class: TOPICAL/MUCOUS MEMBR./SUBCUT. ENZYMES Dose: Frequency / Time: Indication: ointment topical Apply  nickel thick daily to the wound bed and then cover with a dressing as directed in clinic x 30 days Number of Refills: Number of Units: 0 Thirty (30) Gram(s) Generic Substitution: Start Date: End Date: Administered at Facility: Substitution Permitted 02/28/2021 No Note to Pharmacy: Manufacturer's Note: The total quantity has been calculated in grams due to Santyl being available in either 30 or 90 gram tubes. Hand Signature: Date(s): Electronic Signature(s) Signed: 03/01/2021 1:09:49 PM By: Lenda Kelp PA-C Entered By: Lenda Kelp on 02/28/2021 17:11:46 Pillsbury, Momo  D. ([id number]) --------------------------------------------------------------------------------  Problem List Details Patient Name: OYINDAMOLA, KEY. Date of Service: 02/28/2021 3:00 PM Medical Record Number: [id number] Patient Account Number: [id number] Date of Birth/Sex: [birth year] (30 y.o. F) Treating RN: Rogers Blocker Primary Care Provider: SYSTEM, PCP Other Clinician: Lolita Cram Referring Provider: Antoine Primas Treating Provider/Extender: Rowan Blase in Treatment: 0 Active Problems ICD-10 Encounter Code Description Active Date MDM Diagnosis L03.115 Cellulitis of right lower limb 02/28/2021 No Yes S81.801A Unspecified open wound, right lower leg, initial encounter 02/28/2021 No Yes R21 Rash and other nonspecific skin eruption 02/28/2021 No Yes S61.401A Unspecified open wound of right hand, initial encounter 02/28/2021 No Yes Inactive Problems Resolved Problems Electronic Signature(s) Signed: 02/28/2021 3:56:35 PM By: Lenda Kelp PA-C Entered By: Lenda Kelp on 02/28/2021 15:56:35 Panebianco, Sahithi D. ([id number]) -------------------------------------------------------------------------------- Progress Note Details Patient Name: Lori Gong D. Date of Service: 02/28/2021 3:00 PM Medical Record Number: [id number] Patient Account Number: [id number] Date of Birth/Sex:  Jul 25, 1991 (30 y.o. F) Treating RN: Rogers Blocker Primary Care Provider: SYSTEM, PCP Other Clinician: Lolita Cram Referring Provider: Antoine Primas Treating Provider/Extender: Rowan Blase in Treatment: 0 Subjective Chief Complaint Information obtained from Patient Right LE and right wrist ulcers History of Present Illness (HPI) Started August 20214/22/2022 upon evaluation today patient presents for initial inspection here in our clinic concerning issues that she has been having with areas on her right knee as well as her arms and wrist and her legs which are excoriated. This appears to be something that may be unintentionally self-inflicted as she feels like she has had some type of "bugs or worms under her skin". She is concerned about maggots and/or scabies. She has been treated multiple times for scabies. With that being said the area on her knee in particular is a site where she tells me this started as a result of a needle injury which again I am unsure exactly what is going on in that regard. With that being said I think that the patient is in general doing decently well with what I see I think most of the wounds that I see currently are probably more factitial in nature although I do not think this is intentional I think it is unintentional due to the fact that she is perceiving that there may be bugs under her skin. Patient was seen with her fianc in the office today as well. Patient History Information obtained from Patient. 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 Medical History Hematologic/Lymphatic Patient has history of Anemia Gastrointestinal Patient has history of Hepatitis C - tested but unsure if she may have i Hospitalization/Surgery History - cesarean x3. Review of Systems  (ROS) Constitutional Symptoms (General Health) Denies complaints or symptoms of Fatigue, Fever, Chills, Marked Weight Change. Eyes Denies complaints or symptoms of Dry Eyes, Vision Changes, Glasses / Contacts. Ear/Nose/Mouth/Throat Complains or has symptoms of Sinusitis. Respiratory Denies complaints or symptoms of Chronic or frequent coughs, Shortness of Breath. Cardiovascular Denies complaints or symptoms of Chest pain, LE edema. Endocrine Denies complaints or symptoms of Hepatitis, Thyroid disease, Polydypsia (Excessive Thirst). Genitourinary Denies complaints or symptoms of Kidney failure/ Dialysis, Incontinence/dribbling. Integumentary (Skin) says fine until 8/21 when skin issues/right leg abcess occurred and then anxiety Musculoskeletal Denies complaints or symptoms of Muscle Pain, Muscle Weakness. Neurologic Denies complaints or symptoms of Numbness/parasthesias,  Focal/Weakness. 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 ([id number]) Objective Constitutional sitting or standing blood pressure is within target range for patient.. pulse regular and within target range for patient.Marland Kitchen respirations regular, non- labored and within target range for patient.Marland Kitchen temperature within target range for patient.. Well-nourished and well-hydrated in no acute distress. Vitals Time Taken: 3:17 PM, Height: 59 in, Source: Stated, Weight: 98 lbs, Source: Stated, BMI: 19.8, Temperature: 98.0 F, Pulse: 88 bpm, Respiratory Rate: 18 breaths/min, Blood Pressure: 110/60 mmHg. Eyes conjunctiva clear no eyelid edema noted. pupils equal round and reactive to light and accommodation. Ears, Nose, Mouth, and Throat no gross abnormality of ear auricles or external auditory canals. normal hearing noted during conversation. mucus  membranes moist. Respiratory normal breathing without difficulty. Cardiovascular 2+ dorsalis pedis/posterior tibialis pulses. no clubbing, cyanosis, significant edema, Musculoskeletal normal gait and posture. no significant deformity or arthritic changes, no loss or range of motion, no clubbing. Psychiatric this patient is able to make decisions and demonstrates good insight into disease process. Alert and Oriented x 3. pleasant and cooperative. General Notes: Upon inspection patient's wounds again appear to be more excoriations over her arms as well as her right knee. I think that potentially the biggest issue right now is that they are somewhat dry and really need to have some debridement. With that being said I will think sharp debridement is the right way to go right now I think that we can probably see about utilizing some Santyl to see if we can soften things up. And get moving along. Integumentary (Hair, Skin) Wound #1 status is Open. Original cause of wound was Gradually Appeared. The date acquired was: 06/24/2020. The wound is located on the Right,Proximal Lower Leg. The wound measures 1.7cm length x 1.2cm width x 0.1cm depth; 1.602cm^2 area and 0.16cm^3 volume. There is Fat Layer (Subcutaneous Tissue) exposed. There is no tunneling or undermining noted. There is a small amount of serosanguineous drainage noted. There is small (1-33%) pink granulation within the wound bed. There is a large (67-100%) amount of necrotic tissue within the wound bed including Eschar. Wound #2 status is Open. Original cause of wound was Gradually Appeared. The date acquired was: 01/28/2021. The wound is located on the Right,Proximal Hand - Dorsum. The wound measures 2cm length x 1cm width x 0.1cm depth; 1.571cm^2 area and 0.157cm^3 volume. There is Fat Layer (Subcutaneous Tissue) exposed. There is no tunneling or undermining noted. There is a small amount of serosanguineous drainage noted. There is small  (1-33%) granulation within the wound bed. There is a large (67-100%) amount of necrotic tissue within the wound bed including Eschar. Assessment Active Problems ICD-10 Cellulitis of right lower limb Unspecified open wound, right lower leg, initial encounter Rash and other nonspecific skin eruption Unspecified open wound of right hand, initial encounter Plan Follow-up Appointments: Return Appointment in 2 weeks. Bathing/ Shower/ Hygiene: Wash wounds with antibacterial soap and water. The following medication(s) was prescribed: Santyl topical 250 unit/gram ointment ointment topical Apply nickel thick daily to the wound bed and Lori Pollard, [name]. ([id number]) then cover with a dressing as directed in clinic x 30 days starting 02/28/2021 WOUND #1: - Lower Leg Wound Laterality: Right, Proximal Cleanser: Soap and Water 1 x Per Day/15 Days Discharge Instructions: Gently cleanse wound with antibacterial soap, rinse and pat dry prior to dressing wounds  Topical: Santyl Collagenase Ointment, 30 (gm), tube 1 x Per Day/15 Days Discharge Instructions: apply nickel thick to wound bed only Secondary Dressing: Coverlet Latex-Free Fabric Adhesive Dressings 1 x Per Day/15 Days Discharge Instructions: 1.5 x 2 WOUND #2: - Hand - Dorsum Wound Laterality: Right, Proximal Cleanser: Soap and Water 1 x Per Day/15 Days Discharge Instructions: Gently cleanse wound with antibacterial soap, rinse and pat dry prior to dressing wounds Topical: Santyl Collagenase Ointment, 30 (gm), tube 1 x Per Day/15 Days Discharge Instructions: apply nickel thick to wound bed only Secondary Dressing: Coverlet Latex-Free Fabric Adhesive Dressings 1 x Per Day/15 Days Discharge Instructions: 1.5 x 2 1. I would recommend currently that we initiate treatment with Santyl topically change daily I discussed this with the patient should be use Band- Aids to cover. 2. I would also recommend that she wash this in the shower in between  using Dial antibacterial soap I think that is appropriate. 3. I am also can recommend that the patient go ahead and continue to monitor for any signs of worsening as far as her rash is concerned we will see how things go in that regard. 4. With regard to the concern about the possibility of scabies versus maggots I can definitely assure her that this is not an issue with maggots as they would be visible by the naked eye. With that being said I also think that scabies is unlikely considering that she has been it with the permethrin treatment at least twice maybe 3 times. Nonetheless if that were indeed the case I would expect that to have completely resolved by this point. Either way I do not really see necessarily evidence of that currently. We will see patient back for reevaluation in 2 weeks here in the clinic. If anything worsens or changes patient will contact our office for additional recommendations. Electronic Signature(s) Signed: 03/01/2021 9:25:55 AM By: Lenda Kelp PA-C Entered By: Lenda Kelp on 03/01/2021 09:25:54 Lori Pollard ([id number]) -------------------------------------------------------------------------------- ROS/PFSH Details Patient Name: Lori Gong D. Date of Service: 02/28/2021 3:00 PM Medical Record Number: [id number] Patient Account Number: [id number] Date of Birth/Sex: 1991/06/30 (30 y.o. F) Treating RN: Hansel Feinstein Primary Care Provider: SYSTEM, PCP Other Clinician: Lolita Cram Referring Provider: Antoine Primas Treating Provider/Extender: Rowan Blase in Treatment: 0 Information Obtained From Patient Constitutional Symptoms (General Health) Complaints and Symptoms: Negative for: Fatigue; Fever; Chills; Marked Weight Change Eyes Complaints and Symptoms: Negative for: Dry Eyes; Vision Changes; Glasses / Contacts Ear/Nose/Mouth/Throat Complaints and Symptoms: Positive for: Sinusitis Respiratory Complaints and  Symptoms: Negative for: Chronic or frequent coughs; Shortness of Breath Cardiovascular Complaints and Symptoms: Negative for: Chest pain; LE edema Endocrine Complaints and Symptoms: Negative for: Hepatitis; Thyroid disease; Polydypsia (Excessive Thirst) Genitourinary Complaints and Symptoms: Negative for: Kidney failure/ Dialysis; Incontinence/dribbling Musculoskeletal Complaints and Symptoms: Negative for: Muscle Pain; Muscle Weakness Neurologic Complaints and Symptoms: Negative for: Numbness/parasthesias; Focal/Weakness Psychiatric Complaints and Symptoms: Positive for: Anxiety Review of System Notes: Bipolar hx-taking no meds at all at this time states 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 and high anxiety and 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 per patient Hematologic/Lymphatic Lori Pollard, Lori Pollard ([id number]) Medical History: Positive for: Anemia Gastrointestinal Medical History: Positive for: Hepatitis C - tested but unsure if she may have i Integumentary (Skin) Complaints and Symptoms: Review of System Notes: says fine until 8/21 when skin issues/right leg abcess occurred and then anxiety Oncologic Immunizations Implantable Devices None Hospitalization / Surgery History Type of Hospitalization/Surgery cesarean x3 Family and Social History  Current every day smoker - started on 11/10/2004; Marital Status - Single; Alcohol Use: Never - hx abuse; Drug Use: Current History - cocaine current use; Caffeine Use: Daily; Financial Concerns: No; Food, Clothing or Shelter Needs: No; Support System Lacking: No; Transportation Concerns: No Electronic Signature(s) Signed: 02/28/2021 4:27:14 PM By: Hansel FeinsteinBishop, Joy Signed: 03/01/2021 1:09:49 PM By: Lenda KelpStone III, Lori Joswick PA-C Entered By: Hansel FeinsteinBishop, Joy on 02/28/2021 15:45:20 Lori Pollard, [name]. ([id number]) -------------------------------------------------------------------------------- SuperBill Details Patient Name: Lori Pollard, [name]. Date  of Service: 02/28/2021 Medical Record Number: [id number] Patient Account Number: [id number] Date of Birth/Sex: 25-Jul-1991 (30 y.o. F) Treating RN: Rogers BlockerSanchez, Kenia Primary Care Provider: SYSTEM, PCP Other Clinician: Lolita CramBurnette, Kyara Referring Provider: Antoine PrimasSmith, Zachary Treating Provider/Extender: Rowan BlaseStone, Hilmer Aliberti Weeks in Treatment: 0 Diagnosis Coding ICD-10 Codes Code Description L03.115 Cellulitis of right lower limb S81.801A Unspecified open wound, right lower leg, initial encounter R21 Rash and other nonspecific skin eruption S61.401A Unspecified open wound of right hand, initial encounter Facility Procedures CPT4 Code: [id number] Description: 99214 - WOUND CARE VISIT-LEV 4 EST PT Modifier: Quantity: 1 Physician Procedures CPT4 Code: [id number] Description: 99204 - WC PHYS LEVEL 4 - NEW PT Modifier: Quantity: 1 CPT4 Code: Description: ICD-10 Diagnosis Description L03.115 Cellulitis of right lower limb S81.801A Unspecified open wound, right lower leg, initial encounter R21 Rash and other nonspecific skin eruption S61.401A Unspecified open wound of right hand, initial  encounter Modifier: Quantity: Electronic Signature(s) Signed: 02/28/2021 5:39:35 PM By: Lenda KelpStone III, Mica Releford PA-C Previous Signature: 02/28/2021 5:15:58 PM Version By: Phillis HaggisSanchez Pereyda, Dondra PraderKenia RN Entered By: Lenda KelpStone III, Darnell Stimson on 02/28/2021 17:39:35

## 2021-03-11 ENCOUNTER — Other Ambulatory Visit: Payer: Self-pay

## 2021-03-11 ENCOUNTER — Encounter: Payer: Medicaid Other | Attending: Physician Assistant | Admitting: Physician Assistant

## 2021-03-11 DIAGNOSIS — L03115 Cellulitis of right lower limb: Secondary | ICD-10-CM | POA: Diagnosis not present

## 2021-03-11 NOTE — Progress Notes (Addendum)
Lori Pollard, Srah D. ([id number]) Visit Report for 03/11/2021 Chief Complaint Document Details Patient Name: Lori Pollard, [name] Date of Service: 03/11/2021 12:45 PM Medical Record Number: [id number] Patient Account Number: [id number] Date of Birth/Sex: 10/22/1991 (30 y.o. F) Treating RN: Hansel FeinsteinBishop, Joy Primary Care Provider: SYSTEM, PCP Other Clinician: Referring Provider: Antoine PrimasSmith, Zachary Treating Provider/Extender: Rowan BlaseStone, Cadie Sorci Weeks in Treatment: 1 Information Obtained from: Patient Chief Complaint Right LE and right wrist ulcers Electronic Signature(s) Signed: 03/11/2021 1:13:51 PM By: Lenda KelpStone III, Mohamedamin Nifong PA-C Entered By: Lenda KelpStone III, Meyer Arora on 03/11/2021 13:13:51 Gauntt, Forrestine D. ([id number]) -------------------------------------------------------------------------------- HPI Details Patient Name: Lori Pollard, [name] Date of Service: 03/11/2021 12:45 PM Medical Record Number: [id number] Patient Account Number: [id number] Date of Birth/Sex: 10/22/1991 (30 y.o. F) Treating RN: Hansel FeinsteinBishop, Joy Primary Care Provider: SYSTEM, PCP Other Clinician: Referring Provider: Antoine PrimasSmith, Zachary Treating Provider/Extender: Rowan BlaseStone, Sabriya Yono Weeks in Treatment: 1 History of Present Illness HPI Description: 03/01/2021 upon evaluation today patient presents for initial inspection here in our clinic concerning issues that she has been having with areas on her right knee as well as her arms and wrist and her legs which are excoriated. This appears to be something that may be unintentionally self-inflicted as she feels like she has had some type of "bugs or worms under her skin". She is concerned about maggots and/or scabies. She has been treated multiple times for scabies. With that being said the area on her knee in particular is a site where she tells me this started as a result of a needle injury which again I am unsure exactly what is going on in that regard. With that being said I think that the patient is in general  doing decently well with what I see I think most of the wounds that I see currently are probably more factitial in nature although I do not think this is intentional I think it is unintentional due to the fact that she is perceiving that there may be bugs under her skin. Patient was seen with her fianco in the office today as well. 03/11/2021 upon evaluation today patient appears to be doing poorly in regard to her skin in general. Fortunately there is no signs of active infection at this time locally or systemically. With that being said she continues to note that there is "something coming out of her skin that feels like a needle poking her". Subsequently she then tells me that she scratches the area vigorously which I think is what is causing the actual wounds and trauma currently. The areas that we have been treating with the Santyl do seem to be doing better. Fortunately there is no signs of overall worsening in that regard. Electronic Signature(s) Signed: 03/11/2021 1:31:57 PM By: Lenda KelpStone III, Hadley Detloff PA-C Entered By: Lenda KelpStone III, Kristjan Derner on 03/11/2021 13:31:56 Lori Pollard, [name]. ([id number]) -------------------------------------------------------------------------------- Physical Exam Details Patient Name: Lori Pollard, [name]. Date of Service: 03/11/2021 12:45 PM Medical Record Number: [id number] Patient Account Number: [id number] Date of Birth/Sex: 10/22/1991 (30 y.o. F) Treating RN: Hansel FeinsteinBishop, Joy Primary Care Provider: SYSTEM, PCP Other Clinician: Referring Provider: Antoine PrimasSmith, Zachary Treating Provider/Extender: Rowan BlaseStone, Janoah Menna Weeks in Treatment: 1 Constitutional Well-nourished and well-hydrated in no acute distress. Respiratory normal breathing without difficulty. Psychiatric this patient is able to make decisions and demonstrates good insight into disease process. Alert and Oriented x 3. pleasant and cooperative. Notes Upon inspection patient's wound bed actually showed signs of good granulation  epithelization at this point. There does not appear to be any signs of infection she does have new  excoriations on the lower extremities which is unfortunate. In general however I feel like the patient is not doing as well as I would like to see just due to the fact that she does have new areas that she has excoriated. Electronic Signature(s) Signed: 03/11/2021 1:32:19 PM By: Lenda Kelp PA-C Entered By: Lenda Kelp on 03/11/2021 13:32:18 TISA, WEISEL ([id number]) -------------------------------------------------------------------------------- Physician Orders Details Patient Name: Lori Gong D. Date of Service: 03/11/2021 12:45 PM Medical Record Number: [id number] Patient Account Number: [id number] Date of Birth/Sex: Dec 16, 1990 (30 y.o. F) Treating RN: Hansel Feinstein Primary Care Provider: SYSTEM, PCP Other Clinician: Referring Provider: Antoine Primas Treating Provider/Extender: Rowan Blase in Treatment: 1 Verbal / Phone Orders: No Diagnosis Coding ICD-10 Coding Code Description L03.115 Cellulitis of right lower limb S81.801A Unspecified open wound, right lower leg, initial encounter R21 Rash and other nonspecific skin eruption S61.401A Unspecified open wound of right hand, initial encounter Follow-up Appointments o Return Appointment in 1 week. Bathing/ Shower/ Hygiene o Wash wounds with antibacterial soap and water. Wound Treatment Wound #1 - Lower Leg Wound Laterality: Right, Proximal Cleanser: Soap and Water 1 x Per Day/15 Days Discharge Instructions: Gently cleanse wound with antibacterial soap, rinse and pat dry prior to dressing wounds Topical: Santyl Collagenase Ointment, 30 (gm), tube 1 x Per Day/15 Days Discharge Instructions: apply nickel thick to wound bed only Secondary Dressing: Coverlet Latex-Free Fabric Adhesive Dressings 1 x Per Day/15 Days Discharge Instructions: 1.5 x 2 Wound #2 - Hand - Dorsum Wound Laterality: Right,  Proximal Cleanser: Soap and Water 1 x Per Day/15 Days Discharge Instructions: Gently cleanse wound with antibacterial soap, rinse and pat dry prior to dressing wounds Topical: Santyl Collagenase Ointment, 30 (gm), tube 1 x Per Day/15 Days Discharge Instructions: apply nickel thick to wound bed only Secondary Dressing: Coverlet Latex-Free Fabric Adhesive Dressings 1 x Per Day/15 Days Discharge Instructions: 1.5 x 2 Consults o Dermatology - make appt at dermatology-states prefers [facility] Electronic Signature(s) Signed: 03/11/2021 1:25:17 PM By: Hansel Feinstein Signed: 03/11/2021 5:38:17 PM By: Lenda Kelp PA-C Entered By: Hansel Feinstein on 03/11/2021 13:25:16 Settle, Anwyn D. ([id number]) -------------------------------------------------------------------------------- Problem List Details Patient Name: Lori Gong D. Date of Service: 03/11/2021 12:45 PM Medical Record Number: [id number] Patient Account Number: [id number] Date of Birth/Sex: Jun 22, 1991 (29 y.o. F) Treating RN: Hansel Feinstein Primary Care Provider: SYSTEM, PCP Other Clinician: Referring Provider: Antoine Primas Treating Provider/Extender: Rowan Blase in Treatment: 1 Active Problems ICD-10 Encounter Code Description Active Date MDM Diagnosis L03.115 Cellulitis of right lower limb 02/28/2021 No Yes S81.801A Unspecified open wound, right lower leg, initial encounter 02/28/2021 No Yes R21 Rash and other nonspecific skin eruption 02/28/2021 No Yes S61.401A Unspecified open wound of right hand, initial encounter 02/28/2021 No Yes Inactive Problems Resolved Problems Electronic Signature(s) Signed: 03/11/2021 1:13:43 PM By: Lenda Kelp PA-C Entered By: Lenda Kelp on 03/11/2021 13:13:43 Sgroi, Waylynn D. ([id number]) -------------------------------------------------------------------------------- Progress Note Details Patient Name: Lori Gong D. Date of Service: 03/11/2021 12:45  PM Medical Record Number: [id number] Patient Account Number: [id number] Date of Birth/Sex: 03/08/1991 (29 y.o. F) Treating RN: Hansel Feinstein Primary Care Provider: SYSTEM, PCP Other Clinician: Referring Provider: Antoine Primas Treating Provider/Extender: Rowan Blase in Treatment: 1 Subjective Chief Complaint Information obtained from Patient Right LE and right wrist ulcers History of Present Illness (HPI) 03/01/2021 upon evaluation today patient presents for initial inspection here in our clinic concerning issues that she has been having with areas on her right knee as well  as her arms and wrist and her legs which are excoriated. This appears to be something that may be unintentionally self- inflicted as she feels like she has had some type of "bugs or worms under her skin". She is concerned about maggots and/or scabies. She has been treated multiple times for scabies. With that being said the area on her knee in particular is a site where she tells me this started as a result of a needle injury which again I am unsure exactly what is going on in that regard. With that being said I think that the patient is in general doing decently well with what I see I think most of the wounds that I see currently are probably more factitial in nature although I do not think this is intentional I think it is unintentional due to the fact that she is perceiving that there may be bugs under her skin. Patient was seen with her fianc in the office today as well. 03/11/2021 upon evaluation today patient appears to be doing poorly in regard to her skin in general. Fortunately there is no signs of active infection at this time locally or systemically. With that being said she continues to note that there is "something coming out of her skin that feels like a needle poking her". Subsequently she then tells me that she scratches the area vigorously which I think is what is causing the actual wounds and trauma  currently. The areas that we have been treating with the Santyl do seem to be doing better. Fortunately there is no signs of overall worsening in that regard. Objective Constitutional Well-nourished and well-hydrated in no acute distress. Vitals Time Taken: 12:54 PM, Height: 59 in, Weight: 98 lbs, BMI: 19.8, Temperature: 98 F, Pulse: 90 bpm, Respiratory Rate: 18 breaths/min, Blood Pressure: 109/70 mmHg. Respiratory normal breathing without difficulty. Psychiatric this patient is able to make decisions and demonstrates good insight into disease process. Alert and Oriented x 3. pleasant and cooperative. General Notes: Upon inspection patient's wound bed actually showed signs of good granulation epithelization at this point. There does not appear to be any signs of infection she does have new excoriations on the lower extremities which is unfortunate. In general however I feel like the patient is not doing as well as I would like to see just due to the fact that she does have new areas that she has excoriated. Integumentary (Hair, Skin) Wound #1 status is Open. Original cause of wound was Gradually Appeared. The date acquired was: 06/24/2020. The wound has been in treatment 1 weeks. The wound is located on the Right,Proximal Lower Leg. The wound measures 1.3cm length x 1.1cm width x 0.1cm depth; 1.123cm^2 area and 0.112cm^3 volume. There is Fat Layer (Subcutaneous Tissue) exposed. There is no tunneling or undermining noted. There is a none present amount of drainage noted. There is no granulation within the wound bed. There is a large (67-100%) amount of necrotic tissue within the wound bed including Eschar and Adherent Slough. Wound #2 status is Open. Original cause of wound was Gradually Appeared. The date acquired was: 01/28/2021. The wound has been in treatment 1 weeks. The wound is located on the Right,Proximal Hand - Dorsum. The wound measures 5cm length x 2.1cm width x 0.1cm depth;  8.247cm^2 area and 0.825cm^3 volume. There is no tunneling or undermining noted. There is a none present amount of drainage noted. There is no granulation within the wound bed. There is a large (67-100%) amount of necrotic tissue  within the wound bed including Eschar and Adherent Slough. BETHA, SHADIX ([id number]) Assessment Active Problems ICD-10 Cellulitis of right lower limb Unspecified open wound, right lower leg, initial encounter Rash and other nonspecific skin eruption Unspecified open wound of right hand, initial encounter Plan Follow-up Appointments: Return Appointment in 1 week. Bathing/ Shower/ Hygiene: Wash wounds with antibacterial soap and water. Consults ordered were: Dermatology - make appt at dermatology-states prefers [facility] WOUND #1: - Lower Leg Wound Laterality: Right, Proximal Cleanser: Soap and Water 1 x Per Day/15 Days Discharge Instructions: Gently cleanse wound with antibacterial soap, rinse and pat dry prior to dressing wounds Topical: Santyl Collagenase Ointment, 30 (gm), tube 1 x Per Day/15 Days Discharge Instructions: apply nickel thick to wound bed only Secondary Dressing: Coverlet Latex-Free Fabric Adhesive Dressings 1 x Per Day/15 Days Discharge Instructions: 1.5 x 2 WOUND #2: - Hand - Dorsum Wound Laterality: Right, Proximal Cleanser: Soap and Water 1 x Per Day/15 Days Discharge Instructions: Gently cleanse wound with antibacterial soap, rinse and pat dry prior to dressing wounds Topical: Santyl Collagenase Ointment, 30 (gm), tube 1 x Per Day/15 Days Discharge Instructions: apply nickel thick to wound bed only Secondary Dressing: Coverlet Latex-Free Fabric Adhesive Dressings 1 x Per Day/15 Days Discharge Instructions: 1.5 x 2 1. Would recommend currently that we going to continue with the wound care measures as before the patient is in agreement with plan that includes the use of the Santyl ointment topically. 2. I am also  can recommend Band-Aids to cover the areas. 3. I am also going to suggest that she continue to clean the area with mild soap and water such as Dial antibacterial soap. 4. I am also can recommend a referral to [facility] dermatology as does seem to have something that she is concerned about from a dermatologic standpoint but again is not really specifically wound related wounds are coming from the excoriations but there is something underlying this significantly which is causing her to excoriate. She voiced an understanding and a willingness and desire to be referred to dermatology for further evaluation in that regard. We will see patient back for reevaluation in 1 week here in the clinic. If anything worsens or changes patient will contact our office for additional recommendations. Electronic Signature(s) Signed: 03/11/2021 1:33:15 PM By: Lenda Kelp PA-C Entered By: Lenda Kelp on 03/11/2021 13:33:15 Fariss, Kailen D. ([id number]) -------------------------------------------------------------------------------- SuperBill Details Patient Name: Lori Gong D. Date of Service: 03/11/2021 Medical Record Number: [id number] Patient Account Number: [id number] Date of Birth/Sex: Sep 14, 1991 (29 y.o. F) Treating RN: Hansel Feinstein Primary Care Provider: SYSTEM, PCP Other Clinician: Referring Provider: Antoine Primas Treating Provider/Extender: Rowan Blase in Treatment: 1 Diagnosis Coding ICD-10 Codes Code Description L03.115 Cellulitis of right lower limb S81.801A Unspecified open wound, right lower leg, initial encounter R21 Rash and other nonspecific skin eruption S61.401A Unspecified open wound of right hand, initial encounter Facility Procedures CPT4 Code: [id number] Description: 99213 - WOUND CARE VISIT-LEV 3 EST PT Modifier: Quantity: 1 Physician Procedures CPT4 Code: [id number] Description: 99213 - WC PHYS LEVEL 3 - EST PT Modifier: Quantity: 1 CPT4 Code: Description:  ICD-10 Diagnosis Description L03.115 Cellulitis of right lower limb S81.801A Unspecified open wound, right lower leg, initial encounter R21 Rash and other nonspecific skin eruption S61.401A Unspecified open wound of right hand, initial  encounter Modifier: Quantity: Electronic Signature(s) Signed: 03/11/2021 1:33:28 PM By: Lenda Kelp PA-C Entered By: Lenda Kelp on 03/11/2021 13:33:27

## 2021-03-12 NOTE — Progress Notes (Addendum)
Lori Pollard, [name]. ([id number]) Visit Report for 03/11/2021 Arrival Information Details Patient Name: Lori Pollard, [name]. Date of Service: 03/11/2021 12:45 PM Medical Record Number: [id number] Patient Account Number: [id number] Date of Birth/Sex: [birth year] (29 y.o. F) Treating RN: Lori Pollard Primary Care Lori Pollard: SYSTEM, PCP Other Clinician: Referring Lori Pollard: Lori Pollard, Lori Treating Lori Pollard/Extender: Lori Pollard, Lori Pollard in Treatment: 1 Visit Information History Since Last Visit All ordered tests and consults were completed: No Patient Arrived: Ambulatory Added or deleted any medications: No Arrival Time: 12:50 Any new allergies or adverse reactions: No Accompanied By: self Had a fall or experienced change in No Transfer Assistance: None activities of daily living that may affect Patient Identification Verified: Yes risk of falls: Secondary Verification Process Completed: Yes Signs or symptoms of abuse/neglect since last visito No Patient Has Alerts: Yes Hospitalized since last visit: No Patient Alerts: NOT diabetic Implantable device outside of the clinic excluding No cellular tissue based products placed in the center since last visit: Has Dressing in Place as Prescribed: No Pain Present Now: Yes Electronic Signature(s) Signed: 03/11/2021 5:22:07 PM By: Lori PaxEpps, Carrie RN Entered By: Lori Pollard on 03/11/2021 12:54:21 Lori Pollard, [name]. ([id number]) -------------------------------------------------------------------------------- Clinic Level of Care Assessment Details Patient Name: Lori GongJOHNSTON, [name]. Date of Service: 03/11/2021 12:45 PM Medical Record Number: [id number] Patient Account Number: [id number] Date of Birth/Sex: [birth year] (29 y.o. F) Treating RN: Lori Pollard Primary Care Seaton Hofmann: SYSTEM, PCP Other Clinician: Referring Rionna Feltes: Lori Pollard, Lori Treating Nyomie Ehrlich/Extender: Lori Pollard, Lori Pollard in Treatment: 1 Clinic Level of Care Assessment Items TOOL 4  Quantity Score X - Use when only an EandM is performed on FOLLOW-UP visit 1 0 ASSESSMENTS - Nursing Assessment / Reassessment []  - Reassessment of Co-morbidities (includes updates in patient status) 0 []  - 0 Reassessment of Adherence to Treatment Plan ASSESSMENTS - Wound and Skin Assessment / Reassessment []  - Simple Wound Assessment / Reassessment - one wound 0 X- 1 5 Complex Wound Assessment / Reassessment - multiple wounds []  - 0 Dermatologic / Skin Assessment (not related to wound area) ASSESSMENTS - Focused Assessment []  - Circumferential Edema Measurements - multi extremities 0 []  - 0 Nutritional Assessment / Counseling / Intervention []  - 0 Lower Extremity Assessment (monofilament, tuning fork, pulses) []  - 0 Peripheral Arterial Disease Assessment (using hand held doppler) ASSESSMENTS - Ostomy and/or Continence Assessment and Care []  - Incontinence Assessment and Management 0 []  - 0 Ostomy Care Assessment and Management (repouching, etc.) PROCESS - Coordination of Care X - Simple Patient / Family Education for ongoing care 1 15 []  - 0 Complex (extensive) Patient / Family Education for ongoing care []  - 0 Staff obtains ChiropractorConsents, Records, Test Results / Process Orders []  - 0 Staff telephones HHA, Nursing Homes / Clarify orders / etc []  - 0 Routine Transfer to another Facility (non-emergent condition) []  - 0 Routine Hospital Admission (non-emergent condition) []  - 0 New Admissions / Manufacturing engineernsurance Authorizations / Ordering NPWT, Apligraf, etc. []  - 0 Emergency Hospital Admission (emergent condition) X- 1 10 Simple Discharge Coordination []  - 0 Complex (extensive) Discharge Coordination PROCESS - Special Needs []  - Pediatric / Minor Patient Management 0 []  - 0 Isolation Patient Management []  - 0 Hearing / Language / Visual special needs []  - 0 Assessment of Community assistance (transportation, D/C planning, etc.) []  - 0 Additional assistance / Altered mentation []   - 0 Support Surface(s) Assessment (bed, cushion, seat, etc.) INTERVENTIONS - Wound Cleansing / Measurement Skalicky, [name] ([id number]) []  - 0 Simple Wound Cleansing - one wound X-  2 5 Complex Wound Cleansing - multiple wounds []  - 0 Wound Imaging (photographs - any number of wounds) []  - 0 Wound Tracing (instead of photographs) []  - 0 Simple Wound Measurement - one wound X- 2 5 Complex Wound Measurement - multiple wounds INTERVENTIONS - Wound Dressings []  - Small Wound Dressing one or multiple wounds 0 X- 2 15 Medium Wound Dressing one or multiple wounds []  - 0 Large Wound Dressing one or multiple wounds []  - 0 Application of Medications - topical []  - 0 Application of Medications - injection INTERVENTIONS - Miscellaneous []  - External ear exam 0 []  - 0 Specimen Collection (cultures, biopsies, blood, body fluids, etc.) []  - 0 Specimen(s) / Culture(s) sent or taken to Lab for analysis []  - 0 Patient Transfer (multiple staff / / Similar devices) []  - 0 Simple Staple / Suture removal (25 or less) []  - 0 Complex Staple / Suture removal (26 or more) []  - 0 Hypo / Hyperglycemic Management (close monitor of Blood Glucose) []  - 0 Ankle / Brachial Index (ABI) - do not check if billed separately X- 1 5 Vital Signs Has the patient been seen at the hospital within the last three years: Yes Total Score: 85 Level Of Care: New/Established - Level 3 Electronic Signature(s) Signed: 03/12/2021 8:24:39 AM By: Entered By: on 03/11/2021 13:21:17 Lori Pollard, [name]. ( ) -------------------------------------------------------------------------------- Encounter Discharge Information Details Patient Name: D. Date of Service: 03/11/2021 12:45 PM Medical Record Number: Patient Account Number: Date of Birth/Sex: 04-Sep-1991 (29 y.o. F) Treating RN: Nurse, adult Primary Care Shanine Kreiger: SYSTEM, PCP  Other Clinician: Referring Teena Mangus: Treating Zanyiah Posten/Extender: in Treatment: 1 Encounter Discharge Information Items Discharge Condition: Stable Ambulatory Status: Ambulatory Discharge Destination: Home Transportation: Private Auto Accompanied By: boyfriend Schedule Follow-up Appointment: Yes Clinical Summary of Care: Electronic Signature(s) Signed: 03/11/2021 5:22:07 PM By: RN Entered By: 05/12/2021 on 03/11/2021 13:29:58 Lori Pollard, [name]. (Lori Feinstein) -------------------------------------------------------------------------------- Lower Extremity Assessment Details Patient Name: 05/11/2021 D. Date of Service: 03/11/2021 12:45 PM Medical Record Number: Lori Pollard Patient Account Number: 05/11/2021 Date of Birth/Sex: 18-May-1991 (29 y.o. F) Treating RN: [id number] Primary Care Demetrios Byron: SYSTEM, PCP Other Clinician: Referring Shakeia Krus: 06/19/1991 Treating Lynnleigh Soden/Extender: Lori Pax Pollard in Treatment: 1 Electronic Signature(s) Signed: 03/11/2021 5:22:07 PM By: Lori Blase RN Entered By: 05/11/2021 on 03/11/2021 13:01:26 Breaker, Latangela D. (Lori Pax) -------------------------------------------------------------------------------- Multi Wound Chart Details Patient Name: 05/11/2021 D. Date of Service: 03/11/2021 12:45 PM Medical Record Number: Lori Pollard Patient Account Number: 05/11/2021 Date of Birth/Sex: 1991-05-07 (29 y.o. F) Treating RN: [id number] Primary Care Dashanna Kinnamon: SYSTEM, PCP Other Clinician: Referring Sidnee Gambrill: 06/19/1991 Treating Carrel Leather/Extender: Lori Pax in Treatment: 1 Vital Signs Height(in): 59 Pulse(bpm): 90 Weight(lbs): 98 Blood Pressure(mmHg): 109/70 Body Mass Index(BMI): 20 Temperature(F): 98 Respiratory Rate(breaths/min): 18 Photos: [N/A:N/A] Wound Location: Right, Proximal Lower Leg Right, Proximal Hand - Dorsum N/A Wounding Event: Gradually Appeared  Gradually Appeared N/A Primary Etiology: Abscess Factitial N/A Comorbid History: Anemia, Hepatitis C Anemia, Hepatitis C N/A Date Acquired: 06/24/2020 01/28/2021 N/A Pollard of Treatment: 1 1 N/A Wound Status: Open Open N/A Measurements L x W x D (cm) 1.3x1.1x0.1 5x2.1x0.1 N/A Area (cm) : 1.123 8.247 N/A Volume (cm) : 0.112 0.825 N/A % Reduction in Area: 29.90% -425.00% N/A % Reduction in Volume: 30.00% -425.50% N/A Classification: Full Thickness Without Exposed Full Thickness Without Exposed N/A Support Structures Support Structures Exudate Amount: None Present None Present N/A  Granulation Amount: None Present (0%) None Present (0%) N/A Necrotic Amount: Large (67-100%) Large (67-100%) N/A Necrotic Tissue: Eschar, Adherent Slough Eschar, Adherent Slough N/A Exposed Structures: Fat Layer (Subcutaneous Tissue): Fascia: No N/A Yes Fat Layer (Subcutaneous Tissue): Fascia: No No Tendon: No Tendon: No Muscle: No Muscle: No Joint: No Joint: No Bone: No Bone: No Epithelialization: Small (1-33%) None N/A Treatment Notes Electronic Signature(s) Signed: 03/12/2021 8:24:39 AM By: Lori Feinstein Entered By: Lori Feinstein on 03/11/2021 13:15:47 Lori Pollard, [name]. ([id number]) -------------------------------------------------------------------------------- Multi-Disciplinary Care Plan Details Patient Name: Lori Pollard D. Date of Service: 03/11/2021 12:45 PM Medical Record Number: [id number] Patient Account Number: [id number] Date of Birth/Sex: August 02, 1991 (29 y.o. F) Treating RN: Lori Feinstein Primary Care Dawnelle Warman: SYSTEM, PCP Other Clinician: Referring Leahanna Buser: Lori Primas Treating Ahni Bradwell/Extender: Lori Blase in Treatment: 1 Active Inactive Electronic Signature(s) Signed: 04/23/2021 1:23:04 PM By: Elliot Gurney BSN, RN, CWS, Kim RN, BSN Signed: 06/05/2021 3:37:55 PM By: Lori Feinstein Previous Signature: 03/12/2021 3:22:01 PM Version By: Lori Feinstein Previous Signature: 03/12/2021  8:24:39 AM Version By: Lori Feinstein Entered By: Elliot Gurney, BSN, RN, CWS, Kim on 04/23/2021 13:23:03 Lori Pollard, Lori DMarland Kitchen ([id number]) -------------------------------------------------------------------------------- Pain Assessment Details Patient Name: Lori Pollard, [name]. Date of Service: 03/11/2021 12:45 PM Medical Record Number: [id number] Patient Account Number: [id number] Date of Birth/Sex: 01-Jun-1991 (29 y.o. F) Treating RN: Lori Pax Primary Care Bingham Millette: SYSTEM, PCP Other Clinician: Referring Arber Wiemers: Lori Primas Treating Yvonne Petite/Extender: Lori Blase in Treatment: 1 Active Problems Location of Pain Severity and Description of Pain Patient Has Paino Yes Site Locations With Dressing Change: Yes Duration of the Pain. Constant / Intermittento Constant Rate the pain. Current Pain Level: 8 Worst Pain Level: 10 Least Pain Level: 6 Character of Pain Describe the Pain: Burning, Other: sting Pain Management and Medication Current Pain Management: Medication: Yes Cold Application: No Rest: Yes Massage: No Activity: No T.E.N.S.: No Heat Application: No Leg drop or elevation: No Is the Current Pain Management Adequate: Inadequate How does your wound impact your activities of daily livingo Sleep: Yes Bathing: No Appetite: Yes Relationship With Others: No Bladder Continence: No Emotions: No Bowel Continence: No Work: No Toileting: No Drive: No Dressing: No Hobbies: No Electronic Signature(s) Signed: 03/11/2021 5:22:07 PM By: Lori Pax RN Entered By: Lori Pax on 03/11/2021 12:55:12 Lori Pollard ([id number]) -------------------------------------------------------------------------------- Patient/Caregiver Education Details Patient Name: Lori Pollard D. Date of Service: 03/11/2021 12:45 PM Medical Record Number: [id number] Patient Account Number: [id number] Date of Birth/Gender: Mar 04, 1991 (29 y.o. F) Treating RN: Lori Feinstein Primary Care  Physician: SYSTEM, PCP Other Clinician: Referring Physician: Antoine Primas Treating Physician/Extender: Lori Blase in Treatment: 1 Education Assessment Education Provided To: Patient Education Topics Provided Basic Hygiene: Methods: Explain/Verbal Responses: State content correctly Wound/Skin Impairment: Handouts: Caring for Your Ulcer Methods: Demonstration, Explain/Verbal Responses: State content correctly Electronic Signature(s) Signed: 03/12/2021 8:24:39 AM By: Lori Feinstein Entered By: Lori Feinstein on 03/11/2021 13:16:38 Lori Pollard, [name]. ([id number]) -------------------------------------------------------------------------------- Wound Assessment Details Patient Name: Lori Pollard D. Date of Service: 03/11/2021 12:45 PM Medical Record Number: [id number] Patient Account Number: [id number] Date of Birth/Sex: [birth year] (29 y.o. F) Treating RN: Lori Pax Primary Care Aalayah Riles: SYSTEM, PCP Other Clinician: Referring Der Gagliano: Lori Primas Treating Gaspard Isbell/Extender: Lori Blase in Treatment: 1 Wound Status Wound Number: 1 Primary Etiology: Abscess Wound Location: Right, Proximal Lower Leg Wound Status: Open Wounding Event: Gradually Appeared Comorbid History: Anemia, Hepatitis C Date Acquired: 06/24/2020 Pollard Of Treatment: 1 Clustered Wound: No Photos Wound Measurements Length: (cm) 1.3 Width: (cm) 1.1 Depth: (cm) 0.1  Area: (cm) 1.123 Volume: (cm) 0.112 % Reduction in Area: 29.9% % Reduction in Volume: 30% Epithelialization: Small (1-33%) Tunneling: No Undermining: No Wound Description Classification: Full Thickness Without Exposed Support Structure Exudate Amount: None Present s Foul Lori Pollard After Cleansing: No Slough/Fibrino Yes Wound Bed Granulation Amount: None Present (0%) Exposed Structure Necrotic Amount: Large (67-100%) Fascia Exposed: No Necrotic Quality: Eschar, Adherent Slough Fat Layer (Subcutaneous Tissue) Exposed:  Yes Tendon Exposed: No Muscle Exposed: No Joint Exposed: No Bone Exposed: No Electronic Signature(s) Signed: 03/11/2021 5:22:07 PM By: Lori Pax RN Entered By: Lori Pax on 03/11/2021 13:00:44 Lori Pollard, [name]. ([id number]) -------------------------------------------------------------------------------- Wound Assessment Details Patient Name: Lori Pollard D. Date of Service: 03/11/2021 12:45 PM Medical Record Number: [id number] Patient Account Number: [id number] Date of Birth/Sex: 02/17/1991 (29 y.o. F) Treating RN: Lori Pax Primary Care Andrik Sandt: SYSTEM, PCP Other Clinician: Referring Cassadi Purdie: Lori Primas Treating Nohea Kras/Extender: Lori Blase in Treatment: 1 Wound Status Wound Number: 2 Primary Etiology: Factitial Wound Location: Right, Proximal Hand - Dorsum Wound Status: Open Wounding Event: Gradually Appeared Comorbid History: Anemia, Hepatitis C Date Acquired: 01/28/2021 Pollard Of Treatment: 1 Clustered Wound: No Photos Wound Measurements Length: (cm) 5 Width: (cm) 2.1 Depth: (cm) 0.1 Area: (cm) 8.247 Volume: (cm) 0.825 % Reduction in Area: -425% % Reduction in Volume: -425.5% Epithelialization: None Tunneling: No Undermining: No Wound Description Classification: Full Thickness Without Exposed Support Structure Exudate Amount: None Present s Foul Lori Pollard After Cleansing: No Slough/Fibrino Yes Wound Bed Granulation Amount: None Present (0%) Exposed Structure Necrotic Amount: Large (67-100%) Fascia Exposed: No Necrotic Quality: Eschar, Adherent Slough Fat Layer (Subcutaneous Tissue) Exposed: No Tendon Exposed: No Muscle Exposed: No Joint Exposed: No Bone Exposed: No Electronic Signature(s) Signed: 03/11/2021 5:22:07 PM By: Lori Pax RN Entered By: Lori Pax on 03/11/2021 13:01:12 Lori Pollard, [name]. ([id number]) -------------------------------------------------------------------------------- Vitals Details Patient Name:  Lori Pollard D. Date of Service: 03/11/2021 12:45 PM Medical Record Number: [id number] Patient Account Number: [id number] Date of Birth/Sex: 10/08/1991 (29 y.o. F) Treating RN: Lori Pax Primary Care Jamerion Cabello: SYSTEM, PCP Other Clinician: Referring Andrian Urbach: Lori Primas Treating Calyse Murcia/Extender: Lori Blase in Treatment: 1 Vital Signs Time Taken: 12:54 Temperature (F): 98 Height (in): 59 Pulse (bpm): 90 Weight (lbs): 98 Respiratory Rate (breaths/min): 18 Body Mass Index (BMI): 19.8 Blood Pressure (mmHg): 109/70 Reference Range: 80 - 120 mg / dl Electronic Signature(s) Signed: 03/11/2021 5:22:07 PM By: Lori Pax RN Entered By: Lori Pax on 03/11/2021 12:54:39

## 2021-03-13 ENCOUNTER — Emergency Department
Admission: EM | Admit: 2021-03-13 | Discharge: 2021-03-13 | Disposition: A | Discharge status: Home / Self Care | Payer: Medicaid Other | Attending: Emergency Medicine | Admitting: Emergency Medicine

## 2021-03-13 ENCOUNTER — Other Ambulatory Visit: Payer: Self-pay

## 2021-03-13 DIAGNOSIS — R21 Rash and other nonspecific skin eruption: Secondary | ICD-10-CM | POA: Diagnosis not present

## 2021-03-13 DIAGNOSIS — X58XXXA Exposure to other specified factors, initial encounter: Secondary | ICD-10-CM | POA: Diagnosis not present

## 2021-03-13 DIAGNOSIS — S80812A Abrasion, left lower leg, initial encounter: Secondary | ICD-10-CM | POA: Insufficient documentation

## 2021-03-13 DIAGNOSIS — S8992XA Unspecified injury of left lower leg, initial encounter: Secondary | ICD-10-CM | POA: Diagnosis present

## 2021-03-13 DIAGNOSIS — Z5321 Procedure and treatment not carried out due to patient leaving prior to being seen by health care provider: Secondary | ICD-10-CM | POA: Insufficient documentation

## 2021-03-13 NOTE — ED Triage Notes (Signed)
See first nurse note- Pt arrives with rash to bilateral extremities, arms and legs, back of neck and middle of back. States she scratches them during the night. Areas are not painful. Pt had abscess drained august 2021, was told she had cellulitis. Pt goes to wound clinic. Applies ointment prescribed to areas with no improvement to rash over this period. Last appointment was Friday.

## 2021-03-13 NOTE — ED Triage Notes (Signed)
First Nurse Note:  Arrives with 6 months of rash to body.  Describes itchiness to areas.  Currently left calf-- area looks scratched similar to an abrasion/ road rash.  DSD applied.

## 2021-03-14 ENCOUNTER — Ambulatory Visit: Payer: Medicaid Other | Admitting: Physician Assistant

## 2021-03-18 ENCOUNTER — Ambulatory Visit: Payer: Medicaid Other | Admitting: Physician Assistant

## 2021-03-24 ENCOUNTER — Emergency Department
Admission: EM | Admit: 2021-03-24 | Discharge: 2021-03-24 | Disposition: A | Discharge status: Home / Self Care | Payer: Medicaid Other

## 2021-03-26 ENCOUNTER — Ambulatory Visit: Payer: Medicaid Other | Admitting: Physician Assistant

## 2021-03-27 ENCOUNTER — Ambulatory Visit: Payer: Medicaid Other | Admitting: Internal Medicine

## 2021-04-01 ENCOUNTER — Ambulatory Visit: Payer: Medicaid Other | Admitting: Internal Medicine

## 2021-05-02 ENCOUNTER — Encounter: Payer: Self-pay | Admitting: Obstetrics and Gynecology

## 2021-05-02 ENCOUNTER — Ambulatory Visit (INDEPENDENT_AMBULATORY_CARE_PROVIDER_SITE_OTHER): Payer: Medicaid Other | Admitting: Obstetrics and Gynecology

## 2021-05-02 ENCOUNTER — Other Ambulatory Visit: Payer: Self-pay

## 2021-05-02 VITALS — BP 96/68 | Ht 59.0 in | Wt 87.4 lb

## 2021-05-02 DIAGNOSIS — Z3009 Encounter for other general counseling and advice on contraception: Secondary | ICD-10-CM

## 2021-05-02 DIAGNOSIS — Z0000 Encounter for general adult medical examination without abnormal findings: Secondary | ICD-10-CM | POA: Diagnosis not present

## 2021-05-02 DIAGNOSIS — Z3200 Encounter for pregnancy test, result unknown: Secondary | ICD-10-CM

## 2021-05-02 DIAGNOSIS — Z3202 Encounter for pregnancy test, result negative: Secondary | ICD-10-CM

## 2021-05-02 DIAGNOSIS — R875 Abnormal microbiological findings in specimens from female genital organs: Secondary | ICD-10-CM

## 2021-05-02 DIAGNOSIS — B3731 Acute candidiasis of vulva and vagina: Secondary | ICD-10-CM

## 2021-05-02 DIAGNOSIS — Z113 Encounter for screening for infections with a predominantly sexual mode of transmission: Secondary | ICD-10-CM

## 2021-05-02 DIAGNOSIS — B373 Candidiasis of vulva and vagina: Secondary | ICD-10-CM

## 2021-05-02 LAB — POCT URINE PREGNANCY: Preg Test, Ur: NEGATIVE

## 2021-05-02 MED ORDER — MEDROXYPROGESTERONE ACETATE 150 MG/ML IM SUSP: 150.0000 mg | INTRAMUSCULAR | 0 refills | Status: DC

## 2021-05-02 MED ORDER — SULFAMETHOXAZOLE-TRIMETHOPRIM 800-160 MG PO TABS: 1.0000 | ORAL_TABLET | Freq: Two times a day (BID) | ORAL | 0 refills | Status: AC

## 2021-05-02 MED ORDER — FLUCONAZOLE 150 MG PO TABS: 150.0000 mg | ORAL_TABLET | ORAL | 5 refills | Status: AC

## 2021-05-02 MED ORDER — SULFAMETHOXAZOLE-TRIMETHOPRIM 800-160 MG PO TABS: 1.0000 | ORAL_TABLET | Freq: Two times a day (BID) | ORAL | 1 refills | Status: DC

## 2021-05-02 NOTE — Patient Instructions (Addendum)
1. [facility]:  Agency that provides counseling, psychiatry, and substance abuse treatment services. They offer telehealth. Phone number is [phone] and they are located at [address] [place], [place] [address]. AntiagingAlternatives.com.cy   2. [facility]: Managed Care Agency that can connect clients with to local counseling, psychiatry, and substance abuse treatment services. Call [phone] for referrals to providers in your area. That number is also a 24/7 crisis line that can be used in a mental health crisis. https://russo.net/   2. [facility]: counseling agency in [place], call [phone] for information and scheduling. The offer telehealth. They accept Medicaid and [employer]. https://www.[email or web address]/   3. [facility]: Agency in [place] that offers counseling, psychiatry, and substance abuse treatment services. [phone] I am not sure if they are still taking Medicaid or not.   5. [facility]: Offers counseling, pshyciatry, and substance abuse treatment services. [address] [place], [place] [address], [phone]. To be seen, go in person to their offices Monday, Wednesday, or Friday 8am-3pm during their same day access hours. They also have walk in crisis services 8am-12am 7 days a week. [email or web address]   6. [facility] [phone]      http://APA.org/depression-guideline"> https://[email or web address]"> http://point-[email or web address]-[email or web address]/"> http://point-of-care.el[email or web address]">  Managing Depression, Adult Depression is a mental health condition that affects your thoughts, feelings, and actions. Being diagnosed with depression can bring you relief if you did not know why you have felt or behaved a certain way. It  could also leave you feeling overwhelmed with uncertainty about your future. Preparing yourself tomanage your symptoms can help you feel more positive about your future. How to manage lifestyle changes Managing stress  Stress is your body's reaction to life changes and events, both good and bad. Stress can add to your feelings of depression. Learning to manage your stresscan help lessen your feelings of depression. Try some of the following approaches to reducing your stress (stress reduction techniques): Listen to music that you enjoy and that inspires you. Try using a meditation app or take a meditation class. Develop a practice that helps you connect with your spiritual self. Walk in nature, pray, or go to a place of worship. Do some deep breathing. To do this, inhale slowly through your nose. Pause at the top of your inhale for a few seconds and then exhale slowly, letting your muscles relax. Practice yoga to help relax and work your muscles. Choose a stress reduction technique that suits your lifestyle and personality. These techniques take time and practice to develop. Set aside 5-15 minutes a day to do them. Therapists can offer training in these techniques. Other things you can do to manage stress include: Keeping a stress diary. Knowing your limits and saying no when you think something is too much. Paying attention to how you react to certain situations. You may not be able to control everything, but you can change your reaction. Adding humor to your life by watching funny films or TV shows. Making time for activities that you enjoy and that relax you.  Medicines Medicines, such as antidepressants, are often a part of treatment for depression. Talk with your pharmacist or health care provider about all the medicines, supplements, and herbal products that you take, their possible side effects, and what medicines and other products are safe to take together. Make sure to report any side  effects you may have to your health care provider. Relationships Your health care provider may suggest family therapy, couples therapy, orindividual  therapy as part of your treatment. How to recognize changes Everyone responds differently to treatment for depression. As you recover from depression, you may start to: Have more interest in doing activities. Feel less hopeless. Have more energy. Overeat less often, or have a better appetite. Have better mental focus. It is important to recognize if your depression is not getting better or is getting worse. The symptoms you had in the beginning may return, such as: Tiredness (fatigue) or low energy. Eating too much or too little. Sleeping too much or too little. Feeling restless, agitated, or hopeless. Trouble focusing or making decisions. Unexplained physical complaints. Feeling irritable, angry, or aggressive. If you or your family members notice these symptoms coming back, let yourhealth care provider know right away. Follow these instructions at home: Activity  Try to get some form of exercise each day, such as walking, biking, swimming, or lifting weights. Practice stress reduction techniques. Engage your mind by taking a class or doing some volunteer work.  Lifestyle Get the right amount and quality of sleep. Cut down on using caffeine, tobacco, alcohol, and other potentially harmful substances. Eat a healthy diet that includes plenty of vegetables, fruits, whole grains, low-fat dairy products, and lean protein. Do not eat a lot of foods that are high in solid fats, added sugars, or salt (sodium). General instructions Take over-the-counter and prescription medicines only as told by your health care provider. Keep all follow-up visits as told by your health care provider. This is important. Where to find support Talking to others  Friends and family members can be sources of support and guidance. Talk to trusted friends or family  members about your condition. Explain your symptoms to them, and let them know that you are working with a health care provider to treat your depression. Tell friends and family members how they also can behelpful. Finances Find appropriate mental health providers that fit with your financial situation. Talk with your health care provider about options to get reduced prices on your medicines. Where to find more information You can find support in your area from: Anxiety and Depression Association of America (ADAA): www.adaa.org Mental Health America: www.mentalhealthamerica.net [employer] on Mental Illness: www.nami.org Contact a health care provider if: You stop taking your antidepressant medicines, and you have any of these symptoms: Nausea. Headache. Light-headedness. Chills and body aches. Not being able to sleep (insomnia). You or your friends and family think your depression is getting worse. Get help right away if: You have thoughts of hurting yourself or others. If you ever feel like you may hurt yourself or others, or have thoughts about taking your own life, get help right away. Go to your nearest emergency department or: Call your local emergency services (911 in the [place] Call a suicide crisis helpline, such as the National Suicide Prevention Lifeline at 2090099075. This is open 24 hours a day in the [place] Text the Crisis Text Line at 312-601-1950 (in the [place].). Summary If you are diagnosed with depression, preparing yourself to manage your symptoms is a good way to feel positive about your future. Work with your health care provider on a management plan that includes stress reduction techniques, medicines (if applicable), therapy, and healthy lifestyle habits. Keep talking with your health care provider about how your treatment is working. If you have thoughts about taking your own life, call a suicide crisis helpline or text a crisis text line. This information is not  intended to replace advice given to you by your  health care provider. Make sure you discuss any questions you have with your healthcare provider. Document Revised: 09/07/2019 Document Reviewed: 09/07/2019 Elsevier Patient Education  2022 [employer].      Institute of Medicine Recommended Dietary Allowances for Calcium and Vitamin D  Age (yr) Calcium Recommended Dietary Allowance (mg/day) Vitamin D Recommended Dietary Allowance (international units/day)  9-18 1,300 600  19-50 1,000 600  51-70 1,200 600  71 and older 1,200 800  Data from Institute of Medicine. Dietary reference intakes: calcium, vitamin D. [place], [place]: [employer]; 2011.   Exercising to Stay Healthy To become healthy and stay healthy, it is recommended that you do moderate-intensity and vigorous-intensity exercise. You can tell that you are exercising at a moderate intensity if your heart starts beating faster and you start breathing faster but can still hold a conversation. You can tell that you are exercising at a vigorous intensity if you are breathing much harder andfaster and cannot hold a conversation while exercising. Exercising regularly is important. It has many health benefits, such as: Improving overall fitness, flexibility, and endurance. Increasing bone density. Helping with weight control. Decreasing body fat. Increasing muscle strength. Reducing stress and tension. Improving overall health. How often should I exercise? Choose an activity that you enjoy, and set realistic goals. Your health careprovider can help you make an activity plan that works for you. Exercise regularly as told by your health care provider. This may include: Doing strength training two times a week, such as: Lifting weights. Using resistance bands. Push-ups. Sit-ups. Yoga. Doing a certain intensity of exercise for a given amount of time. Choose from these options: A total of 150 minutes of moderate-intensity  exercise every week. A total of 75 minutes of vigorous-intensity exercise every week. A mix of moderate-intensity and vigorous-intensity exercise every week. Children, pregnant women, people who have not exercised regularly, people who are overweight, and older adults may need to talk with a health care provider about what activities are safe to do. If you have a medical condition, be sureto talk with your health care provider before you start a new exercise program. What are some exercise ideas? Moderate-intensity exercise ideas include: Walking 1 mile (1.6 km) in about 15 minutes. Biking. Hiking. Golfing. Dancing. Water aerobics. Vigorous-intensity exercise ideas include: Walking 4.5 miles (7.2 km) or more in about 1 hour. Jogging or running 5 miles (8 km) in about 1 hour. Biking 10 miles (16.1 km) or more in about 1 hour. Lap swimming. Roller-skating or in-line skating. Cross-country skiing. Vigorous competitive sports, such as football, basketball, and soccer. Jumping rope. Aerobic dancing. What are some everyday activities that can help me to get exercise? Yard work, such as: Child psychotherapist. Raking and bagging leaves. Washing your car. Pushing a stroller. Shoveling snow. Gardening. Washing windows or floors. How can I be more active in my day-to-day activities? Use stairs instead of an elevator. Take a walk during your lunch break. If you drive, park your car farther away from your work or school. If you take public transportation, get off one stop early and walk the rest of the way. Stand up or walk around during all of your indoor phone calls. Get up, stretch, and walk around every 30 minutes throughout the day. Enjoy exercise with a friend. Support to continue exercising will help you keep a regular routine of activity. What guidelines can I follow while exercising? Before you start a new exercise program, talk with your health care provider. Do not  exercise so  much that you hurt yourself, feel dizzy, or get very short of breath. Wear comfortable clothes and wear shoes with good support. Drink plenty of water while you exercise to prevent dehydration or heat stroke. Work out until your breathing and your heartbeat get faster. Where to find more information U.S. Department of Health and Human Services: ThisPath.fiwww.hhs.gov Centers for Disease Control and Prevention (CDC): FootballExhibition.com.brwww.cdc.gov Summary Exercising regularly is important. It will improve your overall fitness, flexibility, and endurance. Regular exercise also will improve your overall health. It can help you control your weight, reduce stress, and improve your bone density. Do not exercise so much that you hurt yourself, feel dizzy, or get very short of breath. Before you start a new exercise program, talk with your health care provider. This information is not intended to replace advice given to you by your health care provider. Make sure you discuss any questions you have with your healthcare provider. Document Revised: 10/12/2020 Document Reviewed: 10/12/2020 Elsevier Patient Education  2022 Elsevier Inc. Budget-Friendly Healthy Eating There are many ways to save money at the grocery store and continue to eat healthy. You can be successful if you: Plan meals according to your budget. Make a grocery list and only purchase food according to your grocery list. Prepare food yourself at home. What are tips for following this plan? Reading food labels Compare food labels between brand name foods and the store brand. Often the nutritional value is the same, but the store brand is lower cost. Look for products that do not have added sugar, fat, or salt (sodium). These often cost the same but are healthier for you. Products may be labeled as: Sugar-free. Nonfat. Low-fat. Sodium-free. Low-sodium. Look for lean ground beef labeled as at least 92% lean and 8% fat. Shopping  Buy only the items on your  grocery list and go only to the areas of the store that have the items on your list. Use coupons only for foods and brands you normally buy. Avoid buying items you wouldn't normally buy simply because they are on sale. Check online and in newspapers for weekly deals. Buy healthy items from the bulk bins when available, such as herbs, spices, flour, pasta, nuts, and dried fruit. Buy fruits and vegetables that are in season. Prices are usually lower on in-season produce. Look at the unit price on the price tag. Use it to compare different brands and sizes to find out which item is the best deal. Choose healthy items that are often low-cost, such as carrots, potatoes, apples, bananas, and oranges. Dried or canned beans are a low-cost protein source. Buy in bulk and freeze extra food. Items you can buy in bulk include meats, fish, poultry, frozen fruits, and frozen vegetables. Avoid buying "ready-to-eat" foods, such as pre-cut fruits and vegetables and pre-made salads. If possible, shop around to discover where you can find the best prices. Consider other retailers such as dollar stores, larger AMR Corporationwholesale stores, local fruit and vegetable stands, and farmers markets. Do not shop when you are hungry. If you shop while hungry, it may be hard to stick to your list and budget. Resist impulse buying. Use your grocery list as your official plan for the week. Buy a variety of vegetables and fruits by purchasing fresh, frozen, and canned items. Look at the top and bottom shelves for deals. Foods at eye level (eye level of an adult or child) are usually more expensive. Be efficient with your time when shopping. The more time  you spend at the store, the more money you are likely to spend. To save money when choosing more expensive foods like meats and dairy: Choose cheaper cuts of meat, such as bone-in chicken thighs and drumsticks instead of skinless and boneless chicken. When you are ready to prepare the chicken,  you can remove the skin yourself to make it healthier. Choose lean meats like chicken or [place] instead of beef. Choose canned seafood, such as tuna, salmon, or sardines. Buy eggs as a low-cost source of protein. Buy dried beans and peas, such as lentils, split peas, or kidney beans instead of meats. Dried beans and peas are a good alternative source of protein. Buy the larger tubs of yogurt instead of individual-sized containers. Choose water instead of sodas and other sweetened beverages. Avoid buying chips, cookies, and other "junk food." These items are usually expensive and not healthy.  Cooking Make extra food and freeze the extras in meal-sized containers or in individual portions for fast meals and snacks. Pre-cook on days when you have extra time to prepare meals in advance. You can keep these meals in the fridge or freezer and reheat for a quick meal. When you come home from the grocery store, wash, peel, and cut fruits and vegetables so they are ready to use and eat. This will help reduce food waste. Meal planning Do not eat out or get fast food. Prepare food at home. Make a grocery list and make sure to bring it with you to the store. If you have a smart phone, you could use your phone to create your shopping list. Plan meals and snacks according to a grocery list and budget you create. Use leftovers in your meal plan for the week. Look for recipes where you can cook once and make enough food for two meals. Prepare budget-friendly types of meals like stews, casseroles, and stir-fry dishes. Try some meatless meals or try "no cook" meals like salads. Make sure that half your plate is filled with fruits or vegetables. Choose from fresh, frozen, or canned fruits and vegetables. If eating canned, remember to rinse them before eating. This will remove any excess salt added for packaging. Summary Eating healthy on a budget is possible if you plan your meals according to your budget,  purchase according to your budget and grocery list, and prepare food yourself. Tips for buying more food on a limited budget include buying generic brands, using coupons only for foods you normally buy, and buying healthy items from the bulk bins when available. Tips for buying cheaper food to replace expensive food include choosing cheaper, lean cuts of meat, and buying dried beans and peas. This information is not intended to replace advice given to you by your health care provider. Make sure you discuss any questions you have with your healthcare provider. Document Revised: 08/09/2020 Document Reviewed: 08/09/2020 Elsevier Patient Education  2022 Elsevier Inc. Bone Health Bones protect organs, store calcium, anchor muscles, and support the whole body. Keeping your bones strong is important, especially as you get older. Youcan take actions to help keep your bones strong and healthy. Why is keeping my bones healthy important?  Keeping your bones healthy is important because your body constantly replaces bone cells. Cells get old, and new cells take their place. As we age, we lose bone cells because the body may not be able to make enough new cells to replace the old cells. The amount of bone cells and bone tissue you have is referred toas  bone mass. The higher your bone mass, the stronger your bones. The aging process leads to an overall loss of bone mass in the body, which can increase the likelihood of: Joint pain and stiffness. Broken bones. A condition in which the bones become weak and brittle (osteoporosis). A large decline in bone mass occurs in older adults. In women, it occurs aboutthe time of menopause. What actions can I take to keep my bones healthy? Good health habits are important for maintaining healthy bones. This includes eating nutritious foods and exercising regularly. To have healthy bones, you need to get enough of the right minerals and vitamins. Most nutrition experts  recommend getting these nutrients from the foods that you eat. In some cases, taking supplements may also be recommended. Doing certain types of exercise isalso important for bone health. What are the nutritional recommendations for healthy bones?  Eating a well-balanced diet with plenty of calcium and vitamin D will help to protect your bones. Nutritional recommendations vary from person to person. Ask your health care provider what is healthy for you. Here are some generalguidelines. Get enough calcium Calcium is the most important (essential) mineral for bone health. Most people can get enough calcium from their diet, but supplements may be recommended for people who are at risk for osteoporosis. Good sources of calcium include: Dairy products, such as low-fat or nonfat milk, cheese, and yogurt. Dark green leafy vegetables, such as bok choy and broccoli. Calcium-fortified foods, such as orange juice, cereal, bread, soy beverages, and tofu products. Nuts, such as almonds. Follow these recommended amounts for daily calcium intake: Children, age 36-3: 700 mg. Children, age 27-8: 1,000 mg. Children, age 54-13: 1,300 mg. Teens, age 52-18: 1,300 mg. Adults, age 76-50: 1,000 mg. Adults, age 72-70: Men: 1,000 mg. Women: 1,200 mg. Adults, age 65 or older: 1,200 mg. Pregnant and breastfeeding females: Teens: 1,300 mg. Adults: 1,000 mg. Get enough vitamin D Vitamin D is the most essential vitamin for bone health. It helps the body absorb calcium. Sunlight stimulates the skin to make vitamin D, so be sure to get enough sunlight. If you live in a cold climate or you do not get outside often, your health care provider may recommend that you take vitamin D supplements. Good sources of vitamin D in your diet include: Egg yolks. Saltwater fish. Milk and cereal fortified with vitamin D. Follow these recommended amounts for daily vitamin D intake: Children and teens, age 36-18: 600 international  units. Adults, age 94 or younger: 400-800 international units. Adults, age 275 or older: 800-1,000 international units. Get other important nutrients Other nutrients that are important for bone health include: Phosphorus. This mineral is found in meat, poultry, dairy foods, nuts, and legumes. The recommended daily intake for adult men and adult women is 700 mg. Magnesium. This mineral is found in seeds, nuts, dark green vegetables, and legumes. The recommended daily intake for adult men is 400-420 mg. For adult women, it is 310-320 mg. Vitamin K. This vitamin is found in green leafy vegetables. The recommended daily intake is 120 mg for adult men and 90 mg for adult women. What type of physical activity is best for building and maintaining healthybones? Weight-bearing and strength-building activities are important for building and maintaining healthy bones. Weight-bearing activities cause muscles and bones to work against gravity. Strength-building activities increase the strength of the muscles that support bones. Weight-bearing and muscle-building activities include: Walking and hiking. Jogging and running. Dancing. Gym exercises. Lifting weights. Tennis and racquetball. Climbing  stairs. Aerobics. Adults should get at least 30 minutes of moderate physical activity on most days. Children should get at least 60 minutes of moderate physical activity onmost days. Ask your health care provider what type of exercise is best for you. How can I find out if my bone mass is low? Bone mass can be measured with an X-ray test called a bone mineral density (BMD) test. This test is recommended for all women who are age 70 or older. It may also be recommended for: Men who are age 64 or older. People who are at risk for osteoporosis because of: Having bones that break easily. Having a long-term disease that weakens bones, such as kidney disease or rheumatoid arthritis. Having menopause earlier than  normal. Taking medicine that weakens bones, such as steroids, thyroid hormones, or hormone treatment for breast cancer or prostate cancer. Smoking. Drinking three or more alcoholic drinks a day. If you find that you have a low bone mass, you may be able to preventosteoporosis or further bone loss by changing your diet and lifestyle. Where can I find more information? For more information, check out the following websites: National Osteoporosis Foundation: https://carlson-fletcher.info/ [employer]: www.bones.http://www.myers.net/ International Osteoporosis Foundation: Investment banker, operational.iofbonehealth.org Summary The aging process leads to an overall loss of bone mass in the body, which can increase the likelihood of broken bones and osteoporosis. Eating a well-balanced diet with plenty of calcium and vitamin D will help to protect your bones. Weight-bearing and strength-building activities are also important for building and maintaining strong bones. Bone mass can be measured with an X-ray test called a bone mineral density (BMD) test. This information is not intended to replace advice given to you by your health care provider. Make sure you discuss any questions you have with your healthcare provider. Document Revised: 11/23/2017 Document Reviewed: 11/23/2017 Elsevier Patient Education  2022 [employer].

## 2021-05-02 NOTE — Progress Notes (Signed)
g   Gynecology Annual Exam  PCP: Pcp, No  Chief Complaint:  Chief Complaint  Patient presents with   Gynecologic Exam    History of Present Illness: Patient is a 30 y.o. [id number] presents for annual exam.   She reports that she has several concerns.  She has not been sleeping very well and feels like she is struggling mentally.  Every decision she makes that she feels like it is about to dictate if she is going to end up and have an RN help.  She has been under a lot of stress since she was separated for her children about a year and a half ago.  She reports that she started living in the same home where they had been together again in October and it has been a difficult adjustment.  She reports that she is having feelings of self-harm and feelings that the world may be better off without her.  She reports that she does not have intentions to commit suicide nor means to however she is in engaging in some self-harm.  She reports that she has a cut on her left leg which she continues to pick at and has not allowed to heal properly.  This leg wound has been infected in the past and she is taking it on antibiotics on and off for it.  She reports that she feels like she needs antibiotics for her leg again. She reports her leg issues has been going on for 2 months.  She reports that she was seeing [facility] as an outpatient for help with her mental issues.  Unfortunately she began having difficulty going there.  She reports that whenever she was going there she was having hallucinations. She would see everyone have the same face, and objects come to life. She felt like she was about to be locked away.  She feels like she would do worse seeking inpatient therapy although she agrees she probably needs it.  She is struggled for a long time with drug usage.  She reports that she is currently occasionally smoking cocaine.  She feels that when she uses cocaine it helps her significantly as she has a calming feeling.   Ideally she would like to stop all drug use however she has not yet been able to.  She reports she no longer uses them methamphetamines or heroin which has been improvement.  She is additionally having difficulty with irregular menstrual bleeding, heavy menstrual bleeding, and painful menstrual bleeding.  She reports that her menstrual cycle is irregular.  She passes large clots the size of her palm.  She has sensations of gushing and flooding.  She saturates a tampon and pad more frequently than every hour.  She has accidents where she bleeds through her clothing and pain which limits her activities.  LMP: Patient's last menstrual period was 04/16/2021. Average Interval: irregular Duration of flow: 4 days Heavy Menses: yes Dysmenorrhea: yes  The patient reports her exercise generally consists of none .  The patient reports current symptoms of depression.   PHQ-9: 26 GAD-7: 21   Review of Systems: ROS  Past Medical History:  Past Medical History:  Diagnosis Date   Anemia    Cervicalgia    History of chickenpox    Hives    Mental disorder     Past Surgical History:  Past Surgical History:  Procedure Laterality Date   CESAREAN SECTION  2013   CESAREAN SECTION  2015   CESAREAN SECTION N/A 11/01/2019  Procedure: CESAREAN SECTION;  Surgeon: Natale Milch, MD;  Location: [facility] ORS;  Service: Obstetrics;  Laterality: N/A;   INCISION AND DRAINAGE OF WOUND Right 06/25/2020   Procedure: IRRIGATION AND DEBRIDEMENT WOUND-Right Leg;  Surgeon: Signa Kell, MD;  Location: [facility] ORS;  Service: Orthopedics;  Laterality: Right;    Gynecologic History:  Patient's last menstrual period was 04/16/2021. Menarche: 9  History of fibroids, polyps, or ovarian cysts? : no  History of PCOS? "Possibly" Hstory of Endometriosis? no History of abnormal pap smears? yes Have you had any sexually transmitted infections in the past? yes  She reports HPV vaccination in the past.   Last Pap:  Results were: 2020 NIL  She identifies as a female. She is sexually active with men.   She has dyspareunia. She denies postcoital bleeding.  She currently uses Depo-Provera injections for contraception.    Obstetric History: [id number]  Family History:  Family History  Problem Relation Age of Onset   Alcohol abuse Mother    GER disease Mother    Depression Mother    Alcohol abuse Father    Emphysema Father    Diabetes Maternal Grandmother    Heart disease Maternal Grandmother    Hyperlipidemia Maternal Grandmother    Hypertension Maternal Grandmother    Kidney disease Maternal Grandmother    Lung cancer Maternal Grandmother     Social History:  Social History   Socioeconomic History   Marital status: Single    Spouse name: Not on file   Number of children: Not on file   Years of education: Not on file   Highest education level: Not on file  Occupational History   Not on file  Tobacco Use   Smoking status: Every Day    Packs/day: 0.50    Years: 11.00    Pack years: 5.50    Types: Cigarettes    Start date: 09/17/2004   Smokeless tobacco: Never  Vaping Use   Vaping Use: Never used  Substance and Sexual Activity   Alcohol use: No    Alcohol/week: 0.0 standard drinks   Drug use: Yes    Types: Cocaine   Sexual activity: Yes    Partners: Male  Other Topics Concern   Not on file  Social History Narrative   Not on file   Social Determinants of Health   Financial Resource Strain: Not on file  Food Insecurity: Not on file  Transportation Needs: Not on file  Physical Activity: Not on file  Stress: Not on file  Social Connections: Not on file  Intimate Partner Violence: Not At Risk   Fear of Current or Ex-Partner: No   Emotionally Abused: No   Physically Abused: No   Sexually Abused: No    Allergies:  No Known Allergies  Medications: Prior to Admission medications   Medication Sig Start Date End Date Taking? Authorizing Provider  amoxicillin-clavulanate  (AUGMENTIN) 875-125 MG tablet Take 1 tablet by mouth every 12 (twelve) hours. 02/01/21   Dahlia Byes A, NP  ibuprofen (ADVIL) 200 MG tablet Take 200 mg by mouth every 6 (six) hours as needed. Pt reports taking as needed Patient not taking: Reported on 05/02/2021    [provider]  permethrin (ELIMITE) 5 % cream Apply from neck down. Leave on for 12 hours and then wash off. Repeat in 2 weeks if needed. 02/01/21   Janace Aris, NP    Physical Exam Vitals: Blood pressure 96/68, height 4\' 11"  (1.499 m), weight 87 lb 6.4 oz (  39.6 kg), last menstrual period 04/16/2021, not currently breastfeeding.  OBGyn Exam   Female chaperone present for pelvic and breast  portions of the physical exam  Assessment: 30 y.o. [id number] routine annual exam  Plan: Problem List Items Addressed This Visit   None   1) STI screening was offered and accepted. Declines a blood draw, vaginal swab collected.  2) ASCCP guidelines and rational discussed.  Pap smear up to date.  3) Contraception - pregnancy test negative today. Will start Depo Provera, rx sent, return for injection.   4) Routine healthcare maintenance including cholesterol, diabetes screening discussed Declines  5) Leg infection ans self harm injury: refused exam, recommended twice a day cleaning and using hibiclens, will send bactrim rx. Provided her with bandages and cleaning supplies for the wound.  6) Yeast vaginitis- will treat with diflucan  7)Mental health crisis and history of drug abuse : I discussed with Rose who is a Child psychotherapist. Rose provided a list of resources and I gave this to the patient. I recommended phone visits which the patient feels like she would be more comfortable risks. Encouraged her to consider inpatient care for suicidal ideation. She denies intentions to kill herself.  She declines referral to psychiatry today.   Weill plan follo wup with the patient  Adelene Idler MD, [facility], [facility] 05/02/2021 2:59 PM

## 2021-05-06 LAB — NUSWAB VAGINITIS PLUS (VG+)
Candida albicans, NAA: NEGATIVE
Candida glabrata, NAA: POSITIVE — AB
Chlamydia trachomatis, NAA: NEGATIVE
Neisseria gonorrhoeae, NAA: NEGATIVE
Trich vag by NAA: NEGATIVE

## 2021-05-08 ENCOUNTER — Other Ambulatory Visit: Payer: Self-pay | Admitting: Obstetrics and Gynecology

## 2021-05-09 ENCOUNTER — Telehealth: Payer: Self-pay

## 2021-05-09 NOTE — Telephone Encounter (Signed)
Patient is returning missed call please advise? 

## 2021-05-10 ENCOUNTER — Other Ambulatory Visit: Payer: Self-pay | Admitting: Obstetrics and Gynecology

## 2021-05-10 NOTE — Progress Notes (Signed)
Prescription for Candida glabrata called into [facility].

## 2021-05-21 ENCOUNTER — Telehealth: Payer: Self-pay

## 2021-05-21 NOTE — Telephone Encounter (Signed)
Pt calling; wants a call back.  [phone]  Pt states the medication for the yeast inf is too expensive at $25; can something cheaper be called in to [facility]?

## 2021-05-21 NOTE — Telephone Encounter (Signed)
There is not a cheaper alternative

## 2021-05-22 NOTE — Telephone Encounter (Signed)
Left detailed msg 'there is not a cheaper alternative; hope you can get your medicine soon.'

## 2021-07-26 ENCOUNTER — Encounter: Payer: Medicaid Other | Admitting: Obstetrics and Gynecology

## 2021-08-13 ENCOUNTER — Telehealth: Payer: Self-pay

## 2021-08-13 NOTE — Telephone Encounter (Signed)
TC from Frederica Kuster from [facility], to schedule new OB appointment for this patient. Per Darl Pikes, patient LMP 07/01/2021, patient scheduled for 09/09/2021 at about 10 weeks pregnancy (no Nov. Schedule at this time). Darl Pikes to fax positive PT to maternity.Burt Knack, RN

## 2021-09-09 ENCOUNTER — Encounter: Payer: Self-pay | Admitting: Advanced Practice Midwife

## 2021-09-09 ENCOUNTER — Other Ambulatory Visit: Payer: Self-pay

## 2021-09-09 ENCOUNTER — Ambulatory Visit: Payer: Medicaid Other | Admitting: Advanced Practice Midwife

## 2021-09-09 VITALS — BP 93/83 | HR 103 | Temp 97.7°F | Wt 124.2 lb

## 2021-09-09 DIAGNOSIS — O0991 Supervision of high risk pregnancy, unspecified, first trimester: Secondary | ICD-10-CM | POA: Diagnosis not present

## 2021-09-09 DIAGNOSIS — T1491XA Suicide attempt, initial encounter: Secondary | ICD-10-CM | POA: Insufficient documentation

## 2021-09-09 DIAGNOSIS — F419 Anxiety disorder, unspecified: Secondary | ICD-10-CM | POA: Insufficient documentation

## 2021-09-09 DIAGNOSIS — X838XXA Intentional self-harm by other specified means, initial encounter: Secondary | ICD-10-CM | POA: Insufficient documentation

## 2021-09-09 DIAGNOSIS — O99011 Anemia complicating pregnancy, first trimester: Secondary | ICD-10-CM

## 2021-09-09 DIAGNOSIS — F1120 Opioid dependence, uncomplicated: Secondary | ICD-10-CM

## 2021-09-09 DIAGNOSIS — F1290 Cannabis use, unspecified, uncomplicated: Secondary | ICD-10-CM | POA: Insufficient documentation

## 2021-09-09 DIAGNOSIS — Z23 Encounter for immunization: Secondary | ICD-10-CM

## 2021-09-09 DIAGNOSIS — Z7289 Other problems related to lifestyle: Secondary | ICD-10-CM | POA: Insufficient documentation

## 2021-09-09 DIAGNOSIS — O99019 Anemia complicating pregnancy, unspecified trimester: Secondary | ICD-10-CM | POA: Insufficient documentation

## 2021-09-09 DIAGNOSIS — K089 Disorder of teeth and supporting structures, unspecified: Secondary | ICD-10-CM | POA: Insufficient documentation

## 2021-09-09 DIAGNOSIS — X838XXS Intentional self-harm by other specified means, sequela: Secondary | ICD-10-CM

## 2021-09-09 DIAGNOSIS — Z62810 Personal history of physical and sexual abuse in childhood: Secondary | ICD-10-CM

## 2021-09-09 DIAGNOSIS — O99012 Anemia complicating pregnancy, second trimester: Secondary | ICD-10-CM

## 2021-09-09 DIAGNOSIS — Z8751 Personal history of pre-term labor: Secondary | ICD-10-CM

## 2021-09-09 DIAGNOSIS — Z3490 Encounter for supervision of normal pregnancy, unspecified, unspecified trimester: Secondary | ICD-10-CM | POA: Insufficient documentation

## 2021-09-09 DIAGNOSIS — Z98891 History of uterine scar from previous surgery: Secondary | ICD-10-CM

## 2021-09-09 DIAGNOSIS — Z653 Problems related to other legal circumstances: Secondary | ICD-10-CM | POA: Insufficient documentation

## 2021-09-09 LAB — URINALYSIS
Bilirubin, UA: NEGATIVE
Glucose, UA: NEGATIVE
Ketones, UA: NEGATIVE
Leukocytes,UA: NEGATIVE
Nitrite, UA: NEGATIVE
Protein,UA: NEGATIVE
RBC, UA: NEGATIVE
Specific Gravity, UA: 1.02 (ref 1.005–1.030)
Urobilinogen, Ur: 0.2 mg/dL (ref 0.2–1.0)
pH, UA: 7 (ref 5.0–7.5)

## 2021-09-09 LAB — WET PREP FOR TRICH, YEAST, CLUE: Trichomonas Exam: NEGATIVE

## 2021-09-09 LAB — HEMOGLOBIN, FINGERSTICK: Hemoglobin: 9.9 g/dL — ABNORMAL LOW (ref 11.1–15.9)

## 2021-09-09 NOTE — Progress Notes (Addendum)
Patient here for new OB visit, currently incarcerated. LMP unsure, patient states she was using Depo and had no period between stopping Depo and becoming pregnant. PT and drug screen in out guide. Desires flu vaccine today. Last Pap, in Epic, 07/14/2019, NIL.Marland KitchenBurt Knack, RN

## 2021-09-09 NOTE — Progress Notes (Signed)
Pharmacy verified, not currently using suboxone. Flu vaccine given, tolerated well, VIS and NCIR in accordian folder.Burt Knack, RN

## 2021-09-09 NOTE — Progress Notes (Signed)
[facility] HE[facility] DEPT [facility] [address] [place] [address] [place] [address] [phone]  INITIAL PRENATAL VISIT NOTE  Subjective:  Lori Pollard is a 30 y.o. SWF smoker [id number] (9, 7, 2) at [redacted]w[redacted]d being seen today to start prenatal care at the [facility]. She feels "unexpected but happy" about unplanned pregnancy where she missed her DMPA. 30 yo unemployed FOB feels "excited" about pregnancy; he is the father of all of her children; in supportive 15 year relationship. She has been incarcerated since 07/24/21 for larceny with a court date of 10/15/21. She was living with fiance and his mom before she was arrested. She is not working. Her 3 kids live with their p. Aunt who has "kinship custody" of them.  LMP unknown because she was on DMPA.  Last pap 07/14/2019 neg. Last dental exam 2 years ago. Was on Suboxone x 7 years with rx and then was getting it off the street and  using IV with last use 07/22/21.  She is currently monitored for the following issues for this high-risk pregnancy and has Bipolar disorder (HCC); PTSD (post-traumatic stress disorder); History of narcotic addiction (HCC); History of cesarean delivery x3; Suboxone maintenance treatment complicating pregnancy, antepartum (HCC); Polysubstance abuse (HCC); Malnutrition of moderate degree; Smoker 1 ppd; Pica ice daily; Heroin abuse (HCC); Methamphetamine abuse (HCC); Cocaine abuse (HCC); Supervision of high risk pregnancy in first trimester; Low birth weight infant at term 05/26/14 5#0 at 40 wks; and H/O premature delivery at 31 wks 3#6 11/01/19 on their problem list.  Patient reports no complaints.   .  .   . Denies leaking of fluid.   Indications for ASA therapy (per uptodate) One of the following: Previous pregnancy with preeclampsia, especially early onset and with an adverse outcome No Multifetal gestation No Chronic hypertension No Type 1 or 2 diabetes  mellitus No Chronic kidney disease No Autoimmune disease (antiphospholipid syndrome, systemic lupus erythematosus) No  Two or more of the following: Nulliparity No Obesity (body mass index >30 kg/m2) No Family history of preeclampsia in mother or sister No Age =35 years No Sociodemographic characteristics (African American race, low socioeconomic level) No Personal risk factors (eg, previous pregnancy with low birth weight or small for gestational age infant, previous adverse pregnancy outcome [eg, stillbirth], interval >10 years between pregnancies) Yes   The following portions of the patient's history were reviewed and updated as appropriate: allergies, current medications, past family history, past medical history, past social history, past surgical history and problem list. Problem list updated.  Objective:   Vitals:   09/09/21 0822  BP: 93/83  Pulse: (!) 103  Temp: 97.7 F (36.5 C)  Weight: 124 lb 3.2 oz (56.3 kg)    Fetal Status:            Physical Exam Vitals and nursing note reviewed.  Constitutional:      General: She is not in acute distress.    Appearance: Normal appearance. She is well-developed and normal weight.  HENT:     Head: Normocephalic and atraumatic.     Right Ear: External ear normal.     Left Ear: External ear normal.     Nose: Nose normal. No congestion or rhinorrhea.     Mouth/Throat:     Lips: Pink.     Mouth: Mucous membranes are moist.     Dentition: Normal dentition. No dental caries.     Pharynx: Oropharynx is clear. Uvula midline.  Comments: Dentition: poor dentition with missing teeth, severe decay, pain Last dental exam 2020 Eyes:     General: No scleral icterus.    Conjunctiva/sclera: Conjunctivae normal.  Neck:     Thyroid: No thyroid mass, thyromegaly or thyroid tenderness.  Cardiovascular:     Rate and Rhythm: Normal rate.     Pulses: Normal pulses.     Comments: Extremities are warm and well perfused Pulmonary:      Effort: Pulmonary effort is normal.     Breath sounds: Normal breath sounds.  Chest:     Chest wall: No mass.  Breasts:    Tanner Score is 5.     Breasts are symmetrical.     Right: Normal. No mass, nipple discharge or skin change.     Left: Normal. No mass, nipple discharge or skin change.  Abdominal:     Palpations: Abdomen is soft.     Tenderness: There is no abdominal tenderness.     Comments: Gravid, soft without tenderness, FHR=160, FH 1/2 S&U  Genitourinary:    General: Normal vulva.     Exam position: Lithotomy position.     Pubic Area: No rash.      Labia:        Right: No rash.        Left: No rash.      Vagina: Vaginal discharge (large amt thick white leukorrhea, ph< 4.5) present.     Cervix: Normal.     Uterus: Normal. Enlarged (Gravid 14-16 wks size). Not tender.      Rectum: Normal. No external hemorrhoid.  Musculoskeletal:     Right lower leg: No edema.     Left lower leg: No edema.  Lymphadenopathy:     Cervical: No cervical adenopathy.     Upper Body:     Right upper body: No axillary adenopathy.     Left upper body: No axillary adenopathy.  Skin:    General: Skin is warm.     Capillary Refill: Capillary refill takes less than 2 seconds.  Neurological:     Mental Status: She is alert.    Assessment and Plan:  Pregnancy: [id number] at [redacted]w[redacted]d  1. History of cesarean delivery x3 Needs delivery plans apt for repeat c/s  2. Supervision of high risk pregnancy in first trimester MFM u/s ordered for dating, anatomy Counseled on weight gain of 25-35 lbs this pregnancy Pt desires apt with Kathreen Cosier, LCSW Needs dental list for dental exam asap - Chlamydia/GC NAA, Confirmation - HCV Ab w Reflex to Quant PCR - HIV-1/HIV-2 Qualitative RNA - Hgb Fractionation Cascade - Lead, blood (adult age 77 yrs or greater) - CBC/D/Plt+RPR+Rh+ABO+Rub Ab... - QuantiFERON-TB Gold Plus - Urine Culture - 196222 Drug Screen - WET PREP FOR TRICH, YEAST, CLUE - Urinalysis  (Urine Dip) - Hemoglobin, venipuncture - [place] MFM OB COMP + 14 WK; Future  3. Low birth weight infant at term 05/26/14 5#0 at 40 wks monitor  4. H/O premature delivery at 28 wks 3#6 11/01/19 Wants 17-P despite counseling re most recent FDA statement    Discussed overview of care and coordination with inpatient delivery practices including [facility], [facility], [facility] and [facility].   Reviewed Centering pregnancy as standard of care at [facility]   Preterm labor symptoms and general obstetric precautions including but not limited to vaginal bleeding, contractions, leaking of fluid and fetal movement were reviewed in detail with the patient.  Please refer to After Visit Summary for other counseling recommendations.  No follow-ups on file.  No future appointments.  Herbie Saxon, CNM

## 2021-09-10 ENCOUNTER — Telehealth: Payer: Self-pay

## 2021-09-10 LAB — 789231 7+OXYCODONE-BUND
Amphetamines, Urine: NEGATIVE ng/mL
BENZODIAZ UR QL: NEGATIVE ng/mL
Barbiturate screen, urine: NEGATIVE ng/mL
Cannabinoid Quant, Ur: NEGATIVE ng/mL
Cocaine (Metab.): NEGATIVE ng/mL
OPIATE SCREEN URINE: NEGATIVE ng/mL
Oxycodone/Oxymorphone, Urine: NEGATIVE ng/mL
PCP Quant, Ur: NEGATIVE ng/mL

## 2021-09-10 NOTE — Telephone Encounter (Signed)
TC to Frederica Kuster at [facility] to inform of patient U/S appointment at [facility] M[facility] on 11/07/2021 at 1:00pm. Patient will need to arrive at the [facility] at [facility] at 12:45 on that date. Left message with Pam who states Darl Pikes is in a meeting and she will have Darl Pikes call back to get the appointment information.Burt Knack, RN

## 2021-09-11 ENCOUNTER — Encounter: Payer: Self-pay | Admitting: Advanced Practice Midwife

## 2021-09-11 DIAGNOSIS — K732 Chronic active hepatitis, not elsewhere classified: Secondary | ICD-10-CM | POA: Insufficient documentation

## 2021-09-11 LAB — CBC/D/PLT+RPR+RH+ABO+RUB AB...
Antibody Screen: NEGATIVE
Basophils Absolute: 0 x10E3/uL (ref 0.0–0.2)
Basos: 0 %
EOS (ABSOLUTE): 0.1 x10E3/uL (ref 0.0–0.4)
Eos: 1 %
Hematocrit: 30.1 % — ABNORMAL LOW (ref 34.0–46.6)
Hemoglobin: 9.9 g/dL — ABNORMAL LOW (ref 11.1–15.9)
Hepatitis B Surface Ag: NEGATIVE
Immature Grans (Abs): 0.1 x10E3/uL (ref 0.0–0.1)
Immature Granulocytes: 2 %
Lymphocytes Absolute: 1.6 x10E3/uL (ref 0.7–3.1)
Lymphs: 21 %
MCH: 31.4 pg (ref 26.6–33.0)
MCHC: 32.9 g/dL (ref 31.5–35.7)
MCV: 96 fL (ref 79–97)
Monocytes Absolute: 0.7 x10E3/uL (ref 0.1–0.9)
Monocytes: 10 %
Neutrophils Absolute: 5.1 x10E3/uL (ref 1.4–7.0)
Neutrophils: 66 %
Platelets: 221 x10E3/uL (ref 150–450)
RBC: 3.15 x10E6/uL — ABNORMAL LOW (ref 3.77–5.28)
RDW: 15.2 % (ref 11.7–15.4)
RPR Ser Ql: NONREACTIVE
Rh Factor: POSITIVE
Rubella Antibodies, IGG: 1.33 {index} (ref >0.99)
Varicella zoster IgG: 628 {index} (ref >165)
WBC: 7.7 x10E3/uL (ref 3.4–10.8)

## 2021-09-11 LAB — FE+CBC/D/PLT+TIBC+FER+RETIC
Ferritin: 27 ng/mL (ref 15–150)
Iron Saturation: 57 % — ABNORMAL HIGH (ref 15–55)
Iron: 259 ug/dL — ABNORMAL HIGH (ref 27–159)
Retic Ct Pct: 2.9 % — ABNORMAL HIGH (ref 0.6–2.6)
Total Iron Binding Capacity: 457 ug/dL — ABNORMAL HIGH (ref 250–450)
UIBC: 198 ug/dL (ref 131–425)

## 2021-09-11 LAB — HCV AB W REFLEX TO QUANT PCR: HCV Ab: >11 {s_co_ratio} — ABNORMAL HIGH (ref 0.0–0.9)

## 2021-09-11 LAB — HCV RT-PCR, QUANT (NON-GRAPH)
HCV log10: 3.212 log10 IU/mL
Hepatitis C Quantitation: 1630 IU/mL

## 2021-09-11 LAB — CHLAMYDIA/GC NAA, CONFIRMATION
Chlamydia trachomatis, NAA: NEGATIVE
Neisseria gonorrhoeae, NAA: NEGATIVE

## 2021-09-11 NOTE — Telephone Encounter (Signed)
TC to Lori Pollard at [facility] regarding U/S for this patient, scheduled at [facility] M[facility] for 11/07/2021 at 1:00pm. This was the earliest appointment available and provider E. Sciora is aware. Per Darl Pikes, patient made bond and is "out in the world". She states that they had ordered treatment for patient yeast infection and it came in today, so patient did not receive yeast medicine. Patient will need treatment for yeast and needs iron supplement. Per Darl Pikes, patient phone numbers are: [phone], and patient's mother Candy Reabes:[phone]. This numbers do not match numbers in Epic. RN will try to reach patient to schedule next MH RV and an appointment for patient to come to clinic for iron and yeast medication, and give patient U/S date/time information.Burt Knack, RN

## 2021-09-12 ENCOUNTER — Encounter: Payer: Self-pay | Admitting: Family Medicine

## 2021-09-12 DIAGNOSIS — O98419 Viral hepatitis complicating pregnancy, unspecified trimester: Secondary | ICD-10-CM | POA: Insufficient documentation

## 2021-09-12 DIAGNOSIS — B182 Chronic viral hepatitis C: Secondary | ICD-10-CM | POA: Insufficient documentation

## 2021-09-13 ENCOUNTER — Telehealth: Payer: Self-pay

## 2021-09-13 LAB — URINE CULTURE: Organism ID, Bacteria: NO GROWTH

## 2021-09-13 NOTE — Telephone Encounter (Signed)
TC to patient to schedule next RV and to inform of U/S date/time. Unable to LM on cell, no answer, no connection. TC to home phone number, VM full, unable to LM.Marland KitchenBurt Knack, RN

## 2021-09-14 ENCOUNTER — Emergency Department
Admission: EM | Admit: 2021-09-14 | Discharge: 2021-09-14 | Discharge status: Against Medical Advice | Payer: Medicaid Other

## 2021-09-14 NOTE — ED Triage Notes (Signed)
Pt called x's 3 for triage, no response 

## 2021-09-14 NOTE — ED Triage Notes (Signed)
Pt called for triage, no response. 
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
## 2021-09-16 ENCOUNTER — Other Ambulatory Visit: Payer: Self-pay | Admitting: Advanced Practice Midwife

## 2021-09-16 DIAGNOSIS — F3131 Bipolar disorder, current episode depressed, mild: Secondary | ICD-10-CM

## 2021-09-17 LAB — LEAD, BLOOD (ADULT >= 16 YRS): Lead-Whole Blood: <1 ug/dL (ref 0.0–3.4)

## 2021-09-17 LAB — HGB FRACTIONATION CASCADE
Hgb A2: 2.7 % (ref 1.8–3.2)
Hgb A: 97.3 % (ref 96.4–98.8)
Hgb F: 0 % (ref 0.0–2.0)
Hgb S: 0 %

## 2021-09-17 LAB — QUANTIFERON-TB GOLD PLUS
QuantiFERON Mitogen Value: 2.75 IU/mL
QuantiFERON Nil Value: 0.04 IU/mL
QuantiFERON TB1 Ag Value: 0.03 IU/mL
QuantiFERON TB2 Ag Value: 0.03 IU/mL
QuantiFERON-TB Gold Plus: NEGATIVE

## 2021-09-17 LAB — HIV-1/HIV-2 QUALITATIVE RNA
HIV-1 RNA, Qualitative: NONREACTIVE
HIV-2 RNA, Qualitative: NONREACTIVE

## 2021-09-17 NOTE — Telephone Encounter (Signed)
Call to patient to schedule next RV and to inform of U/S date/time.   Unable to LM on cell, no answer, no connection.   TC to home phone number, VM full, unable to LM.  Attempt to contact Candy (patient's mother) at phone number [phone] obtained by Darl Pikes. Per Drue Flirt this is Tina's phone number: [phone]. Left message with Drue Flirt to have patient call [facility] at [phone] to scheduled an appointment.   Call to patient at this new number (will update number in Epic system). No answer. Voicemail not set up.   Floy Sabina, RN

## 2021-09-19 ENCOUNTER — Telehealth: Payer: Self-pay

## 2021-09-19 NOTE — Telephone Encounter (Signed)
Call to patient to schedule next RV and to inform of U/S date/time. Ask patient if she is willing to get [place] in  to be seen sooner.   Call patient's updated number given by [place] from her mother. Call ranged and then said "phone number not on service".  Call to patient's home phone number and left above message.   Floy Sabina, RN

## 2021-09-23 NOTE — Addendum Note (Signed)
Addended by: Heywood Bene on: 09/23/2021 01:55 PM   Modules accepted: Orders

## 2021-09-25 NOTE — Telephone Encounter (Signed)
Call to client discuss 1) [facility] [place] appt date (desire in [place] if can be seen earlier?)  2) needs [facility] RV appt,  3) offer tx of prenatal care to [facility] in [place] as can receive suboxone treatment.  Call to cell - not available per recorded message. Call to home number - left message to call with number provided. Call to emergency contact (mother) who states will try to contact client to call [facility]. Jossie Ng, RN

## 2021-09-27 NOTE — Telephone Encounter (Signed)
Patient called, returning our phone calls. She is now scheduled to return for [facility] appointment and informed of [facility] U/S on 11/07/2021. Patient states she is willing to go to [facility] for her U/S if they can get her in sooner. Patient informed RN with try to get her an earlier date for U/S in [place]. Patient expressed interest in transferring [facility] to [facility], and agreed to discuss at next RV. Also interested in talking with AMariana Kaufman, referral was made and patient phone number updated in chart. Patient given phone number to call A. Mariana Kaufman and message sent to A. Mariana Kaufman to let her know patient new phone and interest in visit. Burt Knack, RN

## 2021-09-30 ENCOUNTER — Telehealth: Payer: Self-pay

## 2021-09-30 NOTE — Telephone Encounter (Signed)
TC x 2 today to patient to inform of [facility] U/S in [place] on 10/18/2021 at 1:15pm. Per 1st TC earlier today, female answered the phone and was given the number for patient to call. He stated he would see her in 15 minutes and give her the message. 2nd phone call, LM with number to call.Burt Knack, RN

## 2021-10-02 NOTE — Telephone Encounter (Signed)
TC to patient to inform of U/S appointment. LM with number to call.Burt Knack, RN

## 2021-10-07 ENCOUNTER — Ambulatory Visit: Payer: Medicaid Other

## 2021-10-07 NOTE — Telephone Encounter (Signed)
TC to patient to reschedule missed MH RV and to inform of U/S appointment. LM with number to call.Burt Knack, RN

## 2021-10-08 ENCOUNTER — Ambulatory Visit: Payer: Medicaid Other | Admitting: Physician Assistant

## 2021-10-10 ENCOUNTER — Ambulatory Visit: Admit: 2021-10-10 | Discharge status: Against Medical Advice | Payer: Medicaid Other

## 2021-10-10 NOTE — Telephone Encounter (Signed)
TC to patient at [phone], LM with number to call. TC to [phone], rings, unable to LM. TC to emergency contact, patient's mother. Her mother, Drue Flirt, states she will try to reach patient and give her our number to call for U/S appointment. She was given the number and counseled to let patient know to ask for a maternity nurse. She states understanding.Marland KitchenMarland KitchenBurt Knack, RN

## 2021-10-11 ENCOUNTER — Telehealth: Payer: Medicaid Other | Admitting: Physician Assistant

## 2021-10-11 ENCOUNTER — Telehealth: Payer: Self-pay

## 2021-10-11 ENCOUNTER — Ambulatory Visit: Payer: Medicaid Other

## 2021-10-11 DIAGNOSIS — B86 Scabies: Secondary | ICD-10-CM

## 2021-10-11 MED ORDER — PERMETHRIN 5 % EX CREA: TOPICAL_CREAM | CUTANEOUS | 0 refills | Status: DC

## 2021-10-11 NOTE — Telephone Encounter (Signed)
Darrol Poke MSW / OBCM able to contact client and [facility] RV appt scheduled for this pm. Per Ms. Marlan Palau, client is aware of [facility] [place] appt as saw on my chart. Jossie Ng, RN

## 2021-10-11 NOTE — Progress Notes (Signed)
E-Visit for Scabies  We are sorry that you are not feeling well. Here is how we plan to help!  Based on what you shared with me it looks like you have scabies.  Scabies is an infection caused by very tiny mites that burrow into the skin.  They cause severe itching. Though children are most commonly infected, anyone can get scabies.  Scabies mites can pass from person to person through physical close contact.  They can also be passed through shared clothing, towels, and bedding.  Scabies infection is not usually dangerous, but it is uncomfortable.  Because it is so contagious, scabies should be treated immediately to keep the infection from spreading.  If you have children in day care, please have any exposed children examined by their pediatrician. .   I have prescribed Permethrin topical cream 5% thoroughly massage cream from head to soles of feet; leave on for 8 to 14 hours before removing (shower or bath). May repeat if living mites are observed 14 days after first treatment; one application is generally curative.    HOME CARE:  You should treat all members in your household whether they show symptoms or not. Wash towels, clothes, bed linens, cloth toys, hats, and other personal items in hot water and dry on high heat. Vacuum floors and furniture and throw away the bag afterwards. Keep your child home from day care or school until the morning after treatment for scabies. Notify your child's day care or school so that other children can be checked and treated.  GET HELP RIGHT AWAY IF:  The infected person has a fever, red streaks, pain, or swelling of the skin. Sores get worse or don't heal. New rashes appear or itching continues for more than 2 weeks after treatment.  MAKE SURE YOU:  Understand these instructions. Will watch your condition. Will get help right away if you are not doing well or get worse.  Thank you for choosing an e-visit.  Your e-visit answers were reviewed by a  board certified advanced clinical practitioner to complete your personal care plan. Depending upon the condition, your plan could have included both over the counter or prescription medications.  Please review your pharmacy choice. Make sure the pharmacy is open so you can pick up prescription now. If there is a problem, you may contact your provider through MyChart messaging and have the prescription routed to another pharmacy.  Your safety is important to us. If you have drug allergies check your prescription carefully.   For the next 24 hours you can use MyChart to ask questions about today's visit, request a non-urgent call back, or ask for a work or school excuse. You will get an email in the next two days asking about your experience. I hope that your e-visit has been valuable and will speed your recovery.  I provided 5 minutes of non face-to-face time during this encounter for chart review and documentation.   

## 2021-10-11 NOTE — Telephone Encounter (Signed)
Per Arlana Hove patient called her stating she is unable to make it for her appointment today as she is states she was diagnosed with scabies today per e-visit and just received treatment today as well. Patient states she was told to quarantine 24 hours.   Discussed situation with  provider, E. Sciora CNM and she determined it was best to reschedule patient appointment.    Appointment rescheduled for 09/17/21  at 1:40. As patient states that day and time works best for her.   Patient has been made aware by [facility] regarding new appointment.   Floy Sabina, RN

## 2021-10-17 ENCOUNTER — Other Ambulatory Visit: Payer: Self-pay

## 2021-10-17 ENCOUNTER — Ambulatory Visit: Payer: Medicaid Other | Admitting: Advanced Practice Midwife

## 2021-10-17 DIAGNOSIS — F1120 Opioid dependence, uncomplicated: Secondary | ICD-10-CM

## 2021-10-17 DIAGNOSIS — F1290 Cannabis use, unspecified, uncomplicated: Secondary | ICD-10-CM

## 2021-10-17 DIAGNOSIS — O98412 Viral hepatitis complicating pregnancy, second trimester: Secondary | ICD-10-CM | POA: Diagnosis not present

## 2021-10-17 DIAGNOSIS — F1110 Opioid abuse, uncomplicated: Secondary | ICD-10-CM

## 2021-10-17 DIAGNOSIS — O0992 Supervision of high risk pregnancy, unspecified, second trimester: Secondary | ICD-10-CM | POA: Diagnosis not present

## 2021-10-17 DIAGNOSIS — O99012 Anemia complicating pregnancy, second trimester: Secondary | ICD-10-CM

## 2021-10-17 DIAGNOSIS — F1510 Other stimulant abuse, uncomplicated: Secondary | ICD-10-CM

## 2021-10-17 DIAGNOSIS — O99320 Drug use complicating pregnancy, unspecified trimester: Secondary | ICD-10-CM

## 2021-10-17 DIAGNOSIS — Z8751 Personal history of pre-term labor: Secondary | ICD-10-CM

## 2021-10-17 DIAGNOSIS — O98419 Viral hepatitis complicating pregnancy, unspecified trimester: Secondary | ICD-10-CM

## 2021-10-17 DIAGNOSIS — K732 Chronic active hepatitis, not elsewhere classified: Secondary | ICD-10-CM

## 2021-10-17 DIAGNOSIS — B182 Chronic viral hepatitis C: Secondary | ICD-10-CM

## 2021-10-17 DIAGNOSIS — K089 Disorder of teeth and supporting structures, unspecified: Secondary | ICD-10-CM

## 2021-10-17 DIAGNOSIS — O0991 Supervision of high risk pregnancy, unspecified, first trimester: Secondary | ICD-10-CM

## 2021-10-17 DIAGNOSIS — F1410 Cocaine abuse, uncomplicated: Secondary | ICD-10-CM

## 2021-10-17 DIAGNOSIS — F17200 Nicotine dependence, unspecified, uncomplicated: Secondary | ICD-10-CM
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
## 2021-10-17 NOTE — Progress Notes (Signed)
Desires BTL, and given new OB pack. Patient unsure about delivering hospital, and was given an [facility] pack with [facility] booklet and phone numbers.Marland KitchenMarland KitchenBurt Knack, RN

## 2021-10-17 NOTE — Progress Notes (Addendum)
Patient here for MH RV at 15 3/7. Patient c/o pain, raised redness on both forearms. Also states she is itching all over. Has questions about Hep C. Patient aware of U/S at [facility] in [place].  Hgb 9.9 and moderate yeast on 09/09/2021. Taking PNV. States she had video conference call and was prescribed medication for scabies, but the medication made her face "felt like windburn". No longer taking that medication.. States she has [facility] and waiting for their interview call. Burt Knack, RN

## 2021-10-18 ENCOUNTER — Ambulatory Visit: Payer: Medicaid Other

## 2021-10-18 ENCOUNTER — Telehealth: Payer: Self-pay

## 2021-10-18 NOTE — Telephone Encounter (Signed)
TC to patient to ask about where she wants to receive her weekly 17-P injections. Per provider notes from most recent visit, patient interested in 17-P, as she has a history of preterm birth. LM with number to call.Burt Knack, RN

## 2021-10-21 ENCOUNTER — Encounter: Payer: Self-pay | Admitting: Family Medicine

## 2021-10-21 NOTE — Progress Notes (Unsigned)
Patient ID: Lori Pollard, female   DOB: 1991-03-16, 30 y.o.   MRN: [id number]  [facility] Department Maternity Care Conference  Maternity Care Conference Date: 10/21/21  Lori Pollard was identified by clinical staff to benefit from an interdisciplinary team approach to help improve pregnancy care.  The [facility] Maternity Care Conference includes the maternity clinic coordinator (RN), medical providers (MD/APP staff), Care Management -OBCM and Healthy Beginnings, Centering Pregnancy coordinator, Infant Mortality reduction Dietitian.  Nursing staff are also encouraged to participate. The group meets monthly to discuss patient care and coordinate services.   The patient's care care at the agency was reviewed in EMR and high risk factors evaluated in an interdisciplinary approach.    Value added interventions discussed at this care conference today were:   [facility] Meeting 10/17/21 Patient has not been seen for prenatal care  Unable to follow-up with case worker R.T.  Therapist A.M was unable to contact patient as well  Patient has substance abuse issues  L.Synetta Fail

## 2021-10-21 NOTE — Progress Notes (Signed)
[facility] referral faxed with confirmation. Patient phone numbers on referral..Maeven Mcdougall Brewer-Jensen, RN

## 2021-10-22 ENCOUNTER — Encounter: Payer: Medicaid Other | Admitting: Family Medicine

## 2021-10-23 ENCOUNTER — Ambulatory Visit: Payer: Medicaid Other

## 2021-10-23 ENCOUNTER — Telehealth: Payer: Self-pay

## 2021-10-23 NOTE — Telephone Encounter (Signed)
[facility] for MHC RV 10/23/2021. Call to client's cell number and left message to call with number provided. Call to home number and no voicemail set up per recorded message. Jossie Ng, RN

## 2021-10-24 NOTE — Telephone Encounter (Signed)
Per [facility], client has new OB appt at [facility] in [place] on 11/13/2021 at 0955. Jossie Ng, RN

## 2021-10-25 ENCOUNTER — Telehealth: Payer: Self-pay

## 2021-10-25 ENCOUNTER — Encounter: Payer: Self-pay | Admitting: Family Medicine

## 2021-10-25 NOTE — Telephone Encounter (Signed)
Per Ike Bene, [facility] clerical intake, she has form to complete and send to [facility] regarding changing client's [place]. Per Misty Stanley, she will call Medicaid regarding form. Jossie Ng, RN

## 2021-10-25 NOTE — Progress Notes (Signed)
Discussed case with Dr. Zack Seal. Patient desires to start suboxone. [facility] is better location to consolidate care with multiple suboxone prescribers.   Patient was scheduled for 1/4 intake/ new OB visit  Dr Crissie Reese was going to try to fit patient in sooner for starting MAT and will reach out.

## 2021-10-25 NOTE — Telephone Encounter (Signed)
Call to [facility] to ascertain if appt scheduled as referral faxed 10/21/2021 with confirmation received. Per clerk, fax not received. Referral re-faxed with snapshot pages and fax confirmation received. RN also was told that client in their system and had appt in August 2022 that she cancelled. Per clerk, unable to accept referral as Medicaid provider on card must match referring provider on referral. Call to M. Tacey Heap in [facility] [employer] to ascertain if [facility] needed to do anything regarding Medicaid being [place] and left message requesting she call [facility]. Jossie Ng, RN

## 2021-10-25 NOTE — Telephone Encounter (Signed)
See other phone note.Burt Knack, RN

## 2021-10-25 NOTE — Telephone Encounter (Signed)
Per Donna Bernard, form that needs completion and turned into Medicaid given to Ike Bene ([facility] clerical). Jossie Ng, RN

## 2021-10-25 NOTE — Telephone Encounter (Signed)
TC to patient regarding 17-P. Patient needs to sign her 17-P forms and decide if she wants to receive it here at [facility] or at [facility] in [place]. She has her transfer of care appointment in [place] on 11/13/2020 and missed her RV on 10/23/2021. Patient needs appt next week for RV and to talk about 17-P and sign forms. LM with number to call. TC to patient's mother, emergency contact and asked her to have patient call us asap. Number to call given, and per her mother, the [phone] number in patient's chart is her best number to call. Patient's mother states she will give patient the message to call.Burt Knack, RN

## 2021-10-25 NOTE — Telephone Encounter (Signed)
Return call from M. Tacey Heap who states will check on Medicaid [place] to determine what, if anything needs to be done to allow [facility] to accept [facility] referral. Jossie Ng, RN

## 2021-11-07 ENCOUNTER — Ambulatory Visit: Payer: Medicaid Other

## 2021-11-13 ENCOUNTER — Ambulatory Visit (INDEPENDENT_AMBULATORY_CARE_PROVIDER_SITE_OTHER): Payer: Medicaid Other | Admitting: Family Medicine

## 2021-11-13 DIAGNOSIS — Z91199 Patient's noncompliance with other medical treatment and regimen due to unspecified reason: Secondary | ICD-10-CM

## 2021-11-13 NOTE — Progress Notes (Signed)
Called patient as she did not present for her appt. She reports she was unaware of her appt this morning, thought she only had MFM visit tomorrow (which she does).   Unable to come earlier than tomorrow and I am not in clinic, nor is there space on the schedule. She can come next Friday at 11:15 AM, message sent to reschedule.   Subsequently told me that she has been having a large amount of swelling in just her R leg for the past three days. Then told me she has developed swelling in her L leg as well today.   I recommended she come for an evaluation ASAP for possible DVT. She spent some time trying to get a ride and was unable to so is planning to call an ambulance which I am in agreement with.

## 2021-11-14 ENCOUNTER — Ambulatory Visit: Payer: Medicaid Other | Attending: Advanced Practice Midwife | Admitting: *Deleted

## 2021-11-14 ENCOUNTER — Encounter: Payer: Self-pay | Admitting: Advanced Practice Midwife

## 2021-11-14 ENCOUNTER — Other Ambulatory Visit: Payer: Self-pay

## 2021-11-14 ENCOUNTER — Ambulatory Visit (HOSPITAL_BASED_OUTPATIENT_CLINIC_OR_DEPARTMENT_OTHER): Payer: Medicaid Other

## 2021-11-14 VITALS — BP 123/76 | HR 105

## 2021-11-14 DIAGNOSIS — O98419 Viral hepatitis complicating pregnancy, unspecified trimester: Secondary | ICD-10-CM

## 2021-11-14 DIAGNOSIS — Z3687 Encounter for antenatal screening for uncertain dates: Secondary | ICD-10-CM | POA: Diagnosis not present

## 2021-11-14 DIAGNOSIS — B182 Chronic viral hepatitis C: Secondary | ICD-10-CM | POA: Insufficient documentation

## 2021-11-14 DIAGNOSIS — Z363 Encounter for antenatal screening for malformations: Secondary | ICD-10-CM | POA: Diagnosis not present

## 2021-11-14 DIAGNOSIS — O34219 Maternal care for unspecified type scar from previous cesarean delivery: Secondary | ICD-10-CM | POA: Insufficient documentation

## 2021-11-14 DIAGNOSIS — O0991 Supervision of high risk pregnancy, unspecified, first trimester: Secondary | ICD-10-CM

## 2021-11-14 DIAGNOSIS — F17200 Nicotine dependence, unspecified, uncomplicated: Secondary | ICD-10-CM | POA: Insufficient documentation

## 2021-11-14 DIAGNOSIS — O98412 Viral hepatitis complicating pregnancy, second trimester: Secondary | ICD-10-CM | POA: Diagnosis present

## 2021-11-14 DIAGNOSIS — O99012 Anemia complicating pregnancy, second trimester: Secondary | ICD-10-CM

## 2021-11-14 DIAGNOSIS — O0992 Supervision of high risk pregnancy, unspecified, second trimester: Secondary | ICD-10-CM | POA: Insufficient documentation

## 2021-11-14 DIAGNOSIS — Z3A24 24 weeks gestation of pregnancy: Secondary | ICD-10-CM | POA: Insufficient documentation

## 2021-11-14 DIAGNOSIS — O99332 Smoking (tobacco) complicating pregnancy, second trimester: Secondary | ICD-10-CM | POA: Insufficient documentation

## 2021-11-15 ENCOUNTER — Other Ambulatory Visit: Payer: Self-pay | Admitting: *Deleted

## 2021-11-15 DIAGNOSIS — F1910 Other psychoactive substance abuse, uncomplicated: Secondary | ICD-10-CM

## 2021-11-15 DIAGNOSIS — Z3492 Encounter for supervision of normal pregnancy, unspecified, second trimester: Secondary | ICD-10-CM

## 2021-11-15 DIAGNOSIS — Z3689 Encounter for other specified antenatal screening: Secondary | ICD-10-CM

## 2021-11-22 ENCOUNTER — Encounter: Payer: Medicaid Other | Admitting: Family Medicine

## 2021-11-27 ENCOUNTER — Encounter (INDEPENDENT_AMBULATORY_CARE_PROVIDER_SITE_OTHER): Payer: Self-pay

## 2021-11-28 ENCOUNTER — Telehealth: Payer: Medicaid Other | Admitting: Physician Assistant

## 2021-11-28 DIAGNOSIS — L03115 Cellulitis of right lower limb: Secondary | ICD-10-CM

## 2021-11-28 DIAGNOSIS — F1910 Other psychoactive substance abuse, uncomplicated: Secondary | ICD-10-CM

## 2021-11-28 DIAGNOSIS — S81801A Unspecified open wound, right lower leg, initial encounter: Secondary | ICD-10-CM | POA: Diagnosis not present

## 2021-11-28 DIAGNOSIS — Z3A26 26 weeks gestation of pregnancy: Secondary | ICD-10-CM | POA: Diagnosis not present

## 2021-11-28 MED ORDER — CEPHALEXIN 500 MG PO CAPS: 500.0000 mg | ORAL_CAPSULE | Freq: Three times a day (TID) | ORAL | 0 refills | Status: AC

## 2021-11-28 NOTE — Patient Instructions (Signed)
°  Cherylann Parr, thank you for joining Piedad Climes, PA-C for today's virtual visit.  While this provider is not your primary care provider (PCP), if your PCP is located in our provider database this encounter information will be shared with them immediately following your visit.  Consent: (Patient) Lori Pollard provided verbal consent for this virtual visit at the beginning of the encounter.  Current Medications:  Current Outpatient Medications:    ferrous sulfate 325 (65 FE) MG tablet, Take 325 mg by mouth daily with breakfast., Disp: , Rfl:    Prenatal Vit-Fe Fumarate-FA (MULTIVITAMIN-PRENATAL) 27-0.8 MG TABS tablet, Take 1 tablet by mouth daily at 12 noon., Disp: , Rfl:    Medications ordered in this encounter:  No orders of the defined types were placed in this encounter.    *If you need refills on other medications prior to your next appointment, please contact your pharmacy*  Follow-Up: Call back or seek an in-person evaluation if the symptoms worsen or if the condition fails to improve as anticipated.  Other Instructions Start antibiotic and take as directed. Keep the skin clean and dry. Avoid picking. You must be seen in person tomorrow. If your provider cannot get you in, you need to be seen at nearest Urgent Care or ER. If anything worsens or you note fever, you need ER evaluation ASAP. Please for your safety and baby's safety, DO NOT DELAY CARE. Continue to refrain from substance use as it increases risks to your and child's health.    If you have been instructed to have an in-person evaluation today at a local Urgent Care facility, please use the link below. It will take you to a list of all of our available [facility], including address, phone number and hours of operation. Please do not delay care.  [facility]  If you or a family member do not have a primary care provider, use the link below to schedule a visit  and establish care. When you choose a [place] primary care physician or advanced practice provider, you gain a long-term partner in health. Find a Primary Care Provider  Learn more about [place]'s in-office and virtual care options:  - Get Care Now
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
## 2021-11-29 ENCOUNTER — Ambulatory Visit: Admit: 2021-11-29 | Discharge status: Against Medical Advice | Payer: Medicaid Other

## 2021-11-29 ENCOUNTER — Ambulatory Visit: Payer: Medicaid Other

## 2021-11-29 NOTE — Telephone Encounter (Signed)
Closing phone note. Patient has transferred care to [facility] as of the new year and is currently too advanced in pregnancy to begin 17-P.Marland KitchenBurt Knack, RN

## 2021-12-12 ENCOUNTER — Ambulatory Visit: Payer: Medicaid Other

## 2021-12-16 ENCOUNTER — Ambulatory Visit: Payer: Medicaid Other | Attending: Obstetrics

## 2021-12-16 ENCOUNTER — Ambulatory Visit: Payer: Medicaid Other

## 2021-12-17 ENCOUNTER — Other Ambulatory Visit: Payer: Self-pay

## 2021-12-17 ENCOUNTER — Encounter: Payer: Self-pay | Admitting: Obstetrics and Gynecology

## 2021-12-17 ENCOUNTER — Observation Stay
Admission: EM | Admit: 2021-12-17 | Discharge: 2021-12-17 | Disposition: A | Discharge status: Home / Self Care | Payer: Medicaid Other | Attending: Obstetrics and Gynecology | Admitting: Obstetrics and Gynecology

## 2021-12-17 DIAGNOSIS — O99891 Other specified diseases and conditions complicating pregnancy: Principal | ICD-10-CM | POA: Insufficient documentation

## 2021-12-17 DIAGNOSIS — O99333 Smoking (tobacco) complicating pregnancy, third trimester: Secondary | ICD-10-CM | POA: Insufficient documentation

## 2021-12-17 DIAGNOSIS — Z79899 Other long term (current) drug therapy: Secondary | ICD-10-CM | POA: Insufficient documentation

## 2021-12-17 DIAGNOSIS — O26893 Other specified pregnancy related conditions, third trimester: Secondary | ICD-10-CM | POA: Diagnosis not present

## 2021-12-17 DIAGNOSIS — O99323 Drug use complicating pregnancy, third trimester: Secondary | ICD-10-CM

## 2021-12-17 DIAGNOSIS — R030 Elevated blood-pressure reading, without diagnosis of hypertension: Secondary | ICD-10-CM | POA: Diagnosis not present

## 2021-12-17 DIAGNOSIS — K732 Chronic active hepatitis, not elsewhere classified: Secondary | ICD-10-CM

## 2021-12-17 DIAGNOSIS — O163 Unspecified maternal hypertension, third trimester: Secondary | ICD-10-CM | POA: Diagnosis present

## 2021-12-17 DIAGNOSIS — R600 Localized edema: Secondary | ICD-10-CM | POA: Diagnosis not present

## 2021-12-17 DIAGNOSIS — L03119 Cellulitis of unspecified part of limb: Secondary | ICD-10-CM

## 2021-12-17 DIAGNOSIS — F1410 Cocaine abuse, uncomplicated: Secondary | ICD-10-CM

## 2021-12-17 DIAGNOSIS — Z23 Encounter for immunization: Secondary | ICD-10-CM | POA: Insufficient documentation

## 2021-12-17 DIAGNOSIS — O99713 Diseases of the skin and subcutaneous tissue complicating pregnancy, third trimester: Secondary | ICD-10-CM | POA: Diagnosis not present

## 2021-12-17 DIAGNOSIS — O0933 Supervision of pregnancy with insufficient antenatal care, third trimester: Secondary | ICD-10-CM

## 2021-12-17 DIAGNOSIS — O98419 Viral hepatitis complicating pregnancy, unspecified trimester: Secondary | ICD-10-CM

## 2021-12-17 DIAGNOSIS — B182 Chronic viral hepatitis C: Secondary | ICD-10-CM

## 2021-12-17 DIAGNOSIS — F17210 Nicotine dependence, cigarettes, uncomplicated: Secondary | ICD-10-CM | POA: Insufficient documentation

## 2021-12-17 DIAGNOSIS — L03115 Cellulitis of right lower limb: Secondary | ICD-10-CM | POA: Insufficient documentation

## 2021-12-17 DIAGNOSIS — O98813 Other maternal infectious and parasitic diseases complicating pregnancy, third trimester: Secondary | ICD-10-CM

## 2021-12-17 DIAGNOSIS — Z3A29 29 weeks gestation of pregnancy: Secondary | ICD-10-CM | POA: Insufficient documentation

## 2021-12-17 DIAGNOSIS — O1203 Gestational edema, third trimester: Secondary | ICD-10-CM | POA: Insufficient documentation

## 2021-12-17 DIAGNOSIS — O99012 Anemia complicating pregnancy, second trimester: Secondary | ICD-10-CM

## 2021-12-17 LAB — CBC
HCT: 28.9 % — ABNORMAL LOW (ref 36.0–46.0)
Hemoglobin: 9.4 g/dL — ABNORMAL LOW (ref 12.0–15.0)
MCH: 27.9 pg (ref 26.0–34.0)
MCHC: 32.5 g/dL (ref 30.0–36.0)
MCV: 85.8 fL (ref 80.0–100.0)
Platelets: 167 10*3/uL (ref 150–400)
RBC: 3.37 MIL/uL — ABNORMAL LOW (ref 3.87–5.11)
RDW: 13.1 % (ref 11.5–15.5)
WBC: 12.1 10*3/uL — ABNORMAL HIGH (ref 4.0–10.5)
nRBC: 0 % (ref 0.0–0.2)

## 2021-12-17 LAB — COMPREHENSIVE METABOLIC PANEL
ALT: 39 U/L (ref 0–44)
AST: 53 U/L — ABNORMAL HIGH (ref 15–41)
Albumin: 2.5 g/dL — ABNORMAL LOW (ref 3.5–5.0)
Alkaline Phosphatase: 203 U/L — ABNORMAL HIGH (ref 38–126)
Anion gap: 6 (ref 5–15)
BUN: 8 mg/dL (ref 6–20)
CO2: 24 mmol/L (ref 22–32)
Calcium: 8.3 mg/dL — ABNORMAL LOW (ref 8.9–10.3)
Chloride: 103 mmol/L (ref 98–111)
Creatinine, Ser: 0.74 mg/dL (ref 0.44–1.00)
GFR, Estimated: >60 mL/min (ref >60)
Glucose, Bld: 91 mg/dL (ref 70–99)
Potassium: 3.6 mmol/L (ref 3.5–5.1)
Sodium: 133 mmol/L — ABNORMAL LOW (ref 135–145)
Total Bilirubin: 0.4 mg/dL (ref 0.3–1.2)
Total Protein: 7 g/dL (ref 6.5–8.1)

## 2021-12-17 LAB — URINE DRUG SCREEN, QUALITATIVE (ARMC ONLY)
Amphetamines, Ur Screen: NOT DETECTED
Barbiturates, Ur Screen: NOT DETECTED
Benzodiazepine, Ur Scrn: NOT DETECTED
Cannabinoid 50 Ng, Ur ~~LOC~~: NOT DETECTED
Cocaine Metabolite,Ur ~~LOC~~: POSITIVE — AB
MDMA (Ecstasy)Ur Screen: NOT DETECTED
Methadone Scn, Ur: NOT DETECTED
Opiate, Ur Screen: NOT DETECTED
Phencyclidine (PCP) Ur S: NOT DETECTED
Tricyclic, Ur Screen: NOT DETECTED

## 2021-12-17 LAB — PROTEIN / CREATININE RATIO, URINE
Creatinine, Urine: 44 mg/dL
Protein Creatinine Ratio: 0.2 mg/mg{Cre} — ABNORMAL HIGH (ref 0.00–0.15)
Total Protein, Urine: 9 mg/dL

## 2021-12-17 MED ORDER — FUROSEMIDE 20 MG PO TABS: 20.0000 mg | ORAL_TABLET | Freq: Every day | ORAL | 0 refills | Status: DC

## 2021-12-17 MED ORDER — CEFTRIAXONE SODIUM 1 G IJ SOLR
1.0000 g | Freq: Once | INTRAMUSCULAR | Status: AC
  Administered 2021-12-17: 1 g via INTRAMUSCULAR
  Filled 2021-12-17: qty 10

## 2021-12-17 MED ORDER — TETANUS-DIPHTH-ACELL PERTUSSIS 5-2.5-18.5 LF-MCG/0.5 IM SUSY
0.5000 mL | PREFILLED_SYRINGE | Freq: Once | INTRAMUSCULAR | Status: AC
  Administered 2021-12-17: 0.5 mL via INTRAMUSCULAR
  Filled 2021-12-17: qty 0.5

## 2021-12-17 MED ORDER — AMOXICILLIN-POT CLAVULANATE 875-125 MG PO TABS: 1.0000 | ORAL_TABLET | Freq: Two times a day (BID) | ORAL | 1 refills | Status: DC

## 2021-12-17 MED ORDER — FUROSEMIDE 20 MG PO TABS
20.0000 mg | ORAL_TABLET | Freq: Once | ORAL | Status: AC
  Administered 2021-12-17: 20 mg via ORAL
  Filled 2021-12-17: qty 1

## 2021-12-17 MED ORDER — NIFEDIPINE 10 MG PO CAPS: 10.0000 mg | ORAL_CAPSULE | ORAL | Status: DC | PRN

## 2021-12-17 MED ORDER — LABETALOL HCL 5 MG/ML IV SOLN: 40.0000 mg | INTRAVENOUS | Status: DC | PRN

## 2021-12-17 MED ORDER — NIFEDIPINE 10 MG PO CAPS: 20.0000 mg | ORAL_CAPSULE | ORAL | Status: DC | PRN

## 2021-12-17 NOTE — Progress Notes (Signed)
Discharge instructions provided to patient. Patient verbalized understanding. Pt educated on signs and symptoms of labor, vaginal bleeding, LOF, and fetal movement. Red flag signs reviewed by RN. Patient discharged home in stable condition. 

## 2021-12-17 NOTE — Progress Notes (Signed)
RN went in to check on patient and encourage her again to give [place] a urine sample. Pt states I can't pee right now but may be able to in a minute. Pt is very sleepy compared to when she got here. Will continue to monitor.

## 2021-12-17 NOTE — OB Triage Note (Signed)
Pt presents c/o increased BP 150/106 last night at 7:45 pm. Pt also states she has also had increased leg swelling over the last 4 days. Pt has +3 pitting edema with noted sores on right leg. Pt also has a beat of clonus in each foot. Pt has brisk reflexes. Initial BP 124/78. BPs cycling q15 mins. Pt denies headache or epigastric pain and states she has had a little blurry vision. Pt denies bleeding or LOf. Reports positive fetal movement. All other VSS. Will continue to monitor.
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
## 2021-12-29 ENCOUNTER — Other Ambulatory Visit: Payer: Self-pay | Admitting: Obstetrics and Gynecology

## 2021-12-30 NOTE — Telephone Encounter (Signed)
I only took care of this patient during a hospital triage visit in the hospital. If she desires further management/refills she needs to follow up with her OB/GYN in [place].

## 2022-01-07 ENCOUNTER — Encounter: Payer: Self-pay | Admitting: Obstetrics and Gynecology

## 2022-01-07 ENCOUNTER — Other Ambulatory Visit: Payer: Self-pay

## 2022-01-07 ENCOUNTER — Observation Stay
Admission: EM | Admit: 2022-01-07 | Discharge: 2022-01-07 | Disposition: A | Discharge status: Home / Self Care | Payer: Medicaid Other | Attending: Obstetrics and Gynecology | Admitting: Obstetrics and Gynecology

## 2022-01-07 ENCOUNTER — Observation Stay: Payer: Medicaid Other

## 2022-01-07 DIAGNOSIS — Z3490 Encounter for supervision of normal pregnancy, unspecified, unspecified trimester: Secondary | ICD-10-CM

## 2022-01-07 DIAGNOSIS — R519 Headache, unspecified: Secondary | ICD-10-CM | POA: Insufficient documentation

## 2022-01-07 DIAGNOSIS — O98413 Viral hepatitis complicating pregnancy, third trimester: Secondary | ICD-10-CM | POA: Diagnosis not present

## 2022-01-07 DIAGNOSIS — R238 Other skin changes: Secondary | ICD-10-CM | POA: Diagnosis not present

## 2022-01-07 DIAGNOSIS — B182 Chronic viral hepatitis C: Secondary | ICD-10-CM

## 2022-01-07 DIAGNOSIS — O34219 Maternal care for unspecified type scar from previous cesarean delivery: Secondary | ICD-10-CM | POA: Diagnosis not present

## 2022-01-07 DIAGNOSIS — Z3A32 32 weeks gestation of pregnancy: Secondary | ICD-10-CM | POA: Insufficient documentation

## 2022-01-07 DIAGNOSIS — F1910 Other psychoactive substance abuse, uncomplicated: Secondary | ICD-10-CM

## 2022-01-07 DIAGNOSIS — O99012 Anemia complicating pregnancy, second trimester: Secondary | ICD-10-CM

## 2022-01-07 DIAGNOSIS — O0933 Supervision of pregnancy with insufficient antenatal care, third trimester: Secondary | ICD-10-CM

## 2022-01-07 DIAGNOSIS — O99891 Other specified diseases and conditions complicating pregnancy: Secondary | ICD-10-CM | POA: Diagnosis not present

## 2022-01-07 DIAGNOSIS — O4703 False labor before 37 completed weeks of gestation, third trimester: Principal | ICD-10-CM | POA: Insufficient documentation

## 2022-01-07 DIAGNOSIS — O98419 Viral hepatitis complicating pregnancy, unspecified trimester: Secondary | ICD-10-CM

## 2022-01-07 DIAGNOSIS — O26843 Uterine size-date discrepancy, third trimester: Secondary | ICD-10-CM

## 2022-01-07 DIAGNOSIS — O99323 Drug use complicating pregnancy, third trimester: Secondary | ICD-10-CM

## 2022-01-07 DIAGNOSIS — F1410 Cocaine abuse, uncomplicated: Secondary | ICD-10-CM

## 2022-01-07 LAB — URINALYSIS, ROUTINE W REFLEX MICROSCOPIC
Bilirubin Urine: NEGATIVE
Glucose, UA: NEGATIVE mg/dL
Hgb urine dipstick: NEGATIVE
Ketones, ur: NEGATIVE mg/dL
Leukocytes,Ua: NEGATIVE
Nitrite: NEGATIVE
Protein, ur: NEGATIVE mg/dL
Specific Gravity, Urine: 1.012 (ref 1.005–1.030)
pH: 7 (ref 5.0–8.0)

## 2022-01-07 LAB — WET PREP, GENITAL
Clue Cells Wet Prep HPF POC: NONE SEEN
Sperm: NONE SEEN
Trich, Wet Prep: NONE SEEN
WBC, Wet Prep HPF POC: <10 (ref <10)
Yeast Wet Prep HPF POC: NONE SEEN

## 2022-01-07 LAB — RUPTURE OF MEMBRANE (ROM)PLUS: Rom Plus: NEGATIVE

## 2022-01-07 MED ORDER — BUTALBITAL-APAP-CAFFEINE 50-325-40 MG PO TABS
2.0000 | ORAL_TABLET | Freq: Once | ORAL | Status: AC
  Administered 2022-01-07: 2 via ORAL
  Filled 2022-01-07: qty 2

## 2022-01-07 MED ORDER — ACETAMINOPHEN 325 MG PO TABS
650.0000 mg | ORAL_TABLET | Freq: Four times a day (QID) | ORAL | Status: DC | PRN
  Administered 2022-01-07: 650 mg via ORAL
  Filled 2022-01-07: qty 2

## 2022-01-07 MED ORDER — TRAMADOL HCL 50 MG PO TABS
100.0000 mg | ORAL_TABLET | Freq: Once | ORAL | Status: AC
  Administered 2022-01-07: 100 mg via ORAL
  Filled 2022-01-07 (×2): qty 2

## 2022-01-07 MED ORDER — LACTATED RINGERS IV BOLUS
1000.0000 mL | Freq: Once | INTRAVENOUS | Status: AC
  Administered 2022-01-07: 1000 mL via INTRAVENOUS

## 2022-01-07 NOTE — Discharge Instructions (Signed)
SCHEDULED C/S April 19th, 2023
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
## 2022-01-07 NOTE — OB Triage Note (Signed)
Pt discharged home per order.   Pt stable and ambulatory and an After Visit Summary was printed and given to the patient. Discharge education completed with patient including follow up instructions, appointments, and medication list. Pt received labor and bleeding precautions. Patient able to verbalize understanding, all questions fully answered upon discharge. Patient instructed to return to ED, call 911, or call MD for any changes in condition.  Case Manager notified of patient's discharge.

## 2022-01-07 NOTE — Progress Notes (Signed)
Pt transferred to [place] with transport.

## 2022-01-07 NOTE — Progress Notes (Addendum)
RN noted area of redness/sores on patient's right hand. Patient stated this has gotten worse over the past few days. Afebrile.  Pt also states headache unrelieved by Tylenol. Pt still feeling mild contractions. PO hydrating.

## 2022-01-07 NOTE — Progress Notes (Signed)
Pt sitting in bed-RN adjusted US-periods of audibly tracing maternal HR.

## 2022-01-07 NOTE — Progress Notes (Addendum)
Pt presented to L/D triage with suspected SROM this morning. Pt reports clear/white fluid. No fluid noted upon vision assessment by RN. Pt reports occasional mild contractions. Pt reports headache since this morning, rated 7/10. Pt reports no bleeding and positive fetal movement. She reports no recent intercourse or urinary discomfort but intermittent vaginal pressure.  Pt reports "feeling dehydrated". PO hydration given.  Monitors applied and assessing, VSS. MD aware of pt arrival.

## 2022-01-09 ENCOUNTER — Other Ambulatory Visit (INDEPENDENT_AMBULATORY_CARE_PROVIDER_SITE_OTHER): Payer: Self-pay | Admitting: Obstetrics and Gynecology

## 2022-01-09 ENCOUNTER — Encounter (INDEPENDENT_AMBULATORY_CARE_PROVIDER_SITE_OTHER): Payer: Self-pay

## 2022-01-11 DIAGNOSIS — O26843 Uterine size-date discrepancy, third trimester: Secondary | ICD-10-CM

## 2022-01-16 ENCOUNTER — Telehealth: Payer: Self-pay

## 2022-01-20 ENCOUNTER — Encounter: Payer: Self-pay | Admitting: Family Medicine

## 2022-01-20 NOTE — Progress Notes (Unsigned)
Patient ID: Lori Pollard, female   DOB: 12-Nov-1990, 31 y.o.   MRN: [id number] ?[name] ? ?Maternity Care Conference Date: 01/20/22 ? ?[name] was identified by clinical staff to benefit from an interdisciplinary team approach to help improve pregnancy care.  The [place] includes the maternity clinic coordinator (RN), medical providers (MD/APP staff), Care Management -OBCM and [facility], Centering Pregnancy coordinator, Infant Mortality reduction Geophysical data processor.  Nursing staff are also encouraged to participate. The group meets monthly to discuss patient care and coordinate services.  ? ?The patient's care care at the agency was reviewed in EMR and high risk factors evaluated in an interdisciplinary approach.   ? ?Value added interventions discussed at this care conference today were: ? ? ? ?[facility] Meeting 3/9 ?Patient is currently incarcerated ?Patient at the moment still has not established care for her pregnancy ?Patient is working with case worker Rashida T  ?[name] will communicate with patient to clarify where her care will be established  ?[name]   ? ? ?

## 2022-01-22 ENCOUNTER — Encounter: Payer: Medicaid Other | Admitting: Obstetrics and Gynecology

## 2022-01-22 ENCOUNTER — Other Ambulatory Visit: Payer: Medicaid Other

## 2022-01-22 DIAGNOSIS — Z3483 Encounter for supervision of other normal pregnancy, third trimester: Secondary | ICD-10-CM

## 2022-01-22 DIAGNOSIS — Z3A34 34 weeks gestation of pregnancy: Secondary | ICD-10-CM

## 2022-01-22 DIAGNOSIS — Z131 Encounter for screening for diabetes mellitus: Secondary | ICD-10-CM

## 2022-01-22 DIAGNOSIS — Z113 Encounter for screening for infections with a predominantly sexual mode of transmission: Secondary | ICD-10-CM

## 2022-02-12 ENCOUNTER — Telehealth: Payer: Self-pay | Admitting: Obstetrics and Gynecology

## 2022-02-12 NOTE — Telephone Encounter (Signed)
In attempt to contact pt- spoke with pt's mother who made [place] aware that pt is currently incarcerated at [facility] and is expected to have c-section with [facility]. I made provider, L&D and OR aware to cancel sx scheduled for 4-19.  ?

## 2022-02-19 ENCOUNTER — Inpatient Hospital Stay: Admission: RE | Admit: 2022-02-19 | Payer: Medicaid Other | Source: Ambulatory Visit

## 2022-02-24 ENCOUNTER — Other Ambulatory Visit: Payer: Medicaid Other

## 2022-02-26 ENCOUNTER — Encounter: Admission: EM | Disposition: A | Discharge status: Home / Self Care | Payer: Self-pay | Source: Home / Self Care

## 2022-02-26 SURGERY — Surgical Case
Anesthesia: Spinal | Laterality: Bilateral

## 2022-02-28 ENCOUNTER — Inpatient Hospital Stay (HOSPITAL_COMMUNITY): Admit: 2022-02-28 | Payer: Self-pay

## 2022-03-18 ENCOUNTER — Emergency Department: Admitting: Anesthesiology

## 2022-03-18 ENCOUNTER — Encounter
Admission: EM | Disposition: A | Discharge status: Home / Self Care | Payer: Self-pay | Source: Home / Self Care | Attending: Emergency Medicine

## 2022-03-18 ENCOUNTER — Emergency Department

## 2022-03-18 ENCOUNTER — Observation Stay
Admission: EM | Admit: 2022-03-18 | Discharge: 2022-03-19 | Disposition: A | Discharge status: Home / Self Care | Attending: Obstetrics and Gynecology | Admitting: Obstetrics and Gynecology

## 2022-03-18 ENCOUNTER — Other Ambulatory Visit: Payer: Self-pay

## 2022-03-18 DIAGNOSIS — R578 Other shock: Secondary | ICD-10-CM

## 2022-03-18 DIAGNOSIS — F17210 Nicotine dependence, cigarettes, uncomplicated: Secondary | ICD-10-CM | POA: Diagnosis not present

## 2022-03-18 DIAGNOSIS — R55 Syncope and collapse: Secondary | ICD-10-CM

## 2022-03-18 DIAGNOSIS — D62 Acute posthemorrhagic anemia: Secondary | ICD-10-CM | POA: Diagnosis not present

## 2022-03-18 HISTORY — PX: DILATION AND EVACUATION: SHX1459

## 2022-03-18 LAB — HEMOGLOBIN AND HEMATOCRIT, BLOOD
HCT: 20 % — ABNORMAL LOW (ref 36.0–46.0)
HCT: 31.5 % — ABNORMAL LOW (ref 36.0–46.0)
Hemoglobin: 6.7 g/dL — ABNORMAL LOW (ref 12.0–15.0)
Hemoglobin: 11 g/dL — ABNORMAL LOW (ref 12.0–15.0)

## 2022-03-18 LAB — BASIC METABOLIC PANEL
Anion gap: 5 (ref 5–15)
BUN: 14 mg/dL (ref 6–20)
CO2: 23 mmol/L (ref 22–32)
Calcium: 8.3 mg/dL — ABNORMAL LOW (ref 8.9–10.3)
Chloride: 112 mmol/L — ABNORMAL HIGH (ref 98–111)
Creatinine, Ser: 0.7 mg/dL (ref 0.44–1.00)
GFR, Estimated: >60 mL/min (ref >60)
Glucose, Bld: 95 mg/dL (ref 70–99)
Potassium: 3.3 mmol/L — ABNORMAL LOW (ref 3.5–5.1)
Sodium: 140 mmol/L (ref 135–145)

## 2022-03-18 LAB — CBC WITH DIFFERENTIAL/PLATELET
Abs Immature Granulocytes: 0.02 10*3/uL (ref 0.00–0.07)
Basophils Absolute: 0 10*3/uL (ref 0.0–0.1)
Basophils Relative: 0 %
Eosinophils Absolute: 0.2 10*3/uL (ref 0.0–0.5)
Eosinophils Relative: 3 %
HCT: 23.6 % — ABNORMAL LOW (ref 36.0–46.0)
Hemoglobin: 7.4 g/dL — ABNORMAL LOW (ref 12.0–15.0)
Immature Granulocytes: 0 %
Lymphocytes Relative: 41 %
Lymphs Abs: 2.2 10*3/uL (ref 0.7–4.0)
MCH: 30.3 pg (ref 26.0–34.0)
MCHC: 31.4 g/dL (ref 30.0–36.0)
MCV: 96.7 fL (ref 80.0–100.0)
Monocytes Absolute: 0.3 10*3/uL (ref 0.1–1.0)
Monocytes Relative: 6 %
Neutro Abs: 2.6 10*3/uL (ref 1.7–7.7)
Neutrophils Relative %: 50 %
Platelets: 334 10*3/uL (ref 150–400)
RBC: 2.44 MIL/uL — ABNORMAL LOW (ref 3.87–5.11)
RDW: 20.6 % — ABNORMAL HIGH (ref 11.5–15.5)
WBC: 5.3 10*3/uL (ref 4.0–10.5)
nRBC: 0 % (ref 0.0–0.2)

## 2022-03-18 LAB — PREPARE RBC (CROSSMATCH)

## 2022-03-18 LAB — BETA-HYDROXYBUTYRIC ACID: Beta-Hydroxybutyric Acid: 0.11 mmol/L (ref 0.05–0.27)

## 2022-03-18 LAB — PROTIME-INR
INR: 1 (ref 0.8–1.2)
Prothrombin Time: 13.1 seconds (ref 11.4–15.2)

## 2022-03-18 LAB — HCG, QUANTITATIVE, PREGNANCY: hCG, Beta Chain, Quant, S: 2 m[IU]/mL (ref <5)

## 2022-03-18 SURGERY — DILATION AND EVACUATION, UTERUS
Anesthesia: General | Site: Vagina

## 2022-03-18 MED ORDER — PHENYLEPHRINE HCL-NACL 20-0.9 MG/250ML-% IV SOLN
INTRAVENOUS | Status: DC | PRN
  Administered 2022-03-18: 30 ug/min via INTRAVENOUS

## 2022-03-18 MED ORDER — TRANEXAMIC ACID-NACL 1000-0.7 MG/100ML-% IV SOLN
1000.0000 mg | INTRAVENOUS | Status: DC
  Filled 2022-03-18 (×2): qty 100

## 2022-03-18 MED ORDER — KETOROLAC TROMETHAMINE 30 MG/ML IJ SOLN
30.0000 mg | Freq: Four times a day (QID) | INTRAMUSCULAR | Status: AC
  Administered 2022-03-18 – 2022-03-19 (×4): 30 mg via INTRAVENOUS
  Filled 2022-03-18 (×4): qty 1

## 2022-03-18 MED ORDER — OXYCODONE HCL 5 MG PO TABS
ORAL_TABLET | ORAL | Status: AC
  Administered 2022-03-18: 5 mg via ORAL
  Filled 2022-03-18: qty 1

## 2022-03-18 MED ORDER — ONDANSETRON HCL 4 MG/2ML IJ SOLN: 4.0000 mg | Freq: Once | INTRAMUSCULAR | Status: DC | PRN

## 2022-03-18 MED ORDER — LACTATED RINGERS IV SOLN: INTRAVENOUS | Status: DC

## 2022-03-18 MED ORDER — ONDANSETRON HCL 4 MG/2ML IJ SOLN
INTRAMUSCULAR | Status: DC | PRN
Start: 2022-03-18 — End: 2022-03-18
  Administered 2022-03-18: 4 mg via INTRAVENOUS

## 2022-03-18 MED ORDER — PHENYLEPHRINE HCL (PRESSORS) 10 MG/ML IV SOLN
INTRAVENOUS | Status: AC
  Filled 2022-03-18: qty 1

## 2022-03-18 MED ORDER — FENTANYL CITRATE (PF) 100 MCG/2ML IJ SOLN
INTRAMUSCULAR | Status: AC
  Administered 2022-03-18: 50 ug via INTRAVENOUS
  Filled 2022-03-18: qty 2

## 2022-03-18 MED ORDER — ACETAMINOPHEN 500 MG PO TABS
1000.0000 mg | ORAL_TABLET | Freq: Four times a day (QID) | ORAL | Status: DC | PRN
  Administered 2022-03-18 – 2022-03-19 (×3): 1000 mg via ORAL
  Filled 2022-03-18 (×3): qty 2

## 2022-03-18 MED ORDER — FLUOXETINE HCL 10 MG PO CAPS
30.0000 mg | ORAL_CAPSULE | Freq: Every day | ORAL | Status: DC
  Administered 2022-03-18: 30 mg via ORAL
  Filled 2022-03-18: qty 3

## 2022-03-18 MED ORDER — SODIUM CHLORIDE FLUSH 0.9 % IV SOLN
INTRAVENOUS | Status: AC
  Filled 2022-03-18: qty 3

## 2022-03-18 MED ORDER — OXYCODONE HCL 5 MG/5ML PO SOLN: 5.0000 mg | Freq: Once | ORAL | Status: AC | PRN

## 2022-03-18 MED ORDER — DEXMEDETOMIDINE (PRECEDEX) IN NS 20 MCG/5ML (4 MCG/ML) IV SYRINGE
PREFILLED_SYRINGE | INTRAVENOUS | Status: DC | PRN
  Administered 2022-03-18: 20 ug via INTRAVENOUS

## 2022-03-18 MED ORDER — EPHEDRINE SULFATE (PRESSORS) 50 MG/ML IJ SOLN
INTRAMUSCULAR | Status: DC | PRN
  Administered 2022-03-18: 10 mg via INTRAVENOUS

## 2022-03-18 MED ORDER — NORETHINDRONE ACETATE 5 MG PO TABS
10.0000 mg | ORAL_TABLET | Freq: Once | ORAL | Status: AC
  Administered 2022-03-18: 10 mg via ORAL
  Filled 2022-03-18: qty 2

## 2022-03-18 MED ORDER — SODIUM CHLORIDE 0.9 % IV SOLN
10.0000 mL/h | Freq: Once | INTRAVENOUS | Status: AC
  Administered 2022-03-18: 10 mL/h via INTRAVENOUS

## 2022-03-18 MED ORDER — SUCCINYLCHOLINE CHLORIDE 200 MG/10ML IV SOSY
PREFILLED_SYRINGE | INTRAVENOUS | Status: DC | PRN
  Administered 2022-03-18: 80 mg via INTRAVENOUS

## 2022-03-18 MED ORDER — ACETAMINOPHEN 10 MG/ML IV SOLN
INTRAVENOUS | Status: AC
  Filled 2022-03-18: qty 100

## 2022-03-18 MED ORDER — IBUPROFEN 600 MG PO TABS
600.0000 mg | ORAL_TABLET | Freq: Four times a day (QID) | ORAL | Status: DC
  Administered 2022-03-19: 600 mg via ORAL
  Filled 2022-03-18: qty 1

## 2022-03-18 MED ORDER — PHENYLEPHRINE 80 MCG/ML (10ML) SYRINGE FOR IV PUSH (FOR BLOOD PRESSURE SUPPORT)
PREFILLED_SYRINGE | INTRAVENOUS | Status: DC | PRN
  Administered 2022-03-18: 160 ug via INTRAVENOUS

## 2022-03-18 MED ORDER — 0.9 % SODIUM CHLORIDE (POUR BTL) OPTIME
TOPICAL | Status: DC | PRN
  Administered 2022-03-18: 500 mL

## 2022-03-18 MED ORDER — PRENATAL MULTIVITAMIN CH
1.0000 | ORAL_TABLET | Freq: Every day | ORAL | Status: DC
  Administered 2022-03-18 – 2022-03-19 (×2): 1 via ORAL
  Filled 2022-03-18 (×2): qty 1

## 2022-03-18 MED ORDER — ALBUMIN HUMAN 5 % IV SOLN: INTRAVENOUS | Status: DC | PRN

## 2022-03-18 MED ORDER — FENTANYL CITRATE (PF) 100 MCG/2ML IJ SOLN
INTRAMUSCULAR | Status: DC | PRN
Start: 2022-03-18 — End: 2022-03-18
  Administered 2022-03-18: 25 ug via INTRAVENOUS
  Administered 2022-03-18: 50 ug via INTRAVENOUS
  Administered 2022-03-18: 25 ug via INTRAVENOUS

## 2022-03-18 MED ORDER — DEXAMETHASONE SODIUM PHOSPHATE 10 MG/ML IJ SOLN
INTRAMUSCULAR | Status: DC | PRN
  Administered 2022-03-18: 10 mg via INTRAVENOUS

## 2022-03-18 MED ORDER — OXYCODONE HCL 5 MG PO TABS: 5.0000 mg | ORAL_TABLET | Freq: Once | ORAL | Status: AC | PRN

## 2022-03-18 MED ORDER — FENTANYL CITRATE (PF) 100 MCG/2ML IJ SOLN: 25.0000 ug | INTRAMUSCULAR | Status: DC | PRN

## 2022-03-18 MED ORDER — SIMETHICONE 80 MG PO CHEW: 80.0000 mg | CHEWABLE_TABLET | Freq: Four times a day (QID) | ORAL | Status: DC | PRN

## 2022-03-18 MED ORDER — ACETAMINOPHEN 10 MG/ML IV SOLN
1000.0000 mg | Freq: Once | INTRAVENOUS | Status: DC | PRN
  Administered 2022-03-18: 1000 mg via INTRAVENOUS

## 2022-03-18 MED ORDER — GABAPENTIN 100 MG PO CAPS
100.0000 mg | ORAL_CAPSULE | Freq: Four times a day (QID) | ORAL | Status: DC
  Administered 2022-03-18 – 2022-03-19 (×4): 100 mg via ORAL
  Filled 2022-03-18 (×4): qty 1

## 2022-03-18 MED ORDER — BUSPIRONE HCL 15 MG PO TABS
15.0000 mg | ORAL_TABLET | Freq: Two times a day (BID) | ORAL | Status: DC
  Administered 2022-03-18 – 2022-03-19 (×2): 15 mg via ORAL
  Filled 2022-03-18 (×2): qty 1

## 2022-03-18 MED ORDER — ONDANSETRON HCL 4 MG/2ML IJ SOLN
4.0000 mg | Freq: Once | INTRAMUSCULAR | Status: AC
  Administered 2022-03-18: 4 mg via INTRAVENOUS
  Filled 2022-03-18: qty 2

## 2022-03-18 MED ORDER — FENTANYL CITRATE (PF) 100 MCG/2ML IJ SOLN
INTRAMUSCULAR | Status: AC
  Filled 2022-03-18: qty 2

## 2022-03-18 MED ORDER — PROPOFOL 10 MG/ML IV BOLUS
INTRAVENOUS | Status: DC | PRN
  Administered 2022-03-18: 130 mg via INTRAVENOUS

## 2022-03-18 MED ORDER — LACTATED RINGERS IV BOLUS
1000.0000 mL | Freq: Once | INTRAVENOUS | Status: AC
  Administered 2022-03-18: 1000 mL via INTRAVENOUS

## 2022-03-18 MED ORDER — OXYTOCIN 10 UNIT/ML IJ SOLN
INTRAMUSCULAR | Status: AC
  Filled 2022-03-18: qty 2

## 2022-03-18 MED ORDER — LIDOCAINE HCL (CARDIAC) PF 100 MG/5ML IV SOSY
PREFILLED_SYRINGE | INTRAVENOUS | Status: DC | PRN
  Administered 2022-03-18: 80 mg via INTRAVENOUS

## 2022-03-18 MED ORDER — SODIUM CHLORIDE 0.9 % IV SOLN: 10.0000 mL/h | Freq: Once | INTRAVENOUS | Status: DC

## 2022-03-18 MED ORDER — KETOROLAC TROMETHAMINE 15 MG/ML IJ SOLN
15.0000 mg | Freq: Once | INTRAMUSCULAR | Status: AC
  Administered 2022-03-18: 15 mg via INTRAVENOUS
  Filled 2022-03-18: qty 1

## 2022-03-18 MED ORDER — PROPOFOL 10 MG/ML IV BOLUS
INTRAVENOUS | Status: AC
  Filled 2022-03-18: qty 20

## 2022-03-18 MED ORDER — TRANEXAMIC ACID-NACL 1000-0.7 MG/100ML-% IV SOLN: 1000.0000 mg | INTRAVENOUS | Status: AC

## 2022-03-18 MED ORDER — ALBUMIN HUMAN 5 % IV SOLN
INTRAVENOUS | Status: AC
  Filled 2022-03-18: qty 500

## 2022-03-18 MED ORDER — LACTATED RINGERS IV SOLN: INTRAVENOUS | Status: DC | PRN

## 2022-03-18 MED ORDER — TRANEXAMIC ACID-NACL 1000-0.7 MG/100ML-% IV SOLN
INTRAVENOUS | Status: AC
Start: 2022-03-18 — End: 2022-03-18
  Administered 2022-03-18: 1000 mg via INTRAVENOUS
  Filled 2022-03-18: qty 100

## 2022-03-18 MED ORDER — FENTANYL CITRATE (PF) 100 MCG/2ML IJ SOLN
25.0000 ug | INTRAMUSCULAR | Status: DC | PRN
  Administered 2022-03-18: 50 ug via INTRAVENOUS

## 2022-03-18 MED ORDER — OXYTOCIN 10 UNIT/ML IJ SOLN
INTRAMUSCULAR | Status: DC | PRN
  Administered 2022-03-18: 40 [IU] via INTRAMUSCULAR

## 2022-03-18 MED ORDER — DEXTROSE IN LACTATED RINGERS 5 % IV SOLN: INTRAVENOUS | Status: DC

## 2022-03-18 SURGICAL SUPPLY — 32 items
ADAPTER VACURETTE TBG SET 14 (CANNULA) IMPLANT
BACTOSHIELD CHG 4% 4OZ (MISCELLANEOUS) ×1
CUP MEDICINE 2OZ PLAST GRAD ST (MISCELLANEOUS) ×2 IMPLANT
DEVICE JADA (OBSTETRICS) ×1 IMPLANT
DRSG TELFA 3X8 NADH (GAUZE/BANDAGES/DRESSINGS) ×2 IMPLANT
FILTER UTR ASPR SPEC (MISCELLANEOUS) ×1 IMPLANT
FLTR UTR ASPR SPEC (MISCELLANEOUS) ×2 IMPLANT
GLOVE BIOGEL PI ORTHO PRO 7.5 (GLOVE) ×1
GLOVE PI ORTHO PRO STRL 7.5 (GLOVE) ×1 IMPLANT
GOWN STRL REUS W/ TWL LRG LVL3 (GOWN DISPOSABLE) ×2 IMPLANT
GOWN STRL REUS W/TWL LRG LVL3 (GOWN DISPOSABLE) ×2
GRADUATE 1200CC STRL 31836 (MISCELLANEOUS) ×2 IMPLANT
JADA ×1 IMPLANT
KIT BERKELEY 1ST TRIMESTER 3/8 (MISCELLANEOUS) ×2 IMPLANT
KIT TURNOVER KIT A (KITS) ×2 IMPLANT
MANIFOLD NEPTUNE II (INSTRUMENTS) ×2 IMPLANT
NDL HYPO 25X1 1.5 SAFETY (NEEDLE) ×1 IMPLANT
NEEDLE HYPO 25X1 1.5 SAFETY (NEEDLE) IMPLANT
PACK DNC HYST (MISCELLANEOUS) ×2 IMPLANT
PAD DRESSING TELFA 3X8 NADH (GAUZE/BANDAGES/DRESSINGS) ×1 IMPLANT
PAD OB MATERNITY 4.3X12.25 (PERSONAL CARE ITEMS) ×2 IMPLANT
PAD PREP 24X41 OB/GYN DISP (PERSONAL CARE ITEMS) ×2 IMPLANT
SCRUB CHG 4% DYNA-HEX 4OZ (MISCELLANEOUS) ×1 IMPLANT
SET BERKELEY SUCTION TUBING (SUCTIONS) ×2 IMPLANT
SET CYSTO W/LG BORE CLAMP LF (SET/KITS/TRAYS/PACK) IMPLANT
SOL PREP PVP 2OZ (MISCELLANEOUS) ×2 IMPLANT
SOLUTION PREP PVP 2OZ (MISCELLANEOUS) ×1 IMPLANT
VACURETTE 10 RIGID CVD (CANNULA) ×1 IMPLANT
VACURETTE 12 RIGID CVD (CANNULA) IMPLANT
VACURETTE 7MM F TIP (CANNULA)
VACURETTE 7MM F TIP STRL (CANNULA) IMPLANT
VACURETTE 8 RIGID CVD (CANNULA) IMPLANT

## 2022-03-18 NOTE — Anesthesia Procedure Notes (Signed)
Procedure Name: Intubation ?Date/Time: 03/18/2022 9:02 AM ?Performed by: Doreen Salvage, CRNA ?Pre-anesthesia Checklist: Patient identified, Patient being monitored, Timeout performed, Emergency Drugs available and Suction available ?Patient Re-evaluated:Patient Re-evaluated prior to induction ?Oxygen Delivery Method: Circle system utilized ?Preoxygenation: Pre-oxygenation with 100% oxygen ?Induction Type: IV induction, Cricoid Pressure applied and Rapid sequence ?Laryngoscope Size: 3 and Glidescope ?Grade View: Grade I ?Tube type: Oral ?Tube size: 6.5 mm ?Number of attempts: 1 ?Airway Equipment and Method: Stylet and Video-laryngoscopy ?Placement Confirmation: ETT inserted through vocal cords under direct vision, positive ETCO2 and breath sounds checked- equal and bilateral ?Secured at: 21 cm ?Tube secured with: Tape ?Dental Injury: Teeth and Oropharynx as per pre-operative assessment  ? ? ? ? ?

## 2022-03-18 NOTE — ED Notes (Signed)
Pharmacy called to send txa now to ED to start ASAP per Roxan Hockey, MD. ?

## 2022-03-18 NOTE — Op Note (Addendum)
? ? ?  OPERATIVE NOTE ?03/18/2022 ?9:59 AM ? ?PRE-OPERATIVE DIAGNOSIS:  ?1) vaginal bleeding ?2) acute blood loss anemia ?3) Delayed post-partum hemorrhage ? ? ?POST-OPERATIVE DIAGNOSIS:  ?Same ?2) Possible retainted POC ? ?OPERATION:  D&E - Placement of JADA ? ?SURGEON(S): Surgeon(s) and Role: ?   Harlin Heys, MD - Primary  ? ?ANESTHESIA: Choice ? ?ESTIMATED BLOOD LOSS: 29mL ? ?OPERATIVE FINDINGS: Possible POC ? ?SPECIMEN:  ?ID Type Source Tests Collected by Time Destination  ?1 : UTERINE CONTENTS Tissue PATH Other SURGICAL PATHOLOGY Harlin Heys, MD 03/18/2022 [id number]   ? ? ?COMPLICATIONS: None ? ?DRAINS: Foley to gravity ? ?DISPOSITION: Stable to recovery room ? ?DESCRIPTION OF PROCEDURE: ?     The patient was prepped and draped in the dorsal lithotomy position and placed under general anesthesia. Her cervix was grasped with a Jacob's tenaculum. Respecting the position and curvature of her cervix, it was dilated to accommodate a number 10 suction curette. The suction curette was placed within the endometrial cavity and a pressure greater than 65 mmHg was allowed to build. A systematic curettage was performed in all quadrants until no additional tissue was noted. The uterus became firm and globular. Pitocin was run in the IV.  ?Continued bleeding was noted from the cervical os after @ 5 minutes of observation.  A JADA system was placed. ?The tenaculum was removed from the cervix and hemostasis was noted. The weighted speculum was removed and the patient went to recovery room in stable condition. ? ?No follow-up provider specified. ? ?Finis Bud, M.D. ?03/18/2022 ?9:59 AM ? ?

## 2022-03-18 NOTE — ED Notes (Signed)
Report given to surgery at this time

## 2022-03-18 NOTE — Progress Notes (Signed)
Patient stated she needed to void.  Patient states she felt the urge but unable to void.  RN notified Dr. Logan Bores, verbal order for In and out cath.  In and out cath performed by RN.  drained from urine.  Dr. Logan Bores aware. ?

## 2022-03-18 NOTE — Anesthesia Preprocedure Evaluation (Addendum)
Anesthesia Evaluation  ?Patient identified by MRN, date of birth, ID band ?Patient awake ? ? ? ?Reviewed: ?Allergy & Precautions, NPO status , Patient's Chart, lab work & pertinent test results ? ?History of Anesthesia Complications ?Negative for: history of anesthetic complications ? ?Airway ?Mallampati: I ? ? ?Neck ROM: Full ? ? ? Dental ?no notable dental hx. ? ?  ?Pulmonary ?Current Smoker,  ?  ?Pulmonary exam normal ?breath sounds clear to auscultation ? ? ? ? ? ? Cardiovascular ?Exercise Tolerance: Good ?negative cardio ROS ?Normal cardiovascular exam ?Rhythm:Regular Rate:Normal ? ?ECG 03/18/22:  ?Sinus rhythm ?Low voltage, precordial leads ?  ?Neuro/Psych ?Hx polysubstance use disorder; last cocaine use 01/10/22 ?  ? GI/Hepatic ?negative GI ROS, (+) Hepatitis -, C  ?Endo/Other  ?negative endocrine ROS ? Renal/GU ?negative Renal ROS  ? ?  ?Musculoskeletal ? ? Abdominal ?  ?Peds ? Hematology ? ?(+) Blood dyscrasia, anemia , Postpartum hemorrhage s/p c-section 02/21/22   ?Anesthesia Other Findings ? ? Reproductive/Obstetrics ? ?  ? ? ? ? ? ? ? ? ? ? ? ? ? ?  ?  ? ? ? ? ? ? ? ?Anesthesia Physical ?Anesthesia Plan ? ?ASA: 3 and emergent ? ?Anesthesia Plan: General  ? ?Post-op Pain Management:   ? ?Induction: Intravenous and Rapid sequence ? ?PONV Risk Score and Plan: 2 and Ondansetron, Dexamethasone and Treatment may vary due to age or medical condition ? ?Airway Management Planned: Oral ETT ? ?Additional Equipment:  ? ?Intra-op Plan:  ? ?Post-operative Plan: Extubation in OR ? ?Informed Consent: I have reviewed the patients History and Physical, chart, labs and discussed the procedure including the risks, benefits and alternatives for the proposed anesthesia with the patient or authorized representative who has indicated his/her understanding and acceptance.  ? ? ? ?Dental advisory given ? ?Plan Discussed with: CRNA ? ?Anesthesia Plan Comments: (Patient consented for risks of  anesthesia including but not limited to:  ?- adverse reactions to medications ?- damage to eyes, teeth, lips or other oral mucosa ?- nerve damage due to positioning  ?- sore throat or hoarseness ?- damage to heart, brain, nerves, lungs, other parts of body or loss of life ? ?Informed patient about role of CRNA in peri- and intra-operative care.  Patient voiced understanding.)  ? ? ? ? ? ? ?Anesthesia Quick Evaluation ? ?

## 2022-03-18 NOTE — ED Notes (Signed)
MD in room now .  ?

## 2022-03-18 NOTE — ED Notes (Signed)
Pt brief changed at this time. Pt passed a large blood clot with a soaked brief.  ?

## 2022-03-18 NOTE — ED Notes (Signed)
Per MD verbal order, blood transfusion running at a faster rate. ?

## 2022-03-18 NOTE — ED Notes (Addendum)
Blood rate for 2nd transfusion rate changed to 999 mL/hr per Don Perking, MD. ?

## 2022-03-18 NOTE — ED Notes (Signed)
Upon coming back from this writer getting blood for this patient this Probation officer entered the room to find the patient on the floor with the ultrasound tech, the police officer, and Leisure centre manager , Risk analyst and the MD. Per ultrasound the patient got up to use the rest room with out asking for assistance prior , per ultrasound the patient was stable walking to the restroom, however after sitting on the toilet the patient became dizzy and passed out and hit head first after falling on the floor. This Probation officer helped patient with the witnesses get back into bed. Blood was initiated per verbal MD order as well as fluids .  ?

## 2022-03-18 NOTE — ED Triage Notes (Signed)
pt coming from jail, pt had c section on 4/14, pt has been passing clots since the section and performed a DNE , pt c/o abdominal pain. VSS per ems. per patient she has soaked 11 pads today. G6P6L6. Pt did receive iron and blood transfusion post partum ?

## 2022-03-18 NOTE — Anesthesia Postprocedure Evaluation (Signed)
Anesthesia Post Note ? ?Patient: Lori Pollard ? ?Procedure(s) Performed: DILATATION AND EVACUATION WITH JADA PLACEMENT (Vagina ) ? ?Patient location during evaluation: PACU ?Anesthesia Type: General ?Level of consciousness: awake and alert, oriented and patient cooperative ?Pain management: pain level controlled ?Vital Signs Assessment: post-procedure vital signs reviewed and stable ?Respiratory status: spontaneous breathing, nonlabored ventilation and respiratory function stable ?Cardiovascular status: blood pressure returned to baseline and stable ?Postop Assessment: adequate PO intake ?Anesthetic complications: no ?Comments: Receiving blood transfusion in PACU. ? ? ?No notable events documented. ? ? ?Last Vitals:  ?Vitals:  ? 03/18/22 1030 03/18/22 1045  ?BP: 92/73 (!) 92/56  ?Pulse: 75 74  ?Resp: 12 13  ?Temp: 36.4 ?C   ?SpO2: 100% 100%  ?  ?Last Pain:  ?Vitals:  ? 03/18/22 1030  ?TempSrc:   ?PainSc: 8   ? ? ?  ?  ?  ?  ?  ?  ? ?Reed Breech ? ? ? ? ?

## 2022-03-18 NOTE — ED Notes (Signed)
Per the incident the patient has the following in place gripping socks in, fall alarm in place , and a yellow fall wrist bracelet on .  ?

## 2022-03-18 NOTE — H&P (Signed)
? ? ?    PRE-OPERATIVE HISTORY AND PHYSICAL EXAM ? ?PCP[facility]  [facility] ?Subjective:  ? ?HPI:  Lori ParrChristina [name] is a 31 y.o. [phone].  Patient's last menstrual period was 07/01/2021.  Patient has been incarcerated and she presented to the ED today with complaint of abdominal cramping and very heavy vaginal bleeding.  She was found to be anemic and hypotensive.  She has been given IV fluids and 2 units of packed red cells.  She continues to complain of severe abdominal cramping and heavy vaginal bleeding. ?She is currently incarcerated, has a history of polysubstance abuse and hepatitis C. ?Her significant history includes a cesarean delivery and tubal removal for birth control on 02/21/2022.  Subsequent to that [facility] with delayed [facility] hemorrhage diagnosed.  She had a D&C at that time.  Her bleeding was then managed and she received IV iron infusion. ?Her ultrasound today shows no myometrial abnormalities but a very large clot located in the uterus and endocervix.  No other pelvic abnormalities are seen.  No vascular abnormalities. ?Patient denies any history of bleeding dyscrasia. ?TXA was given in the ED. ? ? ?Review of Systems:  ? ?Constitutional: Denied constitutional symptoms, night sweats, recent illness, fatigue, fever, insomnia and weight loss.  ?Eyes: Denied eye symptoms, eye pain, photophobia, vision change and visual disturbance.  ?Ears/Nose/Throat/Neck: Denied ear, nose, throat or neck symptoms, hearing loss, nasal discharge, sinus congestion and sore throat.  ?Cardiovascular: Denied cardiovascular symptoms, arrhythmia, chest pain/pressure, edema, exercise intolerance, orthopnea and palpitations.  ?Respiratory: Denied pulmonary symptoms, asthma, pleuritic pain, productive sputum, cough, dyspnea and wheezing.  ?Gastrointestinal: Denied, gastro-esophageal reflux, melena, nausea and vomiting.  ?Genitourinary: See HPI for additional information.   ?Musculoskeletal: Denied musculoskeletal symptoms, stiffness, swelling, muscle weakness and myalgia.  ?Dermatologic: Denied dermatology symptoms, rash and scar.  ?Neurologic: Denied neurology symptoms, dizziness, headache, neck pain and syncope.  ?Psychiatric: Denied psychiatric symptoms, anxiety and depression.  ?Endocrine: Denied endocrine symptoms including hot flashes and night sweats.  ? ?OB History  ?Gravida Para Term Preterm AB Living  ?7 3 2 1 3 3   ?SAB IAB Ectopic Multiple Live Births  ?2 1   0 3  ?  ?# Outcome Date GA Lbr Len/2nd Weight Sex Delivery Anes PTL Lv  ?7 Gravida           ?6 Preterm 11/01/19 5228w0d  1530 g F CS-LTranv Spinal  LIV  ?5 SAB 05/2018          ?4 Term 05/26/14 6070w0d  2268 g M CS-Unspec  N LIV  ?3 Term 01/24/12 4370w0d  2722 g M CS-Unspec  N LIV  ?2 IAB 2013          ?1 SAB 2012          ? ? ?Past Medical History:  ?Diagnosis Date  ? Anemia   ? Anxiety   ? Cervicalgia   ? Depression   ? Hepatitis   ? History of chickenpox   ? Hives   ? Mental disorder   ? Polysubstance abuse (HCC)   ? Preterm labor   ? Vaginal Pap smear, abnormal   ? ? ?Past Surgical History:  ?Procedure Laterality Date  ? CESAREAN SECTION  2013  ? CESAREAN SECTION  2015  ? CESAREAN SECTION N/A 11/01/2019  ? Procedure: CESAREAN SECTION;  Surgeon: Natale MilchSchuman, Christanna R, MD;  Location: [facility] ORS;  Service: Obstetrics;  Laterality: N/A;  ? INCISION AND DRAINAGE OF WOUND Right 06/25/2020  ?  Procedure: IRRIGATION AND DEBRIDEMENT WOUND-Right Leg;  Surgeon: Signa Kell, MD;  Location: [facility] ORS;  Service: Orthopedics;  Laterality: Right;  ?   ? ?SOCIAL HISTORY: ? ?Social History  ? ?Tobacco Use  ?Smoking Status Every Day  ? Packs/day: 0.50  ? Years: 11.00  ? Pack years: 5.50  ? Types: Cigarettes  ? Start date: 09/17/2004  ? Passive exposure: Past  ?Smokeless Tobacco Never  ? ?Social History  ? ?Substance and Sexual Activity  ?Alcohol Use No  ? Alcohol/week: 0.0 standard drinks  ? ? ?Social History  ? ?Substance and Sexual Activity   ?Drug Use Not Currently  ? Types: Cocaine, Methamphetamines, Heroin  ? Comment: last cocaine use was 2 days ago (12/15/21), pt currently takes suboxone  ? ? ?Family History  ?Problem Relation Age of Onset  ? Alcohol abuse Mother   ? GER disease Mother   ? Depression Mother   ? Alcohol abuse Father   ? Emphysema Father   ? Cancer Maternal Grandmother   ? Diabetes Maternal Grandmother   ? Heart disease Maternal Grandmother   ? Hyperlipidemia Maternal Grandmother   ? Hypertension Maternal Grandmother   ? Kidney disease Maternal Grandmother   ? Lung cancer Maternal Grandmother   ? ? ?ALLERGIES:  Patient has no known allergies. ? ?MEDS: ?  ?No current facility-administered medications on file prior to encounter.  ? ?Current Outpatient Medications on File Prior to Encounter  ?Medication Sig Dispense Refill  ? amoxicillin-clavulanate (AUGMENTIN) 875-125 MG tablet Take 1 tablet by mouth 2 (two) times daily. (Patient not taking: Reported on 01/07/2022) 20 tablet 1  ? buprenorphine (SUBUTEX) 8 MG SUBL SL tablet Place 2.5 tablets under the tongue daily. (Patient not taking: Reported on 03/18/2022)    ? buprenorphine-naloxone (SUBOXONE) 8-2 mg SUBL SL tablet Place 1 tablet under the tongue daily. (Patient not taking: Reported on 01/07/2022)    ? ferrous sulfate 325 (65 FE) MG tablet Take 325 mg by mouth daily with breakfast. (Patient not taking: Reported on 01/07/2022)    ? furosemide (LASIX) 20 MG tablet Take 1 tablet (20 mg total) by mouth daily. (Patient not taking: Reported on 01/07/2022) 3 tablet 0  ? hydrOXYzine (ATARAX) 10 MG tablet Take 10 mg by mouth 3 (three) times daily as needed. (Patient not taking: Reported on 03/18/2022)    ? Prenatal Vit-Fe Fumarate-FA (MULTIVITAMIN-PRENATAL) 27-0.8 MG TABS tablet Take 1 tablet by mouth daily at 12 noon. (Patient not taking: Reported on 03/18/2022)    ? ? ?Meds ordered this encounter  ?Medications  ? 0.9 %  sodium chloride infusion  ? ketorolac (TORADOL) 15 MG/ML injection 15 mg  ?  lactated ringers bolus 1,000 mL  ? ondansetron (ZOFRAN) injection 4 mg  ? norethindrone (AYGESTIN) tablet 10 mg  ? ondansetron (ZOFRAN) injection 4 mg  ? tranexamic acid (CYKLOKAPRON) IVPB 1,000 mg  ? 0.9 %  sodium chloride infusion  ?  ? ?Physical examination ?BP 116/81   Pulse 60   Temp 97.6 ?F (36.4 ?C) (Oral)   Resp 10   Ht 4\' 11"  (1.499 m)   Wt 49.9 kg   LMP 07/01/2021   SpO2 100%   Breastfeeding Unknown   BMI 22.22 kg/m?  ? ?General NAD, Conversant  ?HEENT Atraumatic; Op clear with mmm.  Normo-cephalic. Pupils reactive. Anicteric sclerae  ?Skin No rashes, lesions or ulceration. Normal palpated skin turgor. No nodularity.  ?Lungs: Clear to auscultation.No rales or wheezes. Normal Respiratory effort, no retractions.  ?Heart: NSR.  No  murmurs or rubs appreciated. No periferal edema  ?Abdomen: Soft.  C/o lower abd pain with palpation  ?Extremities: Moves all appropriately.  Normal ROM for age. No lymphadenopathy.  ?Neuro: Oriented to PPT.  Normal mood. Normal affect.  ? ?Pelvic exam - deferred to OR. ? ?[place]: ?FINDINGS: ?Uterus ?  ?Measurements: 12 x 5 x 7 cm = volume: 220 mL. No myometrial findings ?other than anterior lower uterine segment distortion correlating ?with history of Cesarian section ?  ?Endometrium ?  ?Diffusely heterogeneous and thickened to 27 mm with continuation ?through the cervix into the upper vagina. No visible underlying ?vascular lesion on color Doppler images ?  ?Right ovary ?  ?2.3 cc volume.  Normal appearance/no adnexal mass. ?  ?Left ovary ?  ?1.4 cc volume.  Normal appearance/no adnexal mass. ?  ?IMPRESSION: ?Blood clot appearance distending the endometrial and endocervical ?canals and extending into the upper vagina. No visible vascular ?lesion by color Doppler. ?  ? ?Assessment:  ? ?[id number] ?Patient Active Problem List  ? Diagnosis Date Noted  ? Size of fetus inconsistent with dates in third trimester   ? Incarceration   ? Indication for care in labor and delivery,  antepartum 01/07/2022  ? Indication for care in labor or delivery 01/07/2022  ? Elevated blood pressure affecting pregnancy in third trimester, antepartum 12/17/2021  ? Bilateral lower extremity edema   ? Cellulitis of lower leg   ? Chro

## 2022-03-18 NOTE — ED Notes (Signed)
Blood consent in patients chart ?

## 2022-03-18 NOTE — ED Provider Notes (Addendum)
? ?[facility] ?Provider Note ? ? ? Event Date/Time  ? First MD Initiated Contact with Patient 03/18/22 [id number]   ?  (approximate) ? ? ?History  ? ?Vaginal Bleeding ? ? ?HPI ? ?Lori Pollard is a 31 y.o. female with a history of hepatitis C, polysubstance abuse on remission, anemia who presents for evaluation of vaginal bleeding.  Patient had a C-section on 02/21/2022 at [facility].  That was her 6 pregnancy.  Patient then was taken to [facility] 24 hours later with delayed postpartum hemorrhage.  She underwent a D&C and had a iron infusion.  Since then she has been bleeding but however over the last 24 hours the bleeding has become more severe and she is passing very large clots.  She feels lightheaded like she is going to pass out.  She is complaining of severe suprapubic cramping and radiating straight to her back.  She denies any history of bleeding disorder.  She is not on blood thinners.  She denies fever or chills. ?  ? ? ?Past Medical History:  ?Diagnosis Date  ? Anemia   ? Anxiety   ? Cervicalgia   ? Depression   ? Hepatitis   ? History of chickenpox   ? Hives   ? Mental disorder   ? Polysubstance abuse ([place])   ? Preterm labor   ? Vaginal Pap smear, abnormal   ? ? ?Past Surgical History:  ?Procedure Laterality Date  ? CESAREAN SECTION  2013  ? CESAREAN SECTION  2015  ? CESAREAN SECTION N/A 11/01/2019  ? Procedure: CESAREAN SECTION;  Surgeon: Homero Fellers, MD;  Location: [facility] ORS;  Service: Obstetrics;  Laterality: N/A;  ? INCISION AND DRAINAGE OF WOUND Right 06/25/2020  ? Procedure: IRRIGATION AND DEBRIDEMENT WOUND-Right Leg;  Surgeon: Leim Fabry, MD;  Location: [facility] ORS;  Service: Orthopedics;  Laterality: Right;  ? ? ? ?Physical Exam  ? ?Triage Vital Signs: ?ED Triage Vitals  ?Enc Vitals Group  ?   BP 03/18/22 0319 98/81  ?   Pulse Rate 03/18/22 0319 87  ?   Resp 03/18/22 0319 15  ?   Temp 03/18/22 0319 98.6 ?F (37 ?C)  ?   Temp Source 03/18/22 0319 Oral  ?   SpO2 03/18/22  0319 100 %  ?   Weight 03/18/22 0320 110 lb (49.9 kg)  ?   Height 03/18/22 0320 4\' 11"  (1.499 m)  ?   Head Circumference --   ?   Peak Flow --   ?   Pain Score 03/18/22 0320 8  ?   Pain Loc --   ?   Pain Edu? --   ?   Excl. in [place]? --   ? ? ?Most recent vital signs: ?Vitals:  ? 03/18/22 0750 03/18/22 0815  ?BP: 117/80 127/76  ?Pulse: (!) 57 60  ?Resp: 16 15  ?Temp: 97.7 ?F (36.5 ?C) 97.6 ?F (36.4 ?C)  ?SpO2: 100% 100%  ? ? ? ?Constitutional: Alert and oriented. Well appearing and in no apparent distress. ?HEENT: ?     Head: Normocephalic and atraumatic.    ?     Eyes: Conjunctivae are normal. Sclera is non-icteric.  ?     Mouth/Throat: Mucous membranes are moist.  ?     Neck: Supple with no signs of meningismus. ?Cardiovascular: Regular rate and rhythm. No murmurs, gallops, or rubs. 2+ symmetrical distal pulses are present in all extremities.  ?Respiratory: Normal respiratory effort. Lungs are clear to auscultation bilaterally.  ?  Gastrointestinal: Soft, non tender, and non distended with positive bowel sounds. No rebound or guarding. ?Genitourinary: No CVA tenderness. Large amount of blood and clots from cervix ?Musculoskeletal:  No edema, cyanosis, or erythema of extremities. ?Neurologic: Normal speech and language. Face is symmetric. Moving all extremities. No gross focal neurologic deficits are appreciated. ?Skin: Skin is warm, dry and intact. No rash noted. ?Psychiatric: Mood and affect are normal. Speech and behavior are normal. ? ?ED Results / Procedures / Treatments  ? ?Labs ?(all labs ordered are listed, but only abnormal results are displayed) ?Labs Reviewed  ?CBC WITH DIFFERENTIAL/PLATELET - Abnormal; Notable for the following components:  ?    Result Value  ? RBC 2.44 (*)   ? Hemoglobin 7.4 (*)   ? HCT 23.6 (*)   ? RDW 20.6 (*)   ? All other components within normal limits  ?BASIC METABOLIC PANEL - Abnormal; Notable for the following components:  ? Potassium 3.3 (*)   ? Chloride 112 (*)   ? Calcium 8.3 (*)    ? All other components within normal limits  ?PROTIME-INR  ?BETA-HYDROXYBUTYRIC ACID  ?HCG, QUANTITATIVE, PREGNANCY  ?PREPARE RBC (CROSSMATCH)  ?TYPE AND SCREEN  ? ? ? ?EKG ? ?none ? ? ?RADIOLOGY ?I, Rudene Re, attending MD, have personally viewed and interpreted the images obtained during this visit as below: ? ?Ultrasound showing large blood clot in the uterus ? ?CT head and C-spine negative for traumatic injury ? ? ?___________________________________________________ ?Interpretation by Radiologist:  ?CT Head Wo Contrast ? ?Result Date: 03/18/2022 ?CLINICAL DATA:  Head trauma.  Fell on floor. EXAM: CT HEAD WITHOUT CONTRAST CT CERVICAL SPINE WITHOUT CONTRAST TECHNIQUE: Multidetector CT imaging of the head and cervical spine was performed following the standard protocol without intravenous contrast. Multiplanar CT image reconstructions of the cervical spine were also generated. RADIATION DOSE REDUCTION: This exam was performed according to the departmental dose-optimization program which includes automated exposure control, adjustment of the mA and/or kV according to patient size and/or use of iterative reconstruction technique. COMPARISON:  None Available. FINDINGS: CT HEAD FINDINGS Brain: No evidence of acute infarction, hemorrhage, hydrocephalus, extra-axial collection or mass lesion/mass effect. Vascular: No hyperdense vessel or unexpected calcification. Skull: Normal. Negative for fracture or focal lesion. Sinuses/Orbits: No acute finding. Other: None CT CERVICAL SPINE FINDINGS Alignment: There is reversal of normal cervical lordosis which likely reflects patient positioning. No posttraumatic malalignment. Skull base and vertebrae: No acute fracture. No primary bone lesion or focal pathologic process. Soft tissues and spinal canal: No prevertebral fluid or swelling. No visible canal hematoma. Disc levels: The vertebral body heights and disc spaces are well preserved. No significant spondylosis. Upper  chest: Negative. Other: None IMPRESSION: 1. No acute intracranial abnormalities. 2. No evidence for cervical spine fracture or posttraumatic malalignment. 3. Reversal of normal cervical lordosis which likely reflects patient positioning. Electronically Signed   By: Kerby Moors M.D.   On: 03/18/2022 07:17  ? ?CT Cervical Spine Wo Contrast ? ?Result Date: 03/18/2022 ?CLINICAL DATA:  Head trauma.  Fell on floor. EXAM: CT HEAD WITHOUT CONTRAST CT CERVICAL SPINE WITHOUT CONTRAST TECHNIQUE: Multidetector CT imaging of the head and cervical spine was performed following the standard protocol without intravenous contrast. Multiplanar CT image reconstructions of the cervical spine were also generated. RADIATION DOSE REDUCTION: This exam was performed according to the departmental dose-optimization program which includes automated exposure control, adjustment of the mA and/or kV according to patient size and/or use of iterative reconstruction technique. COMPARISON:  None Available. FINDINGS:  CT HEAD FINDINGS Brain: No evidence of acute infarction, hemorrhage, hydrocephalus, extra-axial collection or mass lesion/mass effect. Vascular: No hyperdense vessel or unexpected calcification. Skull: Normal. Negative for fracture or focal lesion. Sinuses/Orbits: No acute finding. Other: None CT CERVICAL SPINE FINDINGS Alignment: There is reversal of normal cervical lordosis which likely reflects patient positioning. No posttraumatic malalignment. Skull base and vertebrae: No acute fracture. No primary bone lesion or focal pathologic process. Soft tissues and spinal canal: No prevertebral fluid or swelling. No visible canal hematoma. Disc levels: The vertebral body heights and disc spaces are well preserved. No significant spondylosis. Upper chest: Negative. Other: None IMPRESSION: 1. No acute intracranial abnormalities. 2. No evidence for cervical spine fracture or posttraumatic malalignment. 3. Reversal of normal cervical lordosis  which likely reflects patient positioning. Electronically Signed   By: Kerby Moors M.D.   On: 03/18/2022 07:17  ? ?US PELVIS (TRANSABDOMINAL ONLY) ? ?Result Date: 03/18/2022 ?CLINICAL DATA:  Heavy vaginal

## 2022-03-18 NOTE — ED Notes (Addendum)
1st blood transfusion rate changed to 999 mL/hr per Jarvis Newcomer, MD. MD also aware of patient c/o back and abd pain. No new orders placed at this time. ?

## 2022-03-18 NOTE — ED Notes (Signed)
Patient to OR at this time. TXA not in ER to start or send with patient. ?

## 2022-03-18 NOTE — Progress Notes (Signed)
Lori Pollard removal. ?Pt has had minimal blood loss since surgery.  (<132mL). ?Lori Pollard balloon deflated and removed.  Uterus firm. Minimal bleeding. ?

## 2022-03-18 NOTE — ED Notes (Signed)
Patient cleaned. Blood in bedding and chuck pads. New pads and bed liens placed at this time.   ?

## 2022-03-18 NOTE — Transfer of Care (Signed)
Immediate Anesthesia Transfer of Care Note ? ?Patient: Lori Pollard ? ?Procedure(s) Performed: Procedure(s): ?DILATATION AND EVACUATION (N/A) ? ?Patient Location: PACU ? ?Anesthesia Type:General ? ?Level of Consciousness: sedated ? ?Airway & Oxygen Therapy: Patient Spontanous Breathing and Patient connected to face mask oxygen ? ?Post-op Assessment: Report given to RN and Post -op Vital signs reviewed and stable ? ?Post vital signs: Reviewed and stable ? ?Last Vitals:  ?Vitals:  ? 03/18/22 0845 03/18/22 1008  ?BP: 116/81 102/66  ?Pulse: 60 67  ?Resp: 10 17  ?Temp:    ?SpO2: 100% 100%  ? ? ?Complications: No apparent anesthesia complications ?

## 2022-03-19 ENCOUNTER — Encounter: Payer: Self-pay | Admitting: Obstetrics and Gynecology

## 2022-03-19 LAB — BPAM RBC
Blood Product Expiration Date: 202305112359
Blood Product Expiration Date: 202306072359
Blood Product Expiration Date: 202306072359
Blood Product Expiration Date: 202306072359
ISSUE DATE / TIME: 202305090619
ISSUE DATE / TIME: 202305090721
ISSUE DATE / TIME: 202305090905
ISSUE DATE / TIME: 202305091335
Unit Type and Rh: 5100
Unit Type and Rh: 5100
Unit Type and Rh: 5100
Unit Type and Rh: 5100

## 2022-03-19 LAB — TYPE AND SCREEN
ABO/RH(D): O POS
Antibody Screen: NEGATIVE
Unit division: 0
Unit division: 0
Unit division: 0
Unit division: 0

## 2022-03-19 LAB — SURGICAL PATHOLOGY

## 2022-03-19 MED ORDER — GABAPENTIN 100 MG PO CAPS: 100.0000 mg | ORAL_CAPSULE | Freq: Four times a day (QID) | ORAL | 0 refills | Status: DC

## 2022-03-19 MED ORDER — DESOGESTREL-ETHINYL ESTRADIOL 0.15-0.02/0.01 MG (21/5) PO TABS: 1.0000 | ORAL_TABLET | Freq: Every day | ORAL | 0 refills | Status: AC

## 2022-03-19 MED ORDER — IBUPROFEN 600 MG PO TABS: 600.0000 mg | ORAL_TABLET | Freq: Four times a day (QID) | ORAL | 0 refills | Status: DC
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
## 2022-03-19 NOTE — Discharge Summary (Signed)
? ? ?Discharge Summary ? ?Admit date: 03/18/2022 ? ?Discharge Date and Time:03/19/2022  3:14 PM ? ?Discharge to:  Home ? ?Admission Diagnosis: Present on Admission: ? Acute blood loss anemia ?                   ? ?Discharge  Diagnoses: Principal Problem: ?  Acute blood loss anemia ? ? ?OR Procedures:   ?Procedure(s): ?DILATATION AND EVACUATION WITH JADA PLACEMENT ?Date ?------------------- ?  ?                           Discharge Day Progress Note: ? ? Subjective: ?  The patient does not have complaints.  She is ambulating well. She is taking PO well. Her pain is well controlled with her current medications. She is urinating without difficulty and is passing flatus.  She reports her period is slightly more than her regular menses. ? ? Objective: ? BP 114/71   Pulse 93   Temp 98 ?F (36.7 ?C) (Oral)   Resp 18   Ht 4\' 11"  (1.499 m)   Wt 49.9 kg   LMP 07/01/2021   SpO2 99%   Breastfeeding Unknown   BMI 22.22 kg/m?   ?   ?                        ?  ? Assessment: ?  Doing well.  Vaginal bleeding is manageable.  Likely retained products causing delayed postpartum hemorrhage. ?   ? Plan: ?       Discharge home. ?                      Medications as directed. ?  Will start OCPs to hasten the recovery of endometrial lining and decreased bleeding. ? ?Hospital Course: She presented to the ED with acute onset of vaginal bleeding and significant acute blood loss anemia.  She was promptly taken to the operating room and an endometrial evacuation with curettage was performed.  A Jegede system was placed.  It was removed several hours later and her bleeding was significantly reduced.  In the emergency department prior to and during her surgery she received packed red cells.  Her subsequent hemoglobin was noted to be 11. ? ? ?Condition at Discharge:  good ? ? Patient sat in the chair and ambulated around the nurses station multiple times without lightheadedness. ?Discharge Medications:  ?Allergies as of 03/19/2022   ?No Known  Allergies ?  ? ?  ?Medication List  ?  ? ?STOP taking these medications   ? ?amoxicillin-clavulanate 875-125 MG tablet ?Commonly known as: AUGMENTIN ?  ?buprenorphine 8 MG Subl SL tablet ?Commonly known as: SUBUTEX ?  ?buprenorphine-naloxone 8-2 mg Subl SL tablet ?Commonly known as: SUBOXONE ?  ?furosemide 20 MG tablet ?Commonly known as: LASIX ?  ?hydrOXYzine 10 MG tablet ?Commonly known as: ATARAX ?  ? ?  ? ?TAKE these medications   ? ?desogestrel-ethinyl estradiol 0.15-0.02/0.01 MG (21/5) tablet ?Commonly known as: Mircette ?Take 1 tablet by mouth at bedtime. ?  ?ferrous sulfate 325 (65 FE) MG tablet ?Take 325 mg by mouth daily with breakfast. ?  ?gabapentin 100 MG capsule ?Commonly known as: NEURONTIN ?Take 1 capsule (100 mg total) by mouth every 6 (six) hours. ?  ?ibuprofen 600 MG tablet ?Commonly known as: ADVIL ?Take 1 tablet (600 mg total) by mouth every 6 (six) hours. ?  ?multivitamin-prenatal 27-0.8 MG  Tabs tablet ?Take 1 tablet by mouth daily at 12 noon. ?  ? ?  ? ?  ?  ? ? ?  ?Discharge Care Instructions  ?(From admission, onward)  ?  ? ? ?  ? ?  Start     Ordered  ? 03/19/22 0000  No dressing needed       ?Comments: Keep wound area clean and dry as directed  ? 03/19/22 1513  ? ?  ?  ? ?  ? ? ? ?Follow Up:   ? Follow-up Information   ? ? Linzie Collin, MD Follow up in 2 week(s).   ?Specialties: Obstetrics and Gynecology, Radiology ?Contact information: ?[address] ?[address] ?[place] [address] ?[phone] ? ? ?  ?  ? ?  ?  ? ?  ? ? ?Elonda Husky, M.D. ?03/19/2022 ?3:14 PM ? ? ? ?

## 2022-03-19 NOTE — Progress Notes (Signed)
Patient discharged home in the company of Corporate treasurer and police.  ?Discharge instructions, when to follow up, and prescriptions reviewed with patient.  ?Patient verbalized understanding. ?Patient escorted out via wheelchair by corrections.  ? ?

## 2022-04-08 ENCOUNTER — Encounter: Payer: Self-pay | Admitting: Obstetrics and Gynecology

## 2022-04-09 ENCOUNTER — Ambulatory Visit: Payer: Medicaid Other | Admitting: Family Medicine

## 2022-04-09 VITALS — BP 93/65 | Ht 59.0 in | Wt 116.8 lb

## 2022-04-09 DIAGNOSIS — F32A Depression, unspecified: Secondary | ICD-10-CM

## 2022-04-09 DIAGNOSIS — Z113 Encounter for screening for infections with a predominantly sexual mode of transmission: Secondary | ICD-10-CM

## 2022-04-09 DIAGNOSIS — Z3009 Encounter for other general counseling and advice on contraception: Secondary | ICD-10-CM | POA: Diagnosis not present

## 2022-04-09 DIAGNOSIS — Z9851 Tubal ligation status: Secondary | ICD-10-CM

## 2022-04-09 LAB — HEMOGLOBIN: Hemoglobin: 11.9 g/dL (ref 11.1–15.9)

## 2022-04-09 LAB — WET PREP FOR TRICH, YEAST, CLUE
Trichomonas Exam: NEGATIVE
Yeast Exam: NEGATIVE

## 2022-04-09 NOTE — Progress Notes (Signed)
Pt here for PP.  Wet mount results reviewed, no treatment required.  Berdie Ogren, RN

## 2022-04-09 NOTE — Progress Notes (Signed)
[facility] Department  Postpartum Exam  Lori Pollard is a 31 y.o. [id number] female who presents for a postpartum visit. She is 6 weeks postpartum following a c-section with 2 subsequent D&E, 6 blood transfusions and Iron infusion.  I have fully reviewed the prenatal and intrapartum course. The delivery was at 39 gestational weeks.  Anesthesia: general. Postpartum course has been cient has had extended bleeding after delivery , she was elvatuated treated with D & E and blood and FE transfusions.  Baby is doing well- she's in cousin's care. Baby is feeding by bottle Rush Barer  . Bleeding no bleeding. Bowel function is normal. Bladder function is normal. Patient is not sexually active. Contraception method is tubal ligation. Postpartum depression screening: positive.   The pregnancy intention screening data noted above was reviewed. Potential methods of contraception were discussed. The patient elected to proceed with No data recorded.    Health Maintenance Due  Topic Date Due   COVID-19 Vaccine (1) Never done    The following portions of the patient's history were reviewed and updated as appropriate: allergies, current medications, past family history, past medical history, past social history, past surgical history, and problem list.  Review of Systems Pertinent items are noted in HPI.  Objective:  BP 93/65   Ht 4\' 11"  (1.499 m)   Wt 116 lb 12.8 oz (53 kg)   LMP 07/01/2021   BMI 23.59 kg/m    General:  alert   Breasts:  normal  Lungs: clear to auscultation bilaterally  Heart:  regular rate and rhythm, S1, S2 normal, no murmur, click, rub or gallop  Abdomen: soft, non-tender; bowel sounds normal; no masses,  no organomegaly   Wound NA  GU exam:  normal       Assessment:    1. Family planning BTL 02/16/2022  2. Postpartum care following Cesarean delivery  3.  Depression, unspecified  [place] 18.  Client was receiving services with [facility]  prior to her incarceration.  She is taking Prozac and Buspar daily.  Plans to reestablish her care at [facility] when released from jail.  She has reached out to the jail chaplain for counseling.  Plan:   Essential components of care per ACOG recommendations:  1.  Mood and well being: Patient with positive depression screening today. Reviewed local resources for support.  - Patient tobacco use? No.   - hx of drug use? Yes. Discussed support systems and outpatient/inpatient treatment options.  Client has had counseling with [facility]  2. Infant care and feeding:  -Patient currently breastmilk feeding? No.  -Social determinants of health (SDOH) reviewed in EPIC.  Client is incarcerated at this time, infant is being cared for by her cousin.  Client will meet judge Monday, June 4, to find our if she can be released.  3. Sexuality, contraception and birth spacing - Patient does not want a pregnancy in the next year.  Desired family size is 4 children.  - Reviewed reproductive life planning. Reviewed options based on patient desire and reproductive life plan. Patient is interested in Female Sterilization-BTL 4/9/2-23.  Risks, benefits, and typical effectiveness rates were reviewed.  Questions were answered.  Written information was also given to the patient to review.    The patient was counseled that the health department would not be able to provide her with well-woman care because of her permanent [facility] but she can receive STD services.  The patient was told to call with any further questions,  or with any concerns about this method of contraception.  Emphasized use of condoms 100% of the time for STI prevention.  Patient is not an ECP candidate d/t BTL   4. Sleep and fatigue -Encouraged family/partner/community support of 4 hrs of uninterrupted sleep to help with mood and fatigue.  Client states that she will have family support when sh is released from the jail.  5. Physical  Recovery  - Discussed patients delivery and complications. She describes her labor as mixed.  Client has hemorrhage x 2 (after delivery and 03/18/2022) d/t retained placental products.   - Patient had a C-section. Patient's incision healed. - Patient has urinary incontinence? No. - Patient is safe to resume physical and sexual activity  6.  Health Maintenance - HM due items addressed Yes -Lab tech was unable to draw blood work today.  Client will have this done at her PCP Diagnosis  Date Value Ref Range Status  07/14/2019   Final   NEGATIVE FOR INTRAEPITHELIAL LESIONS OR MALIGNANCY.  07/14/2019   Final   FUNGAL ORGANISMS PRESENT CONSISTENT WITH CANDIDA SPP.    -Breast Cancer screening indicated? No.   7. Chronic Disease/Pregnancy Condition follow up:  -Depression f/u with [facility] -Hepatitis C- Encouraged client to f/u with PCP regarding Hep C treatment and management. -Anemia during pregnancy- continue with daily FeSO 4.  HGB 11.9.  F/U with PCP.   Larene Pickett, FNP [facility] [employer], [facility]

## 2022-06-22 ENCOUNTER — Encounter (HOSPITAL_COMMUNITY): Payer: Self-pay | Admitting: Student

## 2022-06-22 ENCOUNTER — Inpatient Hospital Stay (HOSPITAL_COMMUNITY)
Admission: EM | Admit: 2022-06-22 | Discharge: 2022-06-24 | DRG: 481 | Disposition: A | Discharge status: Home / Self Care | Payer: Medicaid Other | Attending: Student | Admitting: Student

## 2022-06-22 ENCOUNTER — Emergency Department (HOSPITAL_COMMUNITY): Payer: Medicaid Other

## 2022-06-22 ENCOUNTER — Inpatient Hospital Stay (HOSPITAL_COMMUNITY): Payer: Medicaid Other

## 2022-06-22 ENCOUNTER — Other Ambulatory Visit: Payer: Self-pay

## 2022-06-22 DIAGNOSIS — S72002A Fracture of unspecified part of neck of left femur, initial encounter for closed fracture: Secondary | ICD-10-CM | POA: Diagnosis present

## 2022-06-22 DIAGNOSIS — S82402A Unspecified fracture of shaft of left fibula, initial encounter for closed fracture: Secondary | ICD-10-CM | POA: Diagnosis present

## 2022-06-22 DIAGNOSIS — Z79899 Other long term (current) drug therapy: Secondary | ICD-10-CM

## 2022-06-22 DIAGNOSIS — D62 Acute posthemorrhagic anemia: Secondary | ICD-10-CM | POA: Diagnosis not present

## 2022-06-22 DIAGNOSIS — F32A Depression, unspecified: Secondary | ICD-10-CM | POA: Diagnosis present

## 2022-06-22 DIAGNOSIS — Z5901 Sheltered homelessness: Secondary | ICD-10-CM | POA: Diagnosis not present

## 2022-06-22 DIAGNOSIS — F419 Anxiety disorder, unspecified: Secondary | ICD-10-CM | POA: Diagnosis present

## 2022-06-22 DIAGNOSIS — F1110 Opioid abuse, uncomplicated: Secondary | ICD-10-CM | POA: Diagnosis present

## 2022-06-22 DIAGNOSIS — S82202A Unspecified fracture of shaft of left tibia, initial encounter for closed fracture: Secondary | ICD-10-CM

## 2022-06-22 DIAGNOSIS — S72332A Displaced oblique fracture of shaft of left femur, initial encounter for closed fracture: Principal | ICD-10-CM

## 2022-06-22 DIAGNOSIS — Z818 Family history of other mental and behavioral disorders: Secondary | ICD-10-CM | POA: Diagnosis not present

## 2022-06-22 DIAGNOSIS — S72302A Unspecified fracture of shaft of left femur, initial encounter for closed fracture: Secondary | ICD-10-CM | POA: Diagnosis not present

## 2022-06-22 DIAGNOSIS — Y92410 Unspecified street and highway as the place of occurrence of the external cause: Secondary | ICD-10-CM

## 2022-06-22 DIAGNOSIS — Z20822 Contact with and (suspected) exposure to covid-19: Secondary | ICD-10-CM | POA: Diagnosis present

## 2022-06-22 DIAGNOSIS — F17210 Nicotine dependence, cigarettes, uncomplicated: Secondary | ICD-10-CM | POA: Diagnosis present

## 2022-06-22 LAB — COMPREHENSIVE METABOLIC PANEL
ALT: 39 U/L (ref 0–44)
AST: 40 U/L (ref 15–41)
Albumin: 4 g/dL (ref 3.5–5.0)
Alkaline Phosphatase: 60 U/L (ref 38–126)
Anion gap: 10 (ref 5–15)
BUN: 7 mg/dL (ref 6–20)
CO2: 23 mmol/L (ref 22–32)
Calcium: 9.2 mg/dL (ref 8.9–10.3)
Chloride: 104 mmol/L (ref 98–111)
Creatinine, Ser: 0.82 mg/dL (ref 0.44–1.00)
GFR, Estimated: >60 mL/min (ref >60)
Glucose, Bld: 126 mg/dL — ABNORMAL HIGH (ref 70–99)
Potassium: 3.8 mmol/L (ref 3.5–5.1)
Sodium: 137 mmol/L (ref 135–145)
Total Bilirubin: 1.4 mg/dL — ABNORMAL HIGH (ref 0.3–1.2)
Total Protein: 7.1 g/dL (ref 6.5–8.1)

## 2022-06-22 LAB — ETHANOL: Alcohol, Ethyl (B): <10 mg/dL (ref <10)

## 2022-06-22 LAB — I-STAT CHEM 8, ED
BUN: 7 mg/dL (ref 6–20)
Calcium, Ion: 1.04 mmol/L — ABNORMAL LOW (ref 1.15–1.40)
Chloride: 102 mmol/L (ref 98–111)
Creatinine, Ser: 0.7 mg/dL (ref 0.44–1.00)
Glucose, Bld: 125 mg/dL — ABNORMAL HIGH (ref 70–99)
HCT: 37 % (ref 36.0–46.0)
Hemoglobin: 12.6 g/dL (ref 12.0–15.0)
Potassium: 3.9 mmol/L (ref 3.5–5.1)
Sodium: 139 mmol/L (ref 135–145)
TCO2: 24 mmol/L (ref 22–32)

## 2022-06-22 LAB — RESP PANEL BY RT-PCR (FLU A&B, COVID) ARPGX2
Influenza A by PCR: NEGATIVE
Influenza B by PCR: NEGATIVE
SARS Coronavirus 2 by RT PCR: NEGATIVE

## 2022-06-22 LAB — CBC
HCT: 37.3 % (ref 36.0–46.0)
Hemoglobin: 13.9 g/dL (ref 12.0–15.0)
MCH: 32.5 pg (ref 26.0–34.0)
MCHC: 37.3 g/dL — ABNORMAL HIGH (ref 30.0–36.0)
MCV: 87.1 fL (ref 80.0–100.0)
Platelets: 312 10*3/uL (ref 150–400)
RBC: 4.28 MIL/uL (ref 3.87–5.11)
RDW: 12 % (ref 11.5–15.5)
WBC: 8.5 10*3/uL (ref 4.0–10.5)
nRBC: 0 % (ref 0.0–0.2)

## 2022-06-22 LAB — I-STAT BETA HCG BLOOD, ED (MC, WL, AP ONLY): I-stat hCG, quantitative: <5 m[IU]/mL (ref <5)

## 2022-06-22 MED ORDER — METOCLOPRAMIDE HCL 5 MG/ML IJ SOLN: 5.0000 mg | Freq: Three times a day (TID) | INTRAMUSCULAR | Status: DC | PRN

## 2022-06-22 MED ORDER — HYDROMORPHONE HCL 1 MG/ML IJ SOLN
1.0000 mg | Freq: Once | INTRAMUSCULAR | Status: AC
  Administered 2022-06-22: 1 mg via INTRAVENOUS
  Filled 2022-06-22: qty 1

## 2022-06-22 MED ORDER — ONDANSETRON HCL 4 MG/2ML IJ SOLN: 4.0000 mg | Freq: Four times a day (QID) | INTRAMUSCULAR | Status: DC | PRN

## 2022-06-22 MED ORDER — DIPHENHYDRAMINE HCL 12.5 MG/5ML PO ELIX: 12.5000 mg | ORAL_SOLUTION | ORAL | Status: DC | PRN

## 2022-06-22 MED ORDER — METHOCARBAMOL 1000 MG/10ML IJ SOLN
500.0000 mg | Freq: Four times a day (QID) | INTRAVENOUS | Status: DC | PRN
  Filled 2022-06-22: qty 5

## 2022-06-22 MED ORDER — METOCLOPRAMIDE HCL 10 MG PO TABS: 5.0000 mg | ORAL_TABLET | Freq: Three times a day (TID) | ORAL | Status: DC | PRN

## 2022-06-22 MED ORDER — HYDRALAZINE HCL 10 MG PO TABS: 10.0000 mg | ORAL_TABLET | Freq: Four times a day (QID) | ORAL | Status: DC | PRN

## 2022-06-22 MED ORDER — IOHEXOL 300 MG/ML  SOLN
100.0000 mL | Freq: Once | INTRAMUSCULAR | Status: AC | PRN
Start: 2022-06-22 — End: 2022-06-22
  Administered 2022-06-22: 100 mL via INTRAVENOUS

## 2022-06-22 MED ORDER — METHOCARBAMOL 500 MG PO TABS
500.0000 mg | ORAL_TABLET | Freq: Four times a day (QID) | ORAL | Status: DC | PRN
  Administered 2022-06-23 – 2022-06-24 (×2): 500 mg via ORAL
  Filled 2022-06-22 (×2): qty 1

## 2022-06-22 MED ORDER — ACETAMINOPHEN 500 MG PO TABS: 1000.0000 mg | ORAL_TABLET | Freq: Once | ORAL | Status: DC

## 2022-06-22 MED ORDER — HYDROMORPHONE HCL 1 MG/ML IJ SOLN
0.5000 mg | INTRAMUSCULAR | Status: DC | PRN
  Administered 2022-06-22 – 2022-06-24 (×5): 1 mg via INTRAVENOUS
  Filled 2022-06-22 (×6): qty 1

## 2022-06-22 MED ORDER — POTASSIUM CHLORIDE IN NACL 20-0.9 MEQ/L-% IV SOLN
INTRAVENOUS | Status: DC
  Filled 2022-06-22 (×5): qty 1000

## 2022-06-22 MED ORDER — DOCUSATE SODIUM 100 MG PO CAPS
100.0000 mg | ORAL_CAPSULE | Freq: Two times a day (BID) | ORAL | Status: DC
  Administered 2022-06-22 – 2022-06-24 (×3): 100 mg via ORAL
  Filled 2022-06-22 (×3): qty 1

## 2022-06-22 MED ORDER — ACETAMINOPHEN 325 MG PO TABS: 325.0000 mg | ORAL_TABLET | Freq: Four times a day (QID) | ORAL | Status: DC | PRN

## 2022-06-22 MED ORDER — OXYCODONE HCL 5 MG PO TABS
10.0000 mg | ORAL_TABLET | ORAL | Status: DC | PRN
  Administered 2022-06-23 – 2022-06-24 (×4): 15 mg via ORAL
  Filled 2022-06-22 (×4): qty 3

## 2022-06-22 MED ORDER — ENOXAPARIN SODIUM 40 MG/0.4ML IJ SOSY
40.0000 mg | PREFILLED_SYRINGE | INTRAMUSCULAR | Status: DC
  Administered 2022-06-22: 40 mg via SUBCUTANEOUS
  Filled 2022-06-22 (×2): qty 0.4

## 2022-06-22 MED ORDER — ONDANSETRON HCL 4 MG/2ML IJ SOLN
4.0000 mg | Freq: Once | INTRAMUSCULAR | Status: AC
  Administered 2022-06-22: 4 mg via INTRAVENOUS
  Filled 2022-06-22: qty 2

## 2022-06-22 MED ORDER — ONDANSETRON HCL 4 MG PO TABS: 4.0000 mg | ORAL_TABLET | Freq: Four times a day (QID) | ORAL | Status: DC | PRN

## 2022-06-22 MED ORDER — ACETAMINOPHEN 500 MG PO TABS
1000.0000 mg | ORAL_TABLET | Freq: Four times a day (QID) | ORAL | Status: DC
Start: 2022-06-22 — End: 2022-06-24
  Administered 2022-06-22 – 2022-06-24 (×5): 1000 mg via ORAL
  Filled 2022-06-22 (×5): qty 2

## 2022-06-22 MED ORDER — OXYCODONE HCL 5 MG PO TABS
5.0000 mg | ORAL_TABLET | ORAL | Status: DC | PRN
  Administered 2022-06-22 – 2022-06-23 (×2): 10 mg via ORAL
  Filled 2022-06-22 (×2): qty 2
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
## 2022-06-22 NOTE — ED Notes (Signed)
Patient transported to CT 
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
## 2022-06-22 NOTE — ED Notes (Signed)
Patient transported to x-ray. ?

## 2022-06-22 NOTE — Progress Notes (Signed)
Orthopedic Tech Progress Note Patient Details:  Lori Pollard 01-18-1991 [id number]  Ortho Devices Type of Ortho Device: Post (short leg) splint, Stirrup splint Ortho Device/Splint Location: lle Ortho Device/Splint Interventions: Ordered, Application, Adjustment   Post Interventions Patient Tolerated: Fair Instructions Provided: Care of device, Adjustment of device  [name] 06/22/2022, 9:46 PM
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
## 2022-06-22 NOTE — Progress Notes (Signed)
Orthopedic Tech Progress Note Patient Details:  Lori Pollard [birth year] [id number]  Musculoskeletal Traction Type of Traction: Bucks Skin Traction Traction Location: lle Traction Weight: 15 lbs   Post Interventions Patient Tolerated: Fair Instructions Provided: Care of device, Adjustment of device  [name] 06/22/2022, 9:46 PM

## 2022-06-22 NOTE — ED Notes (Signed)
Pt has good bilateral pulses. 2+

## 2022-06-23 ENCOUNTER — Inpatient Hospital Stay (HOSPITAL_COMMUNITY): Payer: Medicaid Other | Admitting: Anesthesiology

## 2022-06-23 ENCOUNTER — Inpatient Hospital Stay (HOSPITAL_COMMUNITY): Payer: Medicaid Other

## 2022-06-23 ENCOUNTER — Encounter (HOSPITAL_COMMUNITY): Payer: Self-pay | Admitting: Student

## 2022-06-23 ENCOUNTER — Encounter (HOSPITAL_COMMUNITY)
Admission: EM | Disposition: A | Discharge status: Home / Self Care | Payer: Self-pay | Source: Home / Self Care | Attending: Student

## 2022-06-23 DIAGNOSIS — S82202A Unspecified fracture of shaft of left tibia, initial encounter for closed fracture: Secondary | ICD-10-CM | POA: Diagnosis not present

## 2022-06-23 DIAGNOSIS — S72302A Unspecified fracture of shaft of left femur, initial encounter for closed fracture: Secondary | ICD-10-CM

## 2022-06-23 HISTORY — PX: FEMUR IM NAIL: SHX1597

## 2022-06-23 HISTORY — PX: TIBIA IM NAIL INSERTION: SHX2516

## 2022-06-23 LAB — SAMPLE TO BLOOD BANK

## 2022-06-23 SURGERY — INSERTION, INTRAMEDULLARY ROD, FEMUR, RETROGRADE
Anesthesia: General | Site: Leg Upper | Laterality: Left

## 2022-06-23 MED ORDER — SUGAMMADEX SODIUM 200 MG/2ML IV SOLN
INTRAVENOUS | Status: DC | PRN
  Administered 2022-06-23: 100 mg via INTRAVENOUS

## 2022-06-23 MED ORDER — MIDAZOLAM HCL 2 MG/2ML IJ SOLN
INTRAMUSCULAR | Status: DC | PRN
  Administered 2022-06-23: 2 mg via INTRAVENOUS

## 2022-06-23 MED ORDER — HYDROMORPHONE HCL 1 MG/ML IJ SOLN
INTRAMUSCULAR | Status: AC
  Filled 2022-06-23: qty 1

## 2022-06-23 MED ORDER — 0.9 % SODIUM CHLORIDE (POUR BTL) OPTIME
TOPICAL | Status: DC | PRN
  Administered 2022-06-23: 1000 mL

## 2022-06-23 MED ORDER — DEXAMETHASONE SODIUM PHOSPHATE 10 MG/ML IJ SOLN
INTRAMUSCULAR | Status: AC
  Filled 2022-06-23: qty 1

## 2022-06-23 MED ORDER — LIDOCAINE 2% (20 MG/ML) 5 ML SYRINGE
INTRAMUSCULAR | Status: AC
  Filled 2022-06-23: qty 5

## 2022-06-23 MED ORDER — ROCURONIUM BROMIDE 10 MG/ML (PF) SYRINGE
PREFILLED_SYRINGE | INTRAVENOUS | Status: DC | PRN
  Administered 2022-06-23: 40 mg via INTRAVENOUS
  Administered 2022-06-23: 20 mg via INTRAVENOUS

## 2022-06-23 MED ORDER — LIDOCAINE 2% (20 MG/ML) 5 ML SYRINGE
INTRAMUSCULAR | Status: DC | PRN
  Administered 2022-06-23: 60 mg via INTRAVENOUS

## 2022-06-23 MED ORDER — CEFAZOLIN SODIUM-DEXTROSE 2-4 GM/100ML-% IV SOLN
2.0000 g | Freq: Three times a day (TID) | INTRAVENOUS | Status: AC
  Administered 2022-06-23 – 2022-06-24 (×3): 2 g via INTRAVENOUS
  Filled 2022-06-23 (×3): qty 100

## 2022-06-23 MED ORDER — FENTANYL CITRATE (PF) 250 MCG/5ML IJ SOLN
INTRAMUSCULAR | Status: AC
  Filled 2022-06-23: qty 5

## 2022-06-23 MED ORDER — KETAMINE HCL 10 MG/ML IJ SOLN
INTRAMUSCULAR | Status: DC | PRN
  Administered 2022-06-23: 10 mg via INTRAVENOUS
  Administered 2022-06-23: 20 mg via INTRAVENOUS

## 2022-06-23 MED ORDER — OXYCODONE HCL 5 MG PO TABS: 5.0000 mg | ORAL_TABLET | Freq: Once | ORAL | Status: DC | PRN

## 2022-06-23 MED ORDER — PROPOFOL 10 MG/ML IV BOLUS
INTRAVENOUS | Status: DC | PRN
  Administered 2022-06-23: 120 mg via INTRAVENOUS

## 2022-06-23 MED ORDER — KETOROLAC TROMETHAMINE 15 MG/ML IJ SOLN
15.0000 mg | Freq: Four times a day (QID) | INTRAMUSCULAR | Status: DC
  Administered 2022-06-23 – 2022-06-24 (×5): 15 mg via INTRAVENOUS
  Filled 2022-06-23 (×5): qty 1

## 2022-06-23 MED ORDER — HYDROMORPHONE HCL 1 MG/ML IJ SOLN
INTRAMUSCULAR | Status: AC
  Filled 2022-06-23: qty 2

## 2022-06-23 MED ORDER — PROPOFOL 10 MG/ML IV BOLUS
INTRAVENOUS | Status: AC
  Filled 2022-06-23: qty 20

## 2022-06-23 MED ORDER — FENTANYL CITRATE (PF) 100 MCG/2ML IJ SOLN
INTRAMUSCULAR | Status: DC | PRN
  Administered 2022-06-23 (×3): 50 ug via INTRAVENOUS

## 2022-06-23 MED ORDER — ACETAMINOPHEN 10 MG/ML IV SOLN: INTRAVENOUS | Status: DC | PRN

## 2022-06-23 MED ORDER — VANCOMYCIN HCL 1000 MG IV SOLR
INTRAVENOUS | Status: AC
  Filled 2022-06-23: qty 20

## 2022-06-23 MED ORDER — KETAMINE HCL 50 MG/5ML IJ SOSY
PREFILLED_SYRINGE | INTRAMUSCULAR | Status: AC
  Filled 2022-06-23: qty 5

## 2022-06-23 MED ORDER — ENOXAPARIN SODIUM 40 MG/0.4ML IJ SOSY
40.0000 mg | PREFILLED_SYRINGE | INTRAMUSCULAR | Status: DC
  Administered 2022-06-24: 40 mg via SUBCUTANEOUS
  Filled 2022-06-23: qty 0.4

## 2022-06-23 MED ORDER — POLYETHYLENE GLYCOL 3350 17 G PO PACK: 17.0000 g | PACK | Freq: Every day | ORAL | Status: DC | PRN

## 2022-06-23 MED ORDER — BUSPIRONE HCL 5 MG PO TABS: 15.0000 mg | ORAL_TABLET | Freq: Two times a day (BID) | ORAL | Status: DC

## 2022-06-23 MED ORDER — DEXAMETHASONE SODIUM PHOSPHATE 10 MG/ML IJ SOLN
INTRAMUSCULAR | Status: DC | PRN
  Administered 2022-06-23: 5 mg via INTRAVENOUS

## 2022-06-23 MED ORDER — KETOROLAC TROMETHAMINE 30 MG/ML IJ SOLN
INTRAMUSCULAR | Status: DC | PRN
  Administered 2022-06-23: 30 mg via INTRAVENOUS

## 2022-06-23 MED ORDER — CEFAZOLIN SODIUM 1 G IJ SOLR
INTRAMUSCULAR | Status: AC
Start: 2022-06-23 — End: ?
  Filled 2022-06-23: qty 20

## 2022-06-23 MED ORDER — MIDAZOLAM HCL 2 MG/2ML IJ SOLN
INTRAMUSCULAR | Status: AC
  Filled 2022-06-23: qty 2

## 2022-06-23 MED ORDER — ACETAMINOPHEN 10 MG/ML IV SOLN
INTRAVENOUS | Status: DC | PRN
  Administered 2022-06-23: 1000 mg via INTRAVENOUS

## 2022-06-23 MED ORDER — ROCURONIUM BROMIDE 10 MG/ML (PF) SYRINGE
PREFILLED_SYRINGE | INTRAVENOUS | Status: AC
  Filled 2022-06-23: qty 10

## 2022-06-23 MED ORDER — KETOROLAC TROMETHAMINE 30 MG/ML IJ SOLN
INTRAMUSCULAR | Status: AC
  Filled 2022-06-23: qty 1

## 2022-06-23 MED ORDER — HYDROMORPHONE HCL 1 MG/ML IJ SOLN
0.2500 mg | INTRAMUSCULAR | Status: DC | PRN
  Administered 2022-06-23 (×4): 0.5 mg via INTRAVENOUS

## 2022-06-23 MED ORDER — ONDANSETRON HCL 4 MG/2ML IJ SOLN
INTRAMUSCULAR | Status: DC | PRN
  Administered 2022-06-23: 4 mg via INTRAVENOUS

## 2022-06-23 MED ORDER — FERROUS SULFATE 325 (65 FE) MG PO TABS
325.0000 mg | ORAL_TABLET | Freq: Every day | ORAL | Status: DC
  Administered 2022-06-24: 325 mg via ORAL
  Filled 2022-06-23: qty 1

## 2022-06-23 MED ORDER — ACETAMINOPHEN 10 MG/ML IV SOLN
INTRAVENOUS | Status: AC
  Filled 2022-06-23: qty 100

## 2022-06-23 MED ORDER — LACTATED RINGERS IV SOLN: INTRAVENOUS | Status: DC | PRN

## 2022-06-23 MED ORDER — ONDANSETRON HCL 4 MG/2ML IJ SOLN
INTRAMUSCULAR | Status: AC
  Filled 2022-06-23: qty 2

## 2022-06-23 MED ORDER — OXYCODONE HCL 5 MG/5ML PO SOLN: 5.0000 mg | Freq: Once | ORAL | Status: DC | PRN

## 2022-06-23 MED ORDER — CEFAZOLIN SODIUM-DEXTROSE 2-3 GM-%(50ML) IV SOLR
INTRAVENOUS | Status: DC | PRN
  Administered 2022-06-23: 2 g via INTRAVENOUS

## 2022-06-23 MED ORDER — AMISULPRIDE (ANTIEMETIC) 5 MG/2ML IV SOLN: 10.0000 mg | Freq: Once | INTRAVENOUS | Status: DC | PRN

## 2022-06-23 SURGICAL SUPPLY — 79 items
BAG COUNTER SPONGE SURGICOUNT (BAG) ×3 IMPLANT
BIT DRILL CALIBRATED 4.2 (BIT) IMPLANT
BIT DRILL CANN QC 12.8 (BIT) ×1 IMPLANT
BIT DRILL FLEXIBLE LONG 12 (BIT) ×1 IMPLANT
BIT DRILL SHORT 4.2 (BIT) IMPLANT
BLADE SURG 10 STRL SS (BLADE) ×6 IMPLANT
BNDG COHESIVE 4X5 TAN STRL (GAUZE/BANDAGES/DRESSINGS) ×2 IMPLANT
BNDG ELASTIC 4X5.8 VLCR STR LF (GAUZE/BANDAGES/DRESSINGS) ×3 IMPLANT
BNDG ELASTIC 6X5.8 VLCR STR LF (GAUZE/BANDAGES/DRESSINGS) ×3 IMPLANT
BNDG GAUZE ELAST 4 BULKY (GAUZE/BANDAGES/DRESSINGS) ×2 IMPLANT
BRUSH SCRUB EZ PLAIN DRY (MISCELLANEOUS) ×5 IMPLANT
CHLORAPREP W/TINT 26 (MISCELLANEOUS) ×3 IMPLANT
COVER MAYO STAND STRL (DRAPES) ×3 IMPLANT
COVER SURGICAL LIGHT HANDLE (MISCELLANEOUS) ×6 IMPLANT
DERMABOND ADVANCED (GAUZE/BANDAGES/DRESSINGS) ×3
DERMABOND ADVANCED .7 DNX12 (GAUZE/BANDAGES/DRESSINGS) ×2 IMPLANT
DRAPE C-ARM 42X72 X-RAY (DRAPES) ×3 IMPLANT
DRAPE C-ARMOR (DRAPES) ×3 IMPLANT
DRAPE HALF SHEET 40X57 (DRAPES) ×6 IMPLANT
DRAPE IMP U-DRAPE 54X76 (DRAPES) ×6 IMPLANT
DRAPE INCISE IOBAN 66X45 STRL (DRAPES) ×2 IMPLANT
DRAPE ORTHO SPLIT 77X108 STRL (DRAPES) ×6
DRAPE SURG 17X23 STRL (DRAPES) ×3 IMPLANT
DRAPE SURG ORHT 6 SPLT 77X108 (DRAPES) ×4 IMPLANT
DRAPE U-SHAPE 47X51 STRL (DRAPES) ×3 IMPLANT
DRESSING MEPILEX FLEX 4X4 (GAUZE/BANDAGES/DRESSINGS) IMPLANT
DRILL BIT CALIBRATED 4.2 (BIT) ×3 IMPLANT
DRILL BIT SHORT 4.2 (BIT) ×6
DRSG ADAPTIC 3X8 NADH LF (GAUZE/BANDAGES/DRESSINGS) ×2 IMPLANT
DRSG MEPILEX FLEX 4X4 (GAUZE/BANDAGES/DRESSINGS) ×18 IMPLANT
ELECT REM PT RETURN 9FT ADLT (ELECTROSURGICAL) ×3 IMPLANT
ELECTRODE REM PT RTRN 9FT ADLT (ELECTROSURGICAL) ×2 IMPLANT
GAUZE SPONGE 4X4 12PLY STRL (GAUZE/BANDAGES/DRESSINGS) ×3 IMPLANT
GLOVE BIO SURGEON STRL SZ 6.5 (GLOVE) ×8 IMPLANT
GLOVE BIO SURGEON STRL SZ7.5 (GLOVE) ×11 IMPLANT
GLOVE BIOGEL PI IND STRL 6.5 (GLOVE) ×2 IMPLANT
GLOVE BIOGEL PI IND STRL 7.5 (GLOVE) ×2 IMPLANT
GLOVE BIOGEL PI INDICATOR 6.5 (GLOVE) ×1
GLOVE BIOGEL PI INDICATOR 7.5 (GLOVE) ×1
GOWN STRL REUS W/ TWL LRG LVL3 (GOWN DISPOSABLE) ×4 IMPLANT
GOWN STRL REUS W/TWL LRG LVL3 (GOWN DISPOSABLE) ×6
GUIDEWIRE 2.8 THREAD 450 L ST (WIRE) ×3 IMPLANT
GUIDEWIRE 3.2X400 (WIRE) ×1 IMPLANT
KIT BASIN OR (CUSTOM PROCEDURE TRAY) ×3 IMPLANT
KIT TURNOVER KIT B (KITS) ×3 IMPLANT
NAIL IM RFN-A 9X300 5DEG STRL (Nail) ×1 IMPLANT
NAIL TIBIAL 9X285 STRL (Nail) ×1 IMPLANT
PACK ORTHO EXTREMITY (CUSTOM PROCEDURE TRAY) ×2 IMPLANT
PACK TOTAL JOINT (CUSTOM PROCEDURE TRAY) ×3 IMPLANT
PAD ARMBOARD 7.5X6 YLW CONV (MISCELLANEOUS) ×5 IMPLANT
PAD CAST 4YDX4 CTTN HI CHSV (CAST SUPPLIES) IMPLANT
PADDING CAST COTTON 4X4 STRL (CAST SUPPLIES) ×3
PADDING CAST COTTON 6X4 STRL (CAST SUPPLIES) ×1 IMPLANT
REAMER ROD 3.8 BALL TIP 3X950 (ORTHOPEDIC DISPOSABLE SUPPLIES) ×1 IMPLANT
SCREW CANN 6.5 TI LT 70 (Screw) ×1 IMPLANT
SCREW CANN ST 6.5X75 (Screw) ×1 IMPLANT
SCREW CANN ST 6.5X85 (Screw) ×1 IMPLANT
SCREW LOCK 28X5XLOPRFL (Screw) IMPLANT
SCREW LOCK 58X5X IM NL (Screw) IMPLANT
SCREW LOCK IM 5X36 (Screw) ×3 IMPLANT
SCREW LOCK IM NAIL 5X30 (Screw) ×1 IMPLANT
SCREW LOCK IM NAIL 5X58 (Screw) ×3 IMPLANT
SCREW LOCK IM NL 5X38 (Screw) ×2 IMPLANT
SCREW LOCK LP 5.36 (Screw) ×1 IMPLANT
SCREW LOCK LP 5X28 (Screw) ×3 IMPLANT
SCREW LOCK LP 5X52 (Screw) ×1 IMPLANT
SCREW LOCK X25 36X5X IM (Screw) IMPLANT
SPONGE T-LAP 18X18 ~~LOC~~+RFID (SPONGE) ×3 IMPLANT
STAPLER VISISTAT 35W (STAPLE) ×2 IMPLANT
SUT MNCRL AB 3-0 PS2 18 (SUTURE) ×5 IMPLANT
SUT VIC AB 0 CT1 27 (SUTURE) ×12
SUT VIC AB 0 CT1 27XBRD ANBCTR (SUTURE) IMPLANT
SUT VIC AB 2-0 CT1 27 (SUTURE) ×12
SUT VIC AB 2-0 CT1 TAPERPNT 27 (SUTURE) IMPLANT
TOWEL GREEN STERILE (TOWEL DISPOSABLE) ×6 IMPLANT
TOWEL GREEN STERILE FF (TOWEL DISPOSABLE) ×3 IMPLANT
TRAY CATH INTERMITTENT SS 16FR (CATHETERS) ×1 IMPLANT
TUBE CONNECTING 12X1/4 (SUCTIONS) ×2 IMPLANT
YANKAUER SUCT BULB TIP NO VENT (SUCTIONS) ×3 IMPLANT

## 2022-06-23 NOTE — Interval H&P Note (Signed)
History and Physical Interval Note:  06/23/2022 7:15 AM  Lori Pollard  has presented today for surgery, with the diagnosis of Left femur fracture and left tibia fracture.  The various methods of treatment have been discussed with the patient and family. After consideration of risks, benefits and other options for treatment, the patient has consented to  Procedure(s): INTRAMEDULLARY (IM) RETROGRADE FEMORAL NAILING (Left) INTRAMEDULLARY (IM) NAIL TIBIAL (Left) as a surgical intervention.  The patient's history has been reviewed, patient examined, no change in status, stable for surgery.  I have reviewed the patient's chart and labs.  Questions were answered to the patient's satisfaction.     Caryn Bee P Waver Dibiasio

## 2022-06-23 NOTE — Transfer of Care (Signed)
Immediate Anesthesia Transfer of Care Note  Patient: Lori Pollard  Procedure(s) Performed: INTRAMEDULLARY (IM) RETROGRADE FEMORAL NAILING (Left: Leg Upper) INTRAMEDULLARY (IM) NAIL TIBIAL (Left)  Patient Location: PACU  Anesthesia Type:General  Level of Consciousness: drowsy and patient cooperative  Airway & Oxygen Therapy: Patient Spontanous Breathing  Post-op Assessment: Report given to RN  Post vital signs: Reviewed and stable  Last Vitals:  Vitals Value Taken Time  BP 125/85 06/23/22 1017  Temp    Pulse 83 06/23/22 1018  Resp 11 06/23/22 1018  SpO2 99 % 06/23/22 1018  Vitals shown include unvalidated device data.  Last Pain:  Vitals:   06/23/22 0450  TempSrc:   PainSc: Asleep         Complications: No notable events documented.

## 2022-06-23 NOTE — Anesthesia Postprocedure Evaluation (Signed)
Anesthesia Post Note  Patient: Lori Pollard  Procedure(s) Performed: INTRAMEDULLARY (IM) RETROGRADE FEMORAL NAILING (Left: Leg Upper) INTRAMEDULLARY (IM) NAIL TIBIAL (Left)     Patient location during evaluation: PACU Anesthesia Type: General Level of consciousness: awake and alert Pain management: pain level controlled Vital Signs Assessment: post-procedure vital signs reviewed and stable Respiratory status: spontaneous breathing, nonlabored ventilation, respiratory function stable and patient connected to nasal cannula oxygen Cardiovascular status: blood pressure returned to baseline and stable Postop Assessment: no apparent nausea or vomiting Anesthetic complications: no   No notable events documented.  Last Vitals:  Vitals:   06/23/22 1145 06/23/22 1555  BP: 129/79 133/88  Pulse: 73 74  Resp: 13 17  Temp: 36.6 C 36.9 C  SpO2: 97% 99%    Last Pain:  Vitals:   06/23/22 1555  TempSrc: Oral  PainSc:                  Kennieth Rad

## 2022-06-23 NOTE — Plan of Care (Signed)
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
## 2022-06-23 NOTE — Anesthesia Procedure Notes (Signed)
Procedure Name: Intubation Date/Time: 06/23/2022 7:38 AM  Performed by: Barrington Ellison, CRNAPre-anesthesia Checklist: Patient identified, Emergency Drugs available, Suction available and Patient being monitored Patient Re-evaluated:Patient Re-evaluated prior to induction Oxygen Delivery Method: Circle System Utilized Preoxygenation: Pre-oxygenation with 100% oxygen Induction Type: IV induction Ventilation: Mask ventilation without difficulty Laryngoscope Size: Mac and 3 Grade View: Grade I Tube type: Oral Tube size: 7.0 mm Number of attempts: 1 Airway Equipment and Method: Stylet and Oral airway Placement Confirmation: ETT inserted through vocal cords under direct vision, positive ETCO2 and breath sounds checked- equal and bilateral Secured at: 21 cm Tube secured with: Tape Dental Injury: Teeth and Oropharynx as per pre-operative assessment

## 2022-06-23 NOTE — Anesthesia Preprocedure Evaluation (Addendum)
Anesthesia Evaluation  Patient identified by MRN, date of birth, ID band Patient awake    Reviewed: Allergy & Precautions, NPO status , Patient's Chart, lab work & pertinent test results  Airway Mallampati: II  TM Distance: >3 FB Neck ROM: Full    Dental  (+) Dental Advisory Given   Pulmonary former smoker,    breath sounds clear to auscultation       Cardiovascular negative cardio ROS   Rhythm:Regular Rate:Normal     Neuro/Psych negative neurological ROS     GI/Hepatic negative GI ROS, (+)     substance abuse  , Hepatitis -, C  Endo/Other  negative endocrine ROS  Renal/GU negative Renal ROS     Musculoskeletal   Abdominal   Peds  Hematology negative hematology ROS (+)   Anesthesia Other Findings   Reproductive/Obstetrics                            Lab Results  Component Value Date   WBC 8.5 06/22/2022   HGB 12.6 06/22/2022   HCT 37.0 06/22/2022   MCV 87.1 06/22/2022   PLT 312 06/22/2022   Lab Results  Component Value Date   CREATININE 0.70 06/22/2022   BUN 7 06/22/2022   NA 139 06/22/2022   K 3.9 06/22/2022   CL 102 06/22/2022   CO2 23 06/22/2022    Anesthesia Physical Anesthesia Plan  ASA: 2  Anesthesia Plan: General   Post-op Pain Management: Tylenol PO (pre-op)*, Toradol IV (intra-op)*, Ketamine IV* and Dilaudid IV   Induction: Intravenous  PONV Risk Score and Plan: 3 and Dexamethasone, Ondansetron, Midazolam and Treatment may vary due to age or medical condition  Airway Management Planned: Oral ETT  Additional Equipment: None  Intra-op Plan:   Post-operative Plan: Extubation in OR  Informed Consent: I have reviewed the patients History and Physical, chart, labs and discussed the procedure including the risks, benefits and alternatives for the proposed anesthesia with the patient or authorized representative who has indicated his/her understanding and  acceptance.     Dental advisory given  Plan Discussed with: CRNA  Anesthesia Plan Comments:         Anesthesia Quick Evaluation
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
## 2022-06-24 LAB — BASIC METABOLIC PANEL
Anion gap: 9 (ref 5–15)
BUN: 7 mg/dL (ref 6–20)
CO2: 22 mmol/L (ref 22–32)
Calcium: 8.3 mg/dL — ABNORMAL LOW (ref 8.9–10.3)
Chloride: 106 mmol/L (ref 98–111)
Creatinine, Ser: 0.65 mg/dL (ref 0.44–1.00)
GFR, Estimated: >60 mL/min (ref >60)
Glucose, Bld: 105 mg/dL — ABNORMAL HIGH (ref 70–99)
Potassium: 3.9 mmol/L (ref 3.5–5.1)
Sodium: 137 mmol/L (ref 135–145)

## 2022-06-24 LAB — CBC
HCT: 26.8 % — ABNORMAL LOW (ref 36.0–46.0)
Hemoglobin: 9.6 g/dL — ABNORMAL LOW (ref 12.0–15.0)
MCH: 32.2 pg (ref 26.0–34.0)
MCHC: 35.8 g/dL (ref 30.0–36.0)
MCV: 89.9 fL (ref 80.0–100.0)
Platelets: 130 10*3/uL — ABNORMAL LOW (ref 150–400)
RBC: 2.98 MIL/uL — ABNORMAL LOW (ref 3.87–5.11)
RDW: 12.3 % (ref 11.5–15.5)
WBC: 7.7 10*3/uL (ref 4.0–10.5)
nRBC: 0 % (ref 0.0–0.2)

## 2022-06-24 LAB — VITAMIN D 25 HYDROXY (VIT D DEFICIENCY, FRACTURES): Vit D, 25-Hydroxy: 42.51 ng/mL (ref 30–100)

## 2022-06-24 LAB — HIV ANTIBODY (ROUTINE TESTING W REFLEX): HIV Screen 4th Generation wRfx: NONREACTIVE

## 2022-06-24 MED ORDER — OXYCODONE HCL 10 MG PO TABS
5.0000 mg | ORAL_TABLET | ORAL | 0 refills | Status: DC | PRN
Start: 2022-06-24 — End: 2022-06-25

## 2022-06-24 MED ORDER — ASPIRIN 325 MG PO TABS: 325.0000 mg | ORAL_TABLET | Freq: Every day | ORAL | 0 refills | Status: AC

## 2022-06-24 MED ORDER — METHOCARBAMOL 500 MG PO TABS: 500.0000 mg | ORAL_TABLET | Freq: Four times a day (QID) | ORAL | 0 refills | Status: DC | PRN

## 2022-06-24 NOTE — TOC CAGE-AID Note (Signed)
Transition of Care [facility]) - CAGE-AID Screening   Patient Details  Name: Lori Pollard MRN: [id number] Date of Birth: Sep 25, 1991  Transition of Care [facility]) CM/SW Contact:    Coralee Pesa, [place] Phone Number: 06/24/2022, 5:12 PM   Clinical Narrative: CSW met with pt at bedside to complete CAGE-AID assessment. Resources provided, see SU consult note.   CAGE-AID Screening:    Have You Ever Felt You Ought to Cut Down on Your Drinking or Drug Use?: Yes Have People Annoyed You By Critizing Your Drinking Or Drug Use?: Yes Have You Felt Bad Or Guilty About Your Drinking Or Drug Use?: Yes Have You Ever Had a Drink or Used Drugs First Thing In The Morning to Steady Your Nerves or to Get Rid of a Hangover?: Yes CAGE-AID Score: 4  Substance Abuse Education Offered: Yes  Substance abuse interventions: Scientist, clinical (histocompatibility and immunogenetics)

## 2022-06-24 NOTE — Plan of Care (Signed)

## 2022-06-24 NOTE — Social Work (Signed)
CSW acknowledges consult for substance use resources and education. CSW met with pt at bedside, she requests her boyfriend stay for the assessment. Pt notes she uses heroin daily and has for several years. She was clean for a year and a half, but her children were removed from her home, and she relapsed. She noted she is interested in quitting and CSW provided resources for treatment options. Pt seemed to perseverate on Suboxone and receiving those treatments. This was discussed, but she was advised she would need to have this conversation with the medical team. She was also advised that utilizing counseling as well could be beneficial for her treatment. Pt noted understanding and expressed no other concerns as far as social determinants of health.

## 2022-06-24 NOTE — Progress Notes (Signed)
IV removed. Equipment at bedside with patient belongings. Boyfriend at bedside. AVS education completed.

## 2022-06-24 NOTE — Progress Notes (Signed)
Orthopedic Tech Progress Note Patient Details:  Lori Pollard May 21, 1991 [id number]  Ortho Devices Type of Ortho Device: CAM walker Ortho Device/Splint Location: LLE Ortho Device/Splint Interventions: Ordered   Post Interventions Patient Tolerated: Well Instructions Provided: Care of device  Donald Pore 06/24/2022, 4:01 PM

## 2022-06-24 NOTE — Discharge Instructions (Signed)
[facility] Discharge Instructions   General Discharge Instructions  WEIGHT BEARING STATUS:Touchdown weightbearing left lower extremity  RANGE OF MOTION/ACTIVITY: Ok for hip, knee, ankle range of motion as tolerated  Wound Care:You may remove your surgical incisions on post-op day #2 (Wednesday 06/25/22). Incisions can be left open to air if there is no drainage. If incision continues to have drainage, follow wound care instructions below. Okay to shower if no drainage from incisions.  DVT/PE prophylaxis: Aspirin 325 mg daily x 30 days  Diet: as you were eating previously.  Can use over the counter stool softeners and bowel preparations, such as Miralax, to help with bowel movements.  Narcotics can be constipating.  Be sure to drink plenty of fluids  PAIN MEDICATION USE AND EXPECTATIONS  You have likely been given narcotic medications to help control your pain.  After a traumatic event that results in an fracture (broken bone) with or without surgery, it is ok to use narcotic pain medications to help control one's pain.  We understand that everyone responds to pain differently and each individual patient will be evaluated on a regular basis for the continued need for narcotic medications. Ideally, narcotic medication use should last no more than 6-8 weeks (coinciding with fracture healing).   As a patient it is your responsibility as well to monitor narcotic medication use and report the amount and frequency you use these medications when you come to your office visit.   We would also advise that if you are using narcotic medications, you should take a dose prior to therapy to maximize you participation.  IF YOU ARE ON NARCOTIC MEDICATIONS IT IS NOT PERMISSIBLE TO OPERATE A MOTOR VEHICLE (MOTORCYCLE/CAR/TRUCK/MOPED) OR HEAVY MACHINERY DO NOT MIX NARCOTICS WITH OTHER CNS (CENTRAL NERVOUS SYSTEM) DEPRESSANTS SUCH AS ALCOHOL   STOP SMOKING OR USING NICOTINE PRODUCTS!!!!  As  discussed nicotine severely impairs your body's ability to heal surgical and traumatic wounds but also impairs bone healing.  Wounds and bone heal by forming microscopic blood vessels (angiogenesis) and nicotine is a vasoconstrictor (essentially, shrinks blood vessels).  Therefore, if vasoconstriction occurs to these microscopic blood vessels they essentially disappear and are unable to deliver necessary nutrients to the healing tissue.  This is one modifiable factor that you can do to dramatically increase your chances of healing your injury.    (This means no smoking, no nicotine gum, patches, etc)  DO NOT USE NONSTEROIDAL ANTI-INFLAMMATORY DRUGS (NSAID'S)  Using products such as Advil (ibuprofen), Aleve (naproxen), Motrin (ibuprofen) for additional pain control during fracture healing can delay and/or prevent the healing response.  If you would like to take over the counter (OTC) medication, Tylenol (acetaminophen) is ok.  However, some narcotic medications that are given for pain control contain acetaminophen as well. Therefore, you should not exceed more than 4000 mg of tylenol in a day if you do not have liver disease.  Also note that there are may OTC medicines, such as cold medicines and allergy medicines that my contain tylenol as well.  If you have any questions about medications and/or interactions please ask your doctor/PA or your pharmacist.      ICE AND ELEVATE INJURED/OPERATIVE EXTREMITY  Using ice and elevating the injured extremity above your heart can help with swelling and pain control.  Icing in a pulsatile fashion, such as 20 minutes on and 20 minutes off, can be followed.    Do not place ice directly on skin. Make sure there is a barrier between  to skin and the ice pack.    Using frozen items such as frozen peas works well as the conform nicely to the are that needs to be iced.  USE AN ACE WRAP OR TED HOSE FOR SWELLING CONTROL  In addition to icing and elevation, Ace wraps or TED  hose are used to help limit and resolve swelling.  It is recommended to use Ace wraps or TED hose until you are informed to stop.    When using Ace Wraps start the wrapping distally (farthest away from the body) and wrap proximally (closer to the body)   Example: If you had surgery on your leg or thing and you do not have a splint on, start the ace wrap at the toes and work your way up to the thigh        If you had surgery on your upper extremity and do not have a splint on, start the ace wrap at your fingers and work your way up to the upper arm   CALL THE OFFICE WITH ANY QUESTIONS OR CONCERNS: [phone]   VISIT OUR WEBSITE FOR ADDITIONAL INFORMATION: [email or web address]     Discharge Wound Care Instructions  Do NOT apply any ointments, solutions or lotions to pin sites or surgical wounds.  These prevent needed drainage and even though solutions like hydrogen peroxide kill bacteria, they also damage cells lining the pin sites that help fight infection.  Applying lotions or ointments can keep the wounds moist and can cause them to breakdown and open up as well. This can increase the risk for infection. When in doubt call the office.  Surgical incisions should be dressed daily.  If any drainage is noted, use one layer of adaptic or Mepitel, then gauze, Kerlix, and an ace wrap. - These dressing supplies should be available at local medical supply stores [facility], [employer], etc) as well as Insurance claims handler ([facility], [facility], [facility], etc)  Once the incision is completely dry and without drainage, it may be left open to air out.  Showering may begin 36-48 hours later.  Cleaning gently with soap and water.  Traumatic wounds should be dressed daily as well.    One layer of adaptic, gauze, Kerlix, then ace wrap.  The adaptic can be discontinued once the draining has ceased    If you have a wet to dry dressing: wet the gauze with saline the squeeze as much saline out so the gauze is  moist (not soaking wet), place moistened gauze over wound, then place a dry gauze over the moist one, followed by Kerlix wrap, then ace wrap.

## 2022-06-24 NOTE — Plan of Care (Signed)

## 2022-06-24 NOTE — Progress Notes (Signed)
Patient requesting to leave AMA. Educated patient that she will have to sign paperwork and will not receive any meds to go home with. Patient gave verbal understanding stated she is ready to go home. Patient is getting dressed. Thyra Breed, PA has been notified.
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
## 2022-06-24 NOTE — Progress Notes (Signed)
Orthopaedic Trauma Progress Note  SUBJECTIVE: Doing ok, pain controlled. No chest pain. No SOB. No nausea/vomiting. No other complaints. Was able to pivot to bedside commode this AM.   OBJECTIVE:  Vitals:   06/24/22 0003 06/24/22 0436  BP: 132/83 116/86  Pulse: 82 85  Resp: 17 17  Temp: 98.2 F (36.8 C) 98.2 F (36.8 C)  SpO2: 98% 100%    General: Up at bedside, NAD Respiratory: No increased work of breathing.  LLE: Dressing CDI. Ankle DF/PF intact but limited secondary to pain and swelling. Compartments swollen but compressible.  IMAGING: Stable post op imaging.   LABS:  Results for orders placed or performed during the hospital encounter of 06/22/22 (from the past 24 hour(s))  CBC     Status: Abnormal   Collection Time: 06/24/22  2:29 AM  Result Value Ref Range   WBC 7.7 4.0 - 10.5 K/uL   RBC 2.98 (L) 3.87 - 5.11 MIL/uL   Hemoglobin 9.6 (L) 12.0 - 15.0 g/dL   HCT 84.6 (L) 96.2 - 95.2 %   MCV 89.9 80.0 - 100.0 fL   MCH 32.2 26.0 - 34.0 pg   MCHC 35.8 30.0 - 36.0 g/dL   RDW 84.1 32.4 - 40.1 %   Platelets 130 (L) 150 - 400 K/uL   nRBC 0.0 0.0 - 0.2 %  Basic metabolic panel     Status: Abnormal   Collection Time: 06/24/22  2:29 AM  Result Value Ref Range   Sodium 137 135 - 145 mmol/L   Potassium 3.9 3.5 - 5.1 mmol/L   Chloride 106 98 - 111 mmol/L   CO2 22 22 - 32 mmol/L   Glucose, Bld 105 (H) 70 - 99 mg/dL   BUN 7 6 - 20 mg/dL   Creatinine, Ser 0.27 0.44 - 1.00 mg/dL   Calcium 8.3 (L) 8.9 - 10.3 mg/dL   GFR, Estimated >25 >36 mL/min   Anion gap 9 5 - 15    ASSESSMENT: Lori Pollard is a 31 y.o. female, 1 Day Post-Op s/p RETROGRADE IM NAIL LEFT FEMUR INTRAMEDULLARY NAIL LEFT TIBIA PERCUTANEOUS FIXATION LEFT FEMORAL NECK   CV/Blood loss: Acute blood loss anemia, Hgb 9.6 this morning. Hemodynamically stable  PLAN: Weightbearing: TDWB LLE ROM: Okay for hip, knee, ankle ROM as tolerated Incisional and dressing care: Reinforce dressings as needed.   Plan to remove/change dressing 06/25/2022 Showering: Okay to begin getting incisions wet 06/26/2022 if no drainage Orthopedic device(s): None  Pain management:  1. Tylenol 1000 mg q 6 hours scheduled 2. Robaxin 500 mg q 6 hours PRN 3. Oxycodone 5-15 mg q 4 hours PRN 4. Dilaudid 0.5-1 mg q 4 hours PRN VTE prophylaxis: Lovenox, SCDs ID:  Ancef 2gm post op Foley/Lines:  No foley, KVO IVFs Impediments to Fracture Healing: Vitamin D level 42, no additional supplementation needed Dispo: PT/OT evaluation today.  We will plan to change dressings tomorrow.  Discharge in next 24 hours if progresses well with therapies and pain remains well controlled   D/C recommendations: -Oxycodone and Robaxin for pain control -Aspirin 325 mg daily for DVT prophylaxis -No need for Vit D supplementation  Follow - up plan: 2 weeks   Contact information:  Truitt Merle MD, Thyra Breed PA-C. After hours and holidays please check [email or web address] for group call information for [facility]   Thompson Caul, PA-C [phone] (office) [email or web address]

## 2022-06-25 ENCOUNTER — Encounter (HOSPITAL_COMMUNITY): Payer: Self-pay | Admitting: Student

## 2022-06-25 ENCOUNTER — Other Ambulatory Visit: Payer: Self-pay | Admitting: Student
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
## 2022-07-06 ENCOUNTER — Emergency Department
Admission: EM | Admit: 2022-07-06 | Discharge: 2022-07-06 | Discharge status: Against Medical Advice | Payer: Medicaid Other

## 2022-07-06 NOTE — ED Notes (Signed)
Pt did not want to stay due to wait times.

## 2022-10-07 NOTE — Telephone Encounter (Signed)
Done

## 2022-12-26 ENCOUNTER — Emergency Department
Admission: EM | Admit: 2022-12-26 | Discharge: 2022-12-27 | Disposition: A | Discharge status: Other Acute Inpatient Hospital | Payer: Medicaid Other | Attending: Emergency Medicine | Admitting: Emergency Medicine

## 2022-12-26 ENCOUNTER — Other Ambulatory Visit: Payer: Self-pay

## 2022-12-26 DIAGNOSIS — D649 Anemia, unspecified: Secondary | ICD-10-CM | POA: Insufficient documentation

## 2022-12-26 DIAGNOSIS — S41101A Unspecified open wound of right upper arm, initial encounter: Secondary | ICD-10-CM | POA: Insufficient documentation

## 2022-12-26 DIAGNOSIS — Z23 Encounter for immunization: Secondary | ICD-10-CM | POA: Insufficient documentation

## 2022-12-26 DIAGNOSIS — F332 Major depressive disorder, recurrent severe without psychotic features: Secondary | ICD-10-CM | POA: Diagnosis present

## 2022-12-26 DIAGNOSIS — Z1152 Encounter for screening for COVID-19: Secondary | ICD-10-CM | POA: Insufficient documentation

## 2022-12-26 DIAGNOSIS — X58XXXA Exposure to other specified factors, initial encounter: Secondary | ICD-10-CM | POA: Insufficient documentation

## 2022-12-26 DIAGNOSIS — E876 Hypokalemia: Secondary | ICD-10-CM | POA: Insufficient documentation

## 2022-12-26 DIAGNOSIS — F17210 Nicotine dependence, cigarettes, uncomplicated: Secondary | ICD-10-CM | POA: Insufficient documentation

## 2022-12-26 LAB — CBC WITH DIFFERENTIAL/PLATELET
Abs Immature Granulocytes: 0.05 10*3/uL (ref 0.00–0.07)
Basophils Absolute: 0 10*3/uL (ref 0.0–0.1)
Basophils Relative: 0 %
Eosinophils Absolute: 0.1 10*3/uL (ref 0.0–0.5)
Eosinophils Relative: 1 %
HCT: 25.8 % — ABNORMAL LOW (ref 36.0–46.0)
Hemoglobin: 8.6 g/dL — ABNORMAL LOW (ref 12.0–15.0)
Immature Granulocytes: 1 %
Lymphocytes Relative: 15 %
Lymphs Abs: 1.3 10*3/uL (ref 0.7–4.0)
MCH: 30 pg (ref 26.0–34.0)
MCHC: 33.3 g/dL (ref 30.0–36.0)
MCV: 89.9 fL (ref 80.0–100.0)
Monocytes Absolute: 0.8 10*3/uL (ref 0.1–1.0)
Monocytes Relative: 10 %
Neutro Abs: 6.3 10*3/uL (ref 1.7–7.7)
Neutrophils Relative %: 73 %
Platelets: 342 10*3/uL (ref 150–400)
RBC: 2.87 MIL/uL — ABNORMAL LOW (ref 3.87–5.11)
RDW: 13.4 % (ref 11.5–15.5)
WBC: 8.6 10*3/uL (ref 4.0–10.5)
nRBC: 0 % (ref 0.0–0.2)

## 2022-12-26 LAB — COMPREHENSIVE METABOLIC PANEL
ALT: 15 U/L (ref 0–44)
AST: 26 U/L (ref 15–41)
Albumin: 3.3 g/dL — ABNORMAL LOW (ref 3.5–5.0)
Alkaline Phosphatase: 77 U/L (ref 38–126)
Anion gap: 15 (ref 5–15)
BUN: 7 mg/dL (ref 6–20)
CO2: 26 mmol/L (ref 22–32)
Calcium: 8.7 mg/dL — ABNORMAL LOW (ref 8.9–10.3)
Chloride: 94 mmol/L — ABNORMAL LOW (ref 98–111)
Creatinine, Ser: 0.7 mg/dL (ref 0.44–1.00)
GFR, Estimated: >60 mL/min (ref >60)
Glucose, Bld: 105 mg/dL — ABNORMAL HIGH (ref 70–99)
Potassium: 2.5 mmol/L — CL (ref 3.5–5.1)
Sodium: 135 mmol/L (ref 135–145)
Total Bilirubin: 1.2 mg/dL (ref 0.3–1.2)
Total Protein: 7.2 g/dL (ref 6.5–8.1)

## 2022-12-26 LAB — MAGNESIUM: Magnesium: 1.9 mg/dL (ref 1.7–2.4)

## 2022-12-26 LAB — RESP PANEL BY RT-PCR (RSV, FLU A&B, COVID)  RVPGX2
Influenza A by PCR: NEGATIVE
Influenza B by PCR: NEGATIVE
Resp Syncytial Virus by PCR: NEGATIVE
SARS Coronavirus 2 by RT PCR: NEGATIVE

## 2022-12-26 LAB — SALICYLATE LEVEL: Salicylate Lvl: <7 mg/dL — ABNORMAL LOW (ref 7.0–30.0)

## 2022-12-26 LAB — ACETAMINOPHEN LEVEL: Acetaminophen (Tylenol), Serum: <10 ug/mL — ABNORMAL LOW (ref 10–30)

## 2022-12-26 LAB — ETHANOL: Alcohol, Ethyl (B): <10 mg/dL (ref <10)

## 2022-12-26 MED ORDER — TETANUS-DIPHTH-ACELL PERTUSSIS 5-2.5-18.5 LF-MCG/0.5 IM SUSY
0.5000 mL | PREFILLED_SYRINGE | Freq: Once | INTRAMUSCULAR | Status: AC
  Administered 2022-12-26: 0.5 mL via INTRAMUSCULAR
  Filled 2022-12-26: qty 0.5

## 2022-12-26 MED ORDER — GABAPENTIN 300 MG PO CAPS
300.0000 mg | ORAL_CAPSULE | Freq: Three times a day (TID) | ORAL | Status: DC
  Administered 2022-12-26 (×2): 300 mg via ORAL
  Filled 2022-12-26 (×3): qty 1

## 2022-12-26 MED ORDER — QUETIAPINE FUMARATE 25 MG PO TABS
50.0000 mg | ORAL_TABLET | Freq: Every day | ORAL | Status: DC
  Administered 2022-12-26: 50 mg via ORAL
  Filled 2022-12-26: qty 2

## 2022-12-26 MED ORDER — POTASSIUM CHLORIDE CRYS ER 20 MEQ PO TBCR
40.0000 meq | EXTENDED_RELEASE_TABLET | Freq: Once | ORAL | Status: AC
  Administered 2022-12-26: 40 meq via ORAL
  Filled 2022-12-26: qty 2

## 2022-12-26 MED ORDER — CEPHALEXIN 500 MG PO CAPS
500.0000 mg | ORAL_CAPSULE | Freq: Four times a day (QID) | ORAL | Status: DC
  Administered 2022-12-27: 500 mg via ORAL
  Filled 2022-12-26 (×2): qty 1

## 2022-12-26 MED ORDER — CEPHALEXIN 500 MG PO CAPS
500.0000 mg | ORAL_CAPSULE | Freq: Once | ORAL | Status: AC
  Administered 2022-12-26: 500 mg via ORAL
  Filled 2022-12-26: qty 1

## 2022-12-26 MED ORDER — POTASSIUM CHLORIDE 10 MEQ/100ML IV SOLN
10.0000 meq | INTRAVENOUS | Status: AC
  Administered 2022-12-26 (×2): 10 meq via INTRAVENOUS
  Filled 2022-12-26 (×2): qty 100

## 2022-12-26 MED ORDER — BACITRACIN ZINC 500 UNIT/GM EX OINT
TOPICAL_OINTMENT | Freq: Two times a day (BID) | CUTANEOUS | Status: DC
  Administered 2022-12-26: 1 via TOPICAL
  Filled 2022-12-26 (×2): qty 0.9

## 2022-12-26 NOTE — ED Notes (Signed)
Pt eating dinner tray. Pt given phone to talk to husband at this time.

## 2022-12-26 NOTE — ED Notes (Signed)
Right arm dressed with non-adherent (petroleum infused) strips and guaze wrap per MD instructions.

## 2022-12-26 NOTE — ED Notes (Addendum)
Right forearm irrigated with normal saline at this time. Lab drawing blood.

## 2022-12-26 NOTE — BH Assessment (Addendum)
Per [place], patient to be reviewed tomorrow (12/27/22)

## 2022-12-26 NOTE — ED Notes (Addendum)
Pt changed into paper scubs with hospital socks and brief. Pt given warm blanket.  Belongings: PJ pants with purple print  T-shirt Grey underwear  Lace up shoes.

## 2022-12-26 NOTE — ED Notes (Signed)
This rn went into room to give oral medication. pt very sleepy and not staying awake to take pill. Ward DO aware and says it is okay to wait
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
## 2022-12-26 NOTE — ED Notes (Signed)
VOL  CONSULT  DONE  PENDING  PLACEMENT

## 2022-12-26 NOTE — ED Triage Notes (Signed)
Pt from home via EMS- states she got out of jail this month and due to difficult life circumstances self-harmed today with tweezers. Pt states she normally just uses nails to scratch skin but used tweezers this time, pt admits to crack cocaine use today, denies other drug/ETOH use. PT denies auditory/visual hallucinations, denies SI/HI. Significant open abrasions/cuts/scabs noted right forearm, pt reports tenderness. Redness and mild swelling noted, bleeding is controlled without pressure.

## 2022-12-26 NOTE — ED Notes (Signed)
Lab called to collect blood- pt is a hard stick w/ limited collection sites due to injury.

## 2022-12-26 NOTE — ED Notes (Signed)
Old dressing taken off and bacitracin placed on wound. Non adherent dressing placed on wound and wrapped in gauze
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
## 2022-12-26 NOTE — ED Notes (Signed)
Pt given urine cup and instructed on use.

## 2022-12-26 NOTE — ED Notes (Signed)
Pt given nighttime snack. 

## 2022-12-27 ENCOUNTER — Inpatient Hospital Stay
Admission: RE | Admit: 2022-12-27 | Discharge: 2023-01-01 | DRG: 885 | Disposition: A | Discharge status: Home / Self Care | Payer: 59 | Source: Intra-hospital | Attending: Psychiatry | Admitting: Psychiatry

## 2022-12-27 ENCOUNTER — Encounter: Payer: Self-pay | Admitting: Psychiatry

## 2022-12-27 DIAGNOSIS — F411 Generalized anxiety disorder: Secondary | ICD-10-CM | POA: Diagnosis present

## 2022-12-27 DIAGNOSIS — D638 Anemia in other chronic diseases classified elsewhere: Secondary | ICD-10-CM | POA: Insufficient documentation

## 2022-12-27 DIAGNOSIS — F4310 Post-traumatic stress disorder, unspecified: Secondary | ICD-10-CM | POA: Diagnosis present

## 2022-12-27 DIAGNOSIS — F332 Major depressive disorder, recurrent severe without psychotic features: Principal | ICD-10-CM | POA: Diagnosis present

## 2022-12-27 DIAGNOSIS — Z9152 Personal history of nonsuicidal self-harm: Secondary | ICD-10-CM

## 2022-12-27 DIAGNOSIS — D509 Iron deficiency anemia, unspecified: Secondary | ICD-10-CM | POA: Diagnosis present

## 2022-12-27 DIAGNOSIS — G4700 Insomnia, unspecified: Secondary | ICD-10-CM | POA: Diagnosis present

## 2022-12-27 DIAGNOSIS — Z9151 Personal history of suicidal behavior: Secondary | ICD-10-CM

## 2022-12-27 DIAGNOSIS — S61519A Laceration without foreign body of unspecified wrist, initial encounter: Secondary | ICD-10-CM | POA: Insufficient documentation

## 2022-12-27 DIAGNOSIS — F1410 Cocaine abuse, uncomplicated: Secondary | ICD-10-CM | POA: Diagnosis present

## 2022-12-27 DIAGNOSIS — Z87891 Personal history of nicotine dependence: Secondary | ICD-10-CM

## 2022-12-27 DIAGNOSIS — X789XXA Intentional self-harm by unspecified sharp object, initial encounter: Secondary | ICD-10-CM | POA: Insufficient documentation

## 2022-12-27 DIAGNOSIS — Z818 Family history of other mental and behavioral disorders: Secondary | ICD-10-CM

## 2022-12-27 LAB — URINE DRUG SCREEN, QUALITATIVE (ARMC ONLY)
Amphetamines, Ur Screen: NOT DETECTED
Barbiturates, Ur Screen: NOT DETECTED
Benzodiazepine, Ur Scrn: NOT DETECTED
Cannabinoid 50 Ng, Ur ~~LOC~~: NOT DETECTED
Cocaine Metabolite,Ur ~~LOC~~: POSITIVE — AB
MDMA (Ecstasy)Ur Screen: NOT DETECTED
Methadone Scn, Ur: NOT DETECTED
Opiate, Ur Screen: NOT DETECTED
Phencyclidine (PCP) Ur S: NOT DETECTED
Tricyclic, Ur Screen: NOT DETECTED

## 2022-12-27 LAB — POTASSIUM
Potassium: 2.5 mmol/L — CL (ref 3.5–5.1)
Potassium: 3.5 mmol/L (ref 3.5–5.1)

## 2022-12-27 LAB — PREGNANCY, URINE: Preg Test, Ur: NEGATIVE

## 2022-12-27 MED ORDER — PHENYLEPHRINE-MINERAL OIL-PET 0.25-14-74.9 % RE OINT: 1.0000 | TOPICAL_OINTMENT | Freq: Two times a day (BID) | RECTAL | Status: DC | PRN

## 2022-12-27 MED ORDER — DESOGESTREL-ETHINYL ESTRADIOL 0.15-0.02/0.01 MG (21/5) PO TABS: 1.0000 | ORAL_TABLET | Freq: Every day | ORAL | Status: DC

## 2022-12-27 MED ORDER — IBUPROFEN 200 MG PO TABS: 400.0000 mg | ORAL_TABLET | Freq: Four times a day (QID) | ORAL | Status: DC | PRN

## 2022-12-27 MED ORDER — ALUM & MAG HYDROXIDE-SIMETH 200-200-20 MG/5ML PO SUSP: 30.0000 mL | ORAL | Status: DC | PRN

## 2022-12-27 MED ORDER — GABAPENTIN 300 MG PO CAPS
300.0000 mg | ORAL_CAPSULE | Freq: Three times a day (TID) | ORAL | Status: DC
  Administered 2022-12-27 – 2023-01-01 (×15): 300 mg via ORAL
  Filled 2022-12-27 (×15): qty 1

## 2022-12-27 MED ORDER — FERROUS SULFATE 325 (65 FE) MG PO TABS
325.0000 mg | ORAL_TABLET | Freq: Every day | ORAL | Status: DC
  Administered 2022-12-27: 325 mg via ORAL
  Filled 2022-12-27: qty 1

## 2022-12-27 MED ORDER — MAGNESIUM HYDROXIDE 400 MG/5ML PO SUSP: 30.0000 mL | Freq: Every day | ORAL | Status: DC | PRN

## 2022-12-27 MED ORDER — POTASSIUM CHLORIDE CRYS ER 20 MEQ PO TBCR
40.0000 meq | EXTENDED_RELEASE_TABLET | Freq: Once | ORAL | Status: AC
  Administered 2022-12-27: 40 meq via ORAL
  Filled 2022-12-27: qty 2

## 2022-12-27 MED ORDER — DIPHENHYDRAMINE HCL 50 MG/ML IJ SOLN: 50.0000 mg | Freq: Once | INTRAMUSCULAR | Status: DC | PRN

## 2022-12-27 MED ORDER — ACETAMINOPHEN 325 MG PO TABS
650.0000 mg | ORAL_TABLET | Freq: Four times a day (QID) | ORAL | Status: DC | PRN
  Administered 2022-12-28 – 2022-12-31 (×6): 650 mg via ORAL
  Filled 2022-12-27 (×6): qty 2

## 2022-12-27 MED ORDER — OLANZAPINE 10 MG IM SOLR: 10.0000 mg | Freq: Once | INTRAMUSCULAR | Status: DC | PRN

## 2022-12-27 MED ORDER — FERROUS SULFATE 325 (65 FE) MG PO TABS
325.0000 mg | ORAL_TABLET | Freq: Every day | ORAL | Status: DC
  Administered 2022-12-28 – 2023-01-01 (×5): 325 mg via ORAL
  Filled 2022-12-27 (×5): qty 1

## 2022-12-27 MED ORDER — POTASSIUM CHLORIDE 10 MEQ/100ML IV SOLN
10.0000 meq | INTRAVENOUS | Status: AC
  Administered 2022-12-27 (×2): 10 meq via INTRAVENOUS
  Filled 2022-12-27 (×2): qty 100

## 2022-12-27 MED ORDER — QUETIAPINE FUMARATE 25 MG PO TABS
50.0000 mg | ORAL_TABLET | Freq: Every day | ORAL | Status: DC
  Administered 2022-12-28 – 2022-12-31 (×4): 50 mg via ORAL
  Filled 2022-12-27 (×6): qty 2

## 2022-12-27 MED ORDER — CEPHALEXIN 500 MG PO CAPS
500.0000 mg | ORAL_CAPSULE | Freq: Four times a day (QID) | ORAL | Status: AC
  Administered 2022-12-27 (×2): 500 mg via ORAL
  Filled 2022-12-27 (×3): qty 1

## 2022-12-27 NOTE — ED Notes (Signed)
Pt given breakfast.

## 2022-12-27 NOTE — Consult Note (Signed)
[facility] Consult   Reason for Consult:  Psych evaluation Referring Physician:  Rada Hay, MD Patient Identification: Lori Pollard MRN:  [id number] Principal Diagnosis: Major depressive disorder, recurrent severe without psychotic features ([place]) Diagnosis:  Principal Problem:   Major depressive disorder, recurrent severe without psychotic features ([place])   Total Time spent with patient: 45 minutes  Subjective:   Lori Pollard is a 32 y.o. female patient with a history of polysubstance use disorder, PTSD, postpartum depression, and anxiety who came to the ED via EMS with concerns for self-harm.  Client has been sleeping all morning, her wounds have been treated and her labs/EKG are stable.  She is ready for inpatient admission.  HPI on admission:  Pt found sitting up in bed, alert and cooperative. Stated she got out of prison on Feb. 15 and her fiance is still in prison. Reported she recently lost custody of her children and was sleeping in their old bedroom when she became emotionally unstable and scratched her right arm and face. Her right arm and face look burned and are severe, seriously doubt this was scratching behaviors. Based on her stressors and substance abuse along with severe self-harm, client is recommended for inpatient hospitalization.  She minimizes everything with inconsistent history for the severity of her wounds.  Past Psychiatric History: bipolar d/o, substance abuse  Risk to Self:  yes Risk to Others:  none Prior Inpatient Therapy: Yes, per pt  Prior Outpatient Therapy:  unknown  Past Medical History:  Past Medical History:  Diagnosis Date   Anemia    Anxiety    Cervicalgia    Depression    Hepatitis    History of chickenpox    Hives    Mental disorder    Polysubstance abuse ([place])    Preterm labor    Vaginal Pap smear, abnormal     Past Surgical History:  Procedure Laterality Date   CESAREAN SECTION   2013   CESAREAN SECTION  2015   CESAREAN SECTION N/A 11/01/2019   Procedure: CESAREAN SECTION;  Surgeon: Homero Fellers, MD;  Location: [facility];  Service: Obstetrics;  Laterality: N/A;   DILATION AND EVACUATION N/A 03/18/2022   Procedure: DILATATION AND EVACUATION WITH JADA PLACEMENT;  Surgeon: Harlin Heys, MD;  Location: [facility];  Service: Gynecology;  Laterality: N/A;   FEMUR IM NAIL Left 06/23/2022   Procedure: INTRAMEDULLARY (IM) RETROGRADE FEMORAL NAILING;  Surgeon: Shona Needles, MD;  Location: [facility];  Service: Orthopedics;  Laterality: Left;   INCISION AND DRAINAGE OF WOUND Right 06/25/2020   Procedure: IRRIGATION AND DEBRIDEMENT WOUND-Right Leg;  Surgeon: Leim Fabry, MD;  Location: [facility];  Service: Orthopedics;  Laterality: Right;   TIBIA IM NAIL INSERTION Left 06/23/2022   Procedure: INTRAMEDULLARY (IM) NAIL TIBIAL;  Surgeon: Shona Needles, MD;  Location: [facility];  Service: Orthopedics;  Laterality: Left;   Family History:  Family History  Problem Relation Age of Onset   Alcohol abuse Mother    GER disease Mother    Depression Mother    Alcohol abuse Father    Emphysema Father    Cancer Maternal Grandmother    Diabetes Maternal Grandmother    Heart disease Maternal Grandmother    Hyperlipidemia Maternal Grandmother    Hypertension Maternal Grandmother    Kidney disease Maternal Grandmother    Lung cancer Maternal Grandmother    Family Psychiatric  History: see above Social History:  Social History   Substance and Sexual  Activity  Alcohol Use No   Alcohol/week: 0.0 standard drinks of alcohol     Social History   Substance and Sexual Activity  Drug Use Not Currently   Types: Cocaine, Methamphetamines, Heroin   Comment: last cocaine use was 2 days ago (12/15/21), pt currently takes suboxone    Social History   Socioeconomic History   Marital status: Single    Spouse name: Not on file   Number of children: 3   Years of education: 9   Highest  education level: GED or equivalent  Occupational History   Not on file  Tobacco Use   Smoking status: Former    Packs/day: 0.50    Years: 11.00    Total pack years: 5.50    Types: Cigarettes    Start date: 09/17/2004    Passive exposure: Past   Smokeless tobacco: Never  Vaping Use   Vaping Use: Never used  Substance and Sexual Activity   Alcohol use: No    Alcohol/week: 0.0 standard drinks of alcohol   Drug use: Not Currently    Types: Cocaine, Methamphetamines, Heroin    Comment: last cocaine use was 2 days ago (12/15/21), pt currently takes suboxone   Sexual activity: Not Currently    Partners: Male    Birth control/protection: OCP, Surgical    Comment: BTL  Other Topics Concern   Not on file  Social History Narrative   Not on file   Social Determinants of Health   Financial Resource Strain: Medium Risk (09/09/2021)   Overall Financial Resource Strain (CARDIA)    Difficulty of Paying Living Expenses: Somewhat hard  Food Insecurity: Food Insecurity Present (09/09/2021)   Hunger Vital Sign    Worried About Running Out of Food in the Last Year: Sometimes true    Ran Out of Food in the Last Year: Sometimes true  Transportation Needs: No Transportation Needs (09/09/2021)   PRAPARE - Hydrologist (Medical): No    Lack of Transportation (Non-Medical): No  Physical Activity: Not on file  Stress: Not on file  Social Connections: Not on file   Additional Social History:    Allergies:  No Known Allergies  Labs:  Results for orders placed or performed during the hospital encounter of 12/26/22 (from the past 48 hour(s))  Comprehensive metabolic panel     Status: Abnormal   Collection Time: 12/26/22  5:34 PM  Result Value Ref Range   Sodium 135 135 - 145 mmol/L   Potassium 2.5 (LL) 3.5 - 5.1 mmol/L    Comment: CRITICAL RESULT CALLED TO, READ BACK BY AND VERIFIED WITH HEATHER FISHER @1822$  ON 12/26/22 SKL    Chloride 94 (L) 98 - 111 mmol/L   CO2  26 22 - 32 mmol/L   Glucose, Bld 105 (H) 70 - 99 mg/dL    Comment: Glucose reference range applies only to samples taken after fasting for at least 8 hours.   BUN 7 6 - 20 mg/dL   Creatinine, Ser 0.70 0.44 - 1.00 mg/dL   Calcium 8.7 (L) 8.9 - 10.3 mg/dL   Total Protein 7.2 6.5 - 8.1 g/dL   Albumin 3.3 (L) 3.5 - 5.0 g/dL   AST 26 15 - 41 U/L   ALT 15 0 - 44 U/L   Alkaline Phosphatase 77 38 - 126 U/L   Total Bilirubin 1.2 0.3 - 1.2 mg/dL   GFR, Estimated >60 >60 mL/min    Comment: (NOTE) Calculated using the CKD-EPI Creatinine Equation (  2021)    Anion gap 15 5 - 15    Comment: Performed at [facility], [address] [place], [place] [address]  CBC with Differential     Status: Abnormal   Collection Time: 12/26/22  5:34 PM  Result Value Ref Range   WBC 8.6 4.0 - 10.5 K/uL   RBC 2.87 (L) 3.87 - 5.11 MIL/uL   Hemoglobin 8.6 (L) 12.0 - 15.0 g/dL   HCT 25.8 (L) 36.0 - 46.0 %   MCV 89.9 80.0 - 100.0 fL   MCH 30.0 26.0 - 34.0 pg   MCHC 33.3 30.0 - 36.0 g/dL   RDW 13.4 11.5 - 15.5 %   Platelets 342 150 - 400 K/uL   nRBC 0.0 0.0 - 0.2 %   Neutrophils Relative % 73 %   Neutro Abs 6.3 1.7 - 7.7 K/uL   Lymphocytes Relative 15 %   Lymphs Abs 1.3 0.7 - 4.0 K/uL   Monocytes Relative 10 %   Monocytes Absolute 0.8 0.1 - 1.0 K/uL   Eosinophils Relative 1 %   Eosinophils Absolute 0.1 0.0 - 0.5 K/uL   Basophils Relative 0 %   Basophils Absolute 0.0 0.0 - 0.1 K/uL   Immature Granulocytes 1 %   Abs Immature Granulocytes 0.05 0.00 - 0.07 K/uL    Comment: Performed at [facility], [address] [place], [place] [address]  Ethanol     Status: None   Collection Time: 12/26/22  5:34 PM  Result Value Ref Range   Alcohol, Ethyl (B) <10 <10 mg/dL    Comment: (NOTE) Lowest detectable limit for serum alcohol is 10 mg/dL.  For medical purposes only. Performed at [facility], [address] [place], [place] [address]   Acetaminophen level     Status: Abnormal    Collection Time: 12/26/22  5:34 PM  Result Value Ref Range   Acetaminophen (Tylenol), Serum <10 (L) 10 - 30 ug/mL    Comment: (NOTE) Therapeutic concentrations vary significantly. A range of 10-30 ug/mL  may be an effective concentration for many patients. However, some  are best treated at concentrations outside of this range. Acetaminophen concentrations >150 ug/mL at 4 hours after ingestion  and >50 ug/mL at 12 hours after ingestion are often associated with  toxic reactions.  Performed at [facility] [address] [place], [place] [id number]   Salicylate level     Status: Abnormal   Collection Time: 12/26/22  5:34 PM  Result Value Ref Range   Salicylate Lvl [id number] (L) 7.0 - 30.0 mg/dL    Comment: Performed at [facility], [address] [place], [place] [address]  Magnesium     Status: None   Collection Time: 12/26/22  5:34 PM  Result Value Ref Range   Magnesium 1.9 1.7 - 2.4 mg/dL    Comment: Performed at [facility], [address] [place], [place] [address]  Resp panel by RT-PCR (RSV, Flu A&B, Covid) Anterior Nasal Swab     Status: None   Collection Time: 12/26/22 10:19 PM   Specimen: Anterior Nasal Swab  Result Value Ref Range   SARS Coronavirus 2 by RT PCR NEGATIVE NEGATIVE    Comment: (NOTE) SARS-CoV-2 target nucleic acids are NOT DETECTED.  The SARS-CoV-2 RNA is generally detectable in upper respiratory specimens during the acute phase of infection. The lowest concentration of SARS-CoV-2 viral copies this assay can detect is 138 copies/mL. A negative result does not preclude SARS-Cov-2 infection and  should not be used as the sole basis for treatment or other patient management decisions. A negative result may occur with  improper specimen collection/handling, submission of specimen other than nasopharyngeal swab, presence of viral mutation(s) within the areas targeted by this assay, and inadequate number of viral copies(<138  copies/mL). A negative result must be combined with clinical observations, patient history, and epidemiological information. The expected result is Negative.  Fact Sheet for Patients:  EntrepreneurPulse.com.au  Fact Sheet for Healthcare Providers:  IncredibleEmployment.be  This test is no t yet approved or cleared by the [place] FDA and  has been authorized for detection and/or diagnosis of SARS-CoV-2 by FDA under an Emergency Use Authorization (EUA). This EUA will remain  in effect (meaning this test can be used) for the duration of the COVID-19 declaration under Section 564(b)(1) of the Act, 21 U.S.C.section 360bbb-3(b)(1), unless the authorization is terminated  or revoked sooner.       Influenza A by PCR NEGATIVE NEGATIVE   Influenza B by PCR NEGATIVE NEGATIVE    Comment: (NOTE) The Xpert Xpress SARS-CoV-2/FLU/RSV plus assay is intended as an aid in the diagnosis of influenza from Nasopharyngeal swab specimens and should not be used as a sole basis for treatment. Nasal washings and aspirates are unacceptable for Xpert Xpress SARS-CoV-2/FLU/RSV testing.  Fact Sheet for Patients: EntrepreneurPulse.com.au  Fact Sheet for Healthcare Providers: IncredibleEmployment.be  This test is not yet approved or cleared by the [place] FDA and has been authorized for detection and/or diagnosis of SARS-CoV-2 by FDA under an Emergency Use Authorization (EUA). This EUA will remain in effect (meaning this test can be used) for the duration of the COVID-19 declaration under Section 564(b)(1) of the Act, 21 U.S.C. section 360bbb-3(b)(1), unless the authorization is terminated or revoked.     Resp Syncytial Virus by PCR NEGATIVE NEGATIVE    Comment: (NOTE) Fact Sheet for Patients: EntrepreneurPulse.com.au  Fact Sheet for Healthcare  Providers: IncredibleEmployment.be  This test is not yet approved or cleared by the [place] FDA and has been authorized for detection and/or diagnosis of SARS-CoV-2 by FDA under an Emergency Use Authorization (EUA). This EUA will remain in effect (meaning this test can be used) for the duration of the COVID-19 declaration under Section 564(b)(1) of the Act, 21 U.S.C. section 360bbb-3(b)(1), unless the authorization is terminated or revoked.  Performed at [facility], [address] [place], [place] [address]   Potassium     Status: Abnormal   Collection Time: 12/27/22  4:18 AM  Result Value Ref Range   Potassium 2.5 (LL) 3.5 - 5.1 mmol/L    Comment: CRITICAL RESULT CALLED TO, READ BACK BY AND VERIFIED WITH [facility] MARTINEZ AT [id number] 12/27/2022 DLB Performed at [facility], [address] [place], [place] [address]   Urine Drug Screen, Qualitative ([place] only)     Status: Abnormal   Collection Time: 12/27/22  6:00 AM  Result Value Ref Range   Tricyclic, Ur Screen NONE DETECTED NONE DETECTED   Amphetamines, Ur Screen NONE DETECTED NONE DETECTED   MDMA (Ecstasy)Ur Screen NONE DETECTED NONE DETECTED   Cocaine Metabolite,Ur Scissors POSITIVE (A) NONE DETECTED   Opiate, Ur Screen NONE DETECTED NONE DETECTED   Phencyclidine (PCP) Ur S NONE DETECTED NONE DETECTED   Cannabinoid 50 Ng, Ur [place] NONE DETECTED NONE DETECTED   Barbiturates, Ur Screen NONE DETECTED NONE DETECTED   Benzodiazepine, Ur Scrn NONE DETECTED NONE DETECTED   Methadone Scn, Ur NONE DETECTED NONE DETECTED  Comment: (NOTE) Tricyclics + metabolites, urine    Cutoff 1000 ng/mL Amphetamines + metabolites, urine  Cutoff 1000 ng/mL MDMA (Ecstasy), urine              Cutoff 500 ng/mL Cocaine Metabolite, urine          Cutoff 300 ng/mL Opiate + metabolites, urine        Cutoff 300 ng/mL Phencyclidine (PCP), urine         Cutoff 25 ng/mL Cannabinoid, urine                 Cutoff 50  ng/mL Barbiturates + metabolites, urine  Cutoff 200 ng/mL Benzodiazepine, urine              Cutoff 200 ng/mL Methadone, urine                   Cutoff 300 ng/mL  The urine drug screen provides only a preliminary, unconfirmed analytical test result and should not be used for non-medical purposes. Clinical consideration and professional judgment should be applied to any positive drug screen result due to possible interfering substances. A more specific alternate chemical method must be used in order to obtain a confirmed analytical result. Gas chromatography / mass spectrometry (GC/MS) is the preferred confirm atory method. Performed at [facility], [address] [place], [place] [address]   Potassium     Status: None   Collection Time: 12/27/22  9:25 AM  Result Value Ref Range   Potassium 3.5 3.5 - 5.1 mmol/L    Comment: Performed at [facility], [address] [place], [place] [address]    Current Facility-Administered Medications  Medication Dose Route Frequency Provider Last Rate Last Admin   bacitracin ointment   Topical BID Rada Hay, MD   Given at 12/27/22 0949   cephALEXin (KEFLEX) capsule 500 mg  500 mg Oral Q6H Rada Hay, MD   500 mg at 12/27/22 [id number]   ferrous sulfate tablet 325 mg  325 mg Oral Q breakfast Ward, Kristen N, DO   325 mg at 12/27/22 0845   gabapentin (NEURONTIN) capsule 300 mg  300 mg Oral TID [name], NP   300 mg at 12/26/22 2155   QUEtiapine (SEROQUEL) tablet 50 mg  50 mg Oral QHS [name], NP   50 mg at 12/26/22 2155   Current Outpatient Medications  Medication Sig Dispense Refill   atomoxetine (STRATTERA) 25 MG capsule Take 25 mg by mouth every morning.     busPIRone (BUSPAR) 15 MG tablet Take 15 mg by mouth 2 (two) times daily. (Patient not taking: Reported on 06/23/2022)     desogestrel-ethinyl estradiol (MIRCETTE) 0.15-0.02/0.01 MG (21/5) tablet Take 1 tablet by mouth at bedtime. 84 tablet 0    ferrous sulfate 325 (65 FE) MG tablet Take 325 mg by mouth daily with breakfast.     ibuprofen (ADVIL) 200 MG tablet Take 400 mg by mouth every 6 (six) hours as needed for mild pain.     MAVYRET 100-40 MG TABS Take 3 tablets every day by oral route.     methocarbamol (ROBAXIN) 500 MG tablet Take 1 tablet (500 mg total) by mouth every 6 (six) hours as needed for muscle spasms. 28 tablet 0    Musculoskeletal: Strength & Muscle Tone: within normal limits Gait & Station: normal Patient leans: N/A  Psychiatric Specialty Exam: Physical Exam Vitals and nursing note reviewed.  Constitutional:      Appearance: Normal  appearance.  HENT:     Head: Normocephalic.     Nose: Nose normal.  Pulmonary:     Effort: Pulmonary effort is normal.  Musculoskeletal:        General: Normal range of motion.  Neurological:     General: No focal deficit present.     Mental Status: She is alert and oriented to person, place, and time.  Psychiatric:        Attention and Perception: She is inattentive.        Mood and Affect: Mood is anxious and depressed.        Speech: Speech normal.        Behavior: Behavior normal. Behavior is cooperative.        Thought Content: Thought content includes suicidal ideation.        Cognition and Memory: Cognition and memory normal.        Judgment: Judgment is impulsive.     Review of Systems  Psychiatric/Behavioral:  Positive for depression, substance abuse and suicidal ideas. The patient is nervous/anxious.   All other systems reviewed and are negative.   Blood pressure 102/88, pulse 89, temperature 98.4 F (36.9 C), temperature source Oral, resp. rate (!) 21, last menstrual period 12/17/2022, SpO2 100 %, unknown if currently breastfeeding.There is no height or weight on file to calculate BMI.  General Appearance: Disheveled  Eye Contact:  Fair  Speech:  Normal Rate  Volume:  Normal  Mood:  Anxious and Depressed  Affect:  Congruent  Thought Process:  Coherent   Orientation:  Full (Time, Place, and Person)  Thought Content:  Rumination  Suicidal Thoughts:  Yes.  with intent/plan  Homicidal Thoughts:  No  Memory:  Immediate;   Fair  Judgement:  Poor  Insight:  Lacking  Psychomotor Activity:  Decreased  Concentration:  Concentration: Fair and Attention Span: Fair  Recall:  [employer] of Knowledge:  Fair  Language:  Fair  Akathisia:  No  Handed:  Right  AIMS (if indicated):     Assets:  Leisure Time Physical Health Resilience  ADL's:  Intact  Cognition:  WNL  Sleep:        Physical Exam: Physical Exam Vitals and nursing note reviewed.  Constitutional:      Appearance: Normal appearance.  HENT:     Head: Normocephalic.     Nose: Nose normal.  Pulmonary:     Effort: Pulmonary effort is normal.  Musculoskeletal:        General: Normal range of motion.  Neurological:     General: No focal deficit present.     Mental Status: She is alert and oriented to person, place, and time.  Psychiatric:        Attention and Perception: She is inattentive.        Mood and Affect: Mood is anxious and depressed.        Speech: Speech normal.        Behavior: Behavior normal. Behavior is cooperative.        Thought Content: Thought content includes suicidal ideation.        Cognition and Memory: Cognition and memory normal.        Judgment: Judgment is impulsive.    Review of Systems  Psychiatric/Behavioral:  Positive for depression, substance abuse and suicidal ideas. The patient is nervous/anxious.   All other systems reviewed and are negative.  Blood pressure 102/88, pulse 89, temperature 98.4 F (36.9 C), temperature source Oral, resp. rate (!) 21,  last menstrual period 12/17/2022, SpO2 100 %, unknown if currently breastfeeding. There is no height or weight on file to calculate BMI.  Treatment Plan Summary: Daily contact with patient to assess and evaluate symptoms and progress in treatment, Medication management, and Plan : Major  depressive disorder, recurrent severe without psychosis: Lexapro 10 mg daily  Anxiety and stimulant use disorder: Gabapentin 300 mg TID  Insomnia Seroquel 50 mg daily at bedtime  Disposition: Recommend psychiatric Inpatient admission when medically cleared.  Waylan Boga, NP 12/27/2022 10:50 AM

## 2022-12-27 NOTE — ED Notes (Signed)
Pt had a phone call from [facility], [phone], but is asleep. Told will give a call back once awake and alert.

## 2022-12-27 NOTE — ED Notes (Signed)
Pt water cup knocked over, water spilled on the floor. Pt states "ma'am,  I made a mess". Pt given 2 towels to clean up the spill. Pt appears lethargic. But is sitting up eating

## 2022-12-27 NOTE — Progress Notes (Signed)
Patient was extremely drowsy during admission, so this Probation officer obtained as much information as possible from patient and allowed her to go to room and get some rest.

## 2022-12-27 NOTE — Progress Notes (Signed)
Admission Note:   Report was received from [place], [place] on a 32 year-old female who presents Voluntary in no acute distress for the treatment of Self-harm behaviors and Depression. Patient appears flat and depressed. Patient was calm and as cooperative as she could be with admission process, being that she was extremely drowsy. Patient endorsed both depression and anxiety, rating them a "9/10" and "5/10", stating that "my kids and I just got out of prison", is why she's feeling like this. Patient also endorsed right arm pain, rating it a "6/10", but did not request any pain medication at this time, she just requested to go to her room to lay down. Patient denies SI/HI/AVH to this Probation officer. Patient states that she lives with her Mother-in-Law, and she can return there upon discharge. Patient did state that she has issues with follow-up, as well as retrieving her medications because she is unemployed and she doesn't have a license. Patient's goals for treatment is that she wants to get help with self-harm behavior. Patient has a past medical history of Hepatitis, PTSD, Depression and Anemia. Skin was assessed with Nicole Kindred, RN and patient has multiple tattoos: left hip, lower back, and left upper back/shoulder; two healing bruises to left upper thigh, left breast abrasion, scabs to left arm and hand, where patient states she was scratching herself to self-harm, and her right arm is completely wrapped in gauze, in which the dressing was changed in the ED prior to being admitted to the unit. Patient searched and no contraband found and unit policies explained and understanding verbalized. Consents have not been obtained as of yet, due to patient wanting to go lay down. Food and fluids offered, and fluids accepted. Patient had no additional questions or concerns to voice at this time.

## 2022-12-27 NOTE — Tx Team (Signed)
Initial Treatment Plan 12/27/2022 3:12 PM Lori Pollard [id number]    PATIENT STRESSORS: Financial difficulties   Legal issue   Marital or family conflict   Substance abuse     PATIENT STRENGTHS: General fund of knowledge  Motivation for treatment/growth  Supportive family/friends    PATIENT IDENTIFIED PROBLEMS: Self-harm behaviors prior to admission  Depression  Anxiety  Substance abuse  Unemployed             DISCHARGE CRITERIA:  Improved stabilization in mood, thinking, and/or behavior Need for constant or close observation no longer present Reduction of life-threatening or endangering symptoms to within safe limits  PRELIMINARY DISCHARGE PLAN: Outpatient therapy Return to previous living arrangement  PATIENT/FAMILY INVOLVEMENT: This treatment plan has been presented to and reviewed with the patient, Lori Pollard. The patient has been given the opportunity to ask questions and make suggestions.  Lise Pincus, RN 12/27/2022, 3:12 PM

## 2022-12-27 NOTE — ED Notes (Addendum)
Gave report to [facility], RN in the [place] to transport patient.

## 2022-12-27 NOTE — Plan of Care (Signed)
New admission.  Problem: Education: Goal: Knowledge of General Education information will improve Description: Including pain rating scale, medication(s)/side effects and non-pharmacologic comfort measures Outcome: Not Progressing   Problem: Health Behavior/Discharge Planning: Goal: Ability to manage health-related needs will improve Outcome: Not Progressing   Problem: Clinical Measurements: Goal: Ability to maintain clinical measurements within normal limits will improve Outcome: Not Progressing Goal: Will remain free from infection Outcome: Not Progressing Goal: Diagnostic test results will improve Outcome: Not Progressing Goal: Respiratory complications will improve Outcome: Not Progressing Goal: Cardiovascular complication will be avoided Outcome: Not Progressing   Problem: Activity: Goal: Risk for activity intolerance will decrease Outcome: Not Progressing   Problem: Nutrition: Goal: Adequate nutrition will be maintained Outcome: Not Progressing   Problem: Coping: Goal: Level of anxiety will decrease Outcome: Not Progressing   Problem: Elimination: Goal: Will not experience complications related to bowel motility Outcome: Not Progressing Goal: Will not experience complications related to urinary retention Outcome: Not Progressing   Problem: Pain Managment: Goal: General experience of comfort will improve Outcome: Not Progressing   Problem: Safety: Goal: Ability to remain free from injury will improve Outcome: Not Progressing   Problem: Skin Integrity: Goal: Risk for impaired skin integrity will decrease Outcome: Not Progressing   Problem: Education: Goal: Knowledge of [place] [facility] Education information/materials will improve Outcome: Not Progressing Goal: Emotional status will improve Outcome: Not Progressing Goal: Mental status will improve Outcome: Not Progressing Goal: Verbalization of understanding the information provided will  improve Outcome: Not Progressing   Problem: Safety: Goal: Periods of time without injury will increase Outcome: Not Progressing   Problem: Health Behavior/Discharge Planning: Goal: Identification of resources available to assist in meeting health care needs will improve Outcome: Not Progressing   Problem: Self-Concept: Goal: Ability to disclose and discuss suicidal ideas will improve Outcome: Not Progressing Goal: Will verbalize positive feelings about self Outcome: Not Progressing

## 2022-12-27 NOTE — ED Notes (Signed)
Pt ambulatory to bathroom with minimal assistance

## 2022-12-27 NOTE — ED Notes (Signed)
VOL/pending inpatient psych admission when medically cleared
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
## 2022-12-27 NOTE — Group Note (Deleted)
LCSW Group Therapy Note   Group Date: 12/27/2022 Start Time: 1200 End Time: 1245   Type of Therapy and Topic:  Group Therapy:   Participation Level:  {[facility] PARTICIPATION [id number]  Description of Group:   Therapeutic Goals:  1.     Summary of Patient Progress:    ***  Therapeutic Modalities:   Doristine Johns 12/27/2022  12:50 PM

## 2022-12-27 NOTE — Progress Notes (Signed)
Pt. Chart reviewed with NP/EDP who provided medical clearance for psychiatric admission.  Charge nurse advised and room assigned. Pt. Admitted to BMU.

## 2022-12-27 NOTE — ED Notes (Addendum)
Pt sleeping, easily aroused but falls back to sleep quickly. NAD at this time.

## 2022-12-27 NOTE — ED Provider Notes (Addendum)
ED ECG REPORT I, Arta Silence, the attending physician, personally viewed and interpreted this ECG.  Date: 12/27/2022 EKG Time: 0925 Rate: 81 Rhythm: normal sinus rhythm QRS Axis: normal Intervals: normal QTc ST/T Wave abnormalities: normal Narrative Interpretation: no evidence of acute ischemia  ----------------------------------------- 10:02 AM on 12/27/2022 -----------------------------------------  I took over care of this patient from Dr. Leonides Schanz.  Repeat potassium level is now 3.5.  EKG is normal with no QT prolongation or other concerning findings.  The patient is medically cleared and stable for discharge from the ED and admission to psychiatry.     Arta Silence, MD 12/27/22 1002

## 2022-12-28 DIAGNOSIS — F332 Major depressive disorder, recurrent severe without psychotic features: Secondary | ICD-10-CM

## 2022-12-28 DIAGNOSIS — S61519A Laceration without foreign body of unspecified wrist, initial encounter: Secondary | ICD-10-CM | POA: Insufficient documentation

## 2022-12-28 DIAGNOSIS — D638 Anemia in other chronic diseases classified elsewhere: Secondary | ICD-10-CM | POA: Insufficient documentation

## 2022-12-28 DIAGNOSIS — X789XXA Intentional self-harm by unspecified sharp object, initial encounter: Secondary | ICD-10-CM | POA: Insufficient documentation

## 2022-12-28 MED ORDER — BUSPIRONE HCL 5 MG PO TABS
10.0000 mg | ORAL_TABLET | Freq: Three times a day (TID) | ORAL | Status: DC
  Administered 2022-12-28 – 2023-01-01 (×12): 10 mg via ORAL
  Filled 2022-12-28 (×12): qty 2

## 2022-12-28 MED ORDER — IBUPROFEN 200 MG PO TABS
400.0000 mg | ORAL_TABLET | Freq: Four times a day (QID) | ORAL | Status: DC | PRN
  Administered 2022-12-29 – 2022-12-31 (×4): 400 mg via ORAL
  Filled 2022-12-28 (×4): qty 2

## 2022-12-28 MED ORDER — FLUOXETINE HCL 20 MG PO CAPS
20.0000 mg | ORAL_CAPSULE | Freq: Every day | ORAL | Status: DC
  Administered 2022-12-28 – 2023-01-01 (×5): 20 mg via ORAL
  Filled 2022-12-28 (×5): qty 1
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
## 2022-12-28 NOTE — BHH Counselor (Signed)
Adult Comprehensive Assessment  Patient ID: Lori Pollard, female   DOB: [birth year], 32 y.o.   MRN: [id number]  Information Source: Information source: Patient  Current Stressors:  Patient states their primary concerns and needs for treatment are:: substance abuse treatment Patient states their goals for this hospitilization and ongoing recovery are:: to go into a residential SA facility Educational / Learning stressors: denies Employment / Job issues: unemployed Family Relationships: Therapist, sports / Lack of resources (include bankruptcy): no income; Engineer, maintenance (IT) / Lack of housing: homeless Physical health (include injuries & life threatening diseases): has wounds on face & on right arm Social relationships: denies Substance abuse: cocaine use; spends about $100 per day for over 10 years Bereavement / Loss: loss of children to "kinship" placement  Living/Environment/Situation:  Living Arrangements: Spouse/significant other, Other relatives Living conditions (as described by patient or guardian): reports that she cannot go back home; currently homeless How long has patient lived in current situation?: since yesterday; 12/27/2022 What is atmosphere in current home: Chaotic  Family History:  Marital status: Long term relationship What types of issues is patient dealing with in the relationship?: dru use Are you sexually active?: Yes What is your sexual orientation?: straight Has your sexual activity been affected by drugs, alcohol, medication, or emotional stress?: no Does patient have children?: Yes How many children?: 4 How is patient's relationship with their children?: Reports that her children are 1, 14, 77 & 60 years old.  They live with paternal aunt, DSS involvement; neglect  Childhood History:  By whom was/is the patient raised?: Mother Description of patient's relationship with caregiver when they were a child: "OK" Patient's description of current  relationship with people who raised him/her: "OK" How were you disciplined when you got in trouble as a child/adolescent?: "i was not punished" Does patient have siblings?: Yes Number of Siblings: 1 Description of patient's current relationship with siblings: has a sister that lives in [place]; no contact Did patient suffer any verbal/emotional/physical/sexual abuse as a child?: No Did patient suffer from severe childhood neglect?: No Has patient ever been sexually abused/assaulted/raped as an adolescent or adult?: No Was the patient ever a victim of a crime or a disaster?: No Witnessed domestic violence?: No Has patient been affected by domestic violence as an adult?: No  Education:  Highest grade of school patient has completed: graduated Currently a Ship broker?: No Learning disability?: No  Employment/Work Situation:   Employment Situation: Unemployed Patient's Job has Been Impacted by Current Illness: No What is the Longest Time Patient has Held a Job?: prior to [employer] Where was the Patient Employed at that Time?: [employer]:   Financial resources: No income Does patient have a Programmer, applications or guardian?: No  Alcohol/Substance Abuse:   What has been your use of drugs/alcohol within the last 12 months?: cocaine, daily for the past 10 years. reports that she spends about $100 per day.  Steals If attempted suicide, did drugs/alcohol play a role in this?: No Alcohol/Substance Abuse Treatment Hx: Denies past history If yes, describe treatment: She denies past history of SA treatment but her chart indicates that she has participated in Suboxone tx Has alcohol/substance abuse ever caused legal problems?: Yes  Social Support System:   Patient's Community Support System: None Describe Community Support System: none Type of faith/religion: n/a How does patient's faith help to cope with current illness?: "by using drugs"  Leisure/Recreation:   Do You  Have Hobbies?: No  Strengths/Needs:   What is  the patient's perception of their strengths?: "I don't have any strengths" Patient states they can use these personal strengths during their treatment to contribute to their recovery: n/a Patient states these barriers may affect/interfere with their treatment: "my drug use" Patient states these barriers may affect their return to the community: "I want to go into an inpatient program" Other important information patient would like considered in planning for their treatment: n/a  Discharge Plan:   Currently receiving community mental health services: No Patient states concerns and preferences for aftercare planning are: "making sure I get help for my drug use." Patient states they will know when they are safe and ready for discharge when: "I can go to a drug program." Does patient have access to transportation?: No Does patient have financial barriers related to discharge medications?: No Patient description of barriers related to discharge medications: has insurance Plan for no access to transportation at discharge: no transportation Plan for living situation after discharge: treatment facility Will patient be returning to same living situation after discharge?: No  Summary/Recommendations:   Summary and Recommendations (to be completed by the evaluator): Lori Pollard is a 32 year old woman that was admitted in [facility] on 12/27/2022 for cocaine use, and self injurious behavior (non SI). She has wounds on her lower face and right forearm that has scabbed over.  She reports that she scratches herself until she bleeds for several years now.  She shared that she was DX with Schizophrenia a few years ago but has only been given medication once right after her DX.  She reports that she was only prescribed Buspar and Prozac. Only took meds for a short while.  She states that she has psychosis and uses drugs to make the voices stop.  She has 4 children that are in  kinship placement due to a DSS open case of neglect. She is unemployed last job pre COVID, has no transportation and is currently homeless previously lived wiht her boyfriend but reports that she is unable to return to that home. She does not have any community mental health treatment but requesting inpatient drug treatment. While here, [place] can benefit from crisis stabilization, medication management, therapeutic milieu, and referrals for services.   Lubertha [place]. 12/28/2022

## 2022-12-28 NOTE — BHH Group Notes (Signed)
Date:  12/28/2022 Time:  10:45 PM  Group Topic/Focus:  Healthy Communication:   The focus of this group is to discuss communication, barriers to communication, as well as healthy ways to communicate with others.    Participation Level:  Did Not Attend  Participation Quality:   None  Affect:   None  Cognitive:  None  Insight: None  Engagement in Group:   None  Modes of Intervention:  Activity and Discussion  Additional Comments:  " What's Your Ideal Support System? "  Lori Pollard 12/28/2022, 10:45 PM

## 2022-12-28 NOTE — Plan of Care (Signed)
D: Patient alert and oriented. Patient rates pain 4/10 for arm pain. Patient endorses anxiety and depression. Patient denies SI/HI/AVH. Patient has been isolative to room during shift with exception to coming out for meals and medication.  A: Scheduled medications administered to patient, per MD orders.  Support and encouragement provided to patient.  Q15 minute safety checks maintained.   R: Patient compliant with medication administration and treatment plan. No adverse drug reactions noted. Patient remains safe on the unit at this time. Problem: Education: Goal: Knowledge of General Education information will improve Description: Including pain rating scale, medication(s)/side effects and non-pharmacologic comfort measures Outcome: Progressing   Problem: Clinical Measurements: Goal: Ability to maintain clinical measurements within normal limits will improve Outcome: Progressing   Problem: Education: Goal: Knowledge of [place] [facility] Education information/materials will improve Outcome: Progressing Goal: Verbalization of understanding the information provided will improve Outcome: Progressing

## 2022-12-28 NOTE — [facility] Group Notes (Signed)
[facility] Group Notes:  (Nursing/MHT/Case Management/Adjunct)  Date:  12/28/2022  Time:  11:22 AM  Type of Therapy:   community meeting  Participation Level:  Did Not Attend   Antonieta Pert 12/28/2022, 11:22 AM

## 2022-12-28 NOTE — Progress Notes (Signed)
Observed in the dayroom interacting with her peers.  She is pleasant and cooperative this evening.  Reports having mild arm pain, but does not ask for medication at this encounter.  She reports dealing with both anxiety and depression rated both "mild"  She denies si/hi/avh. She is med compliant, but asked for Trazodone to help aid in sleep tonight.

## 2022-12-28 NOTE — Plan of Care (Signed)
  Problem: Nutrition: Goal: Adequate nutrition will be maintained Outcome: Progressing   Problem: Elimination: Goal: Will not experience complications related to bowel motility Outcome: Progressing   Problem: Pain Managment: Goal: General experience of comfort will improve Outcome: Progressing   

## 2022-12-28 NOTE — Plan of Care (Signed)
D:  Pt slept for the duration of the shift.  Pt did not leave her room or wake up for medications. Pt got out of bed for vital signs early AM but returned to her room afterwards.  A:  Continued monitoring for safety.    R:  Pt rested throughout the night.  Pt did come to get vital signs taken this morning then returned to bed.  Vital signs remain stable.

## 2022-12-28 NOTE — BHH Suicide Risk Assessment (Signed)
[facility] Admission Suicide Risk Assessment   Nursing information obtained from:  Patient Demographic factors:  Low socioeconomic status, Unemployed, Caucasian Current Mental Status:  Self-harm behaviors, Self-harm thoughts Loss Factors:  Legal issues, Financial problems / change in socioeconomic status Historical Factors:  NA Risk Reduction Factors:  Sense of responsibility to family, Living with another person, especially a relative  Total Time spent with patient: 45 minutes Principal Problem: Major depressive disorder, recurrent severe without psychotic features (Lori Pollard) Diagnosis:  Principal Problem:   Major depressive disorder, recurrent severe without psychotic features (Lori Pollard) Active Problems:   PTSD (post-traumatic stress disorder) dx'd age 72   Cocaine abuse ([place]) onset age 89 IV and smoke daily with last use 07/24/21; last use [id number]   Self-inflicted laceration of wrist (Lori Pollard)   Anemia of chronic disease  Subjective Data: 32 year old woman presented to the emergency room because of extensive self-mutilation.  Patient reports that she has been scratching herself and rubbing herself all over.  She cannot even remember what her thought process was at the time but does not think she was trying to kill her self.  She says she has not slept in days maybe longer than that and has been using cocaine constantly.  Denies any current hallucinations denies suicidal ideation.  Continued Clinical Symptoms:  Alcohol Use Disorder Identification Test Final Score (AUDIT): 0 The "Alcohol Use Disorders Identification Test", Guidelines for Use in Primary Care, Second Edition.  World Pharmacologist [facility]). Score between 0-7:  no or low risk or alcohol related problems. Score between 8-15:  moderate risk of alcohol related problems. Score between 16-19:  high risk of alcohol related problems. Score 20 or above:  warrants further diagnostic evaluation for alcohol dependence and treatment.   CLINICAL  FACTORS:   Severe Anxiety and/or Agitation Depression:   Comorbid alcohol abuse/dependence Dysthymia Alcohol/Substance Abuse/Dependencies   Musculoskeletal: Strength & Muscle Tone: within normal limits Gait & Station: normal Patient leans: N/A  Psychiatric Specialty Exam:  Presentation  General Appearance: No data recorded Eye Contact:No data recorded Speech:No data recorded Speech Volume:No data recorded Handedness:No data recorded  Mood and Affect  Mood:No data recorded Affect:No data recorded  Thought Process  Thought Processes:No data recorded Descriptions of Associations:No data recorded Orientation:No data recorded Thought Content:No data recorded History of Schizophrenia/Schizoaffective disorder:No  Duration of Psychotic Symptoms:No data recorded Hallucinations:No data recorded Ideas of Reference:No data recorded Suicidal Thoughts:No data recorded Homicidal Thoughts:No data recorded  Sensorium  Memory:No data recorded Judgment:No data recorded Insight:No data recorded  Executive Functions  Concentration:No data recorded Attention Span:No data recorded Recall:No data recorded Fund of Knowledge:No data recorded Language:No data recorded  Psychomotor Activity  Psychomotor Activity:No data recorded  Assets  Assets:No data recorded  Sleep  Sleep:No data recorded   Physical Exam: Physical Exam Vitals and nursing note reviewed.  Constitutional:      Appearance: Normal appearance. She is ill-appearing.  HENT:     Head: Normocephalic and atraumatic.     Mouth/Throat:     Pharynx: Oropharynx is clear.  Eyes:     Pupils: Pupils are equal, round, and reactive to light.  Cardiovascular:     Rate and Rhythm: Normal rate and regular rhythm.  Pulmonary:     Effort: Pulmonary effort is normal.     Breath sounds: Normal breath sounds.  Abdominal:     General: Abdomen is flat.     Palpations: Abdomen is soft.  Musculoskeletal:        General: Normal  range of motion.  Skin:    General: Skin is warm and dry.     Comments: Patient has extensive skin lesions.  Her lips and the area around her lips have been excoriated with scabs on them.  She has large areas of excoriation on her left and right forearms.  The right is bandaged.  There are scabs on all of these areas.  No active bleeding right now.  Neurological:     General: No focal deficit present.     Mental Status: She is alert. Mental status is at baseline.  Psychiatric:        Attention and Perception: She is inattentive.        Mood and Affect: Mood is anxious. Affect is blunt.        Speech: Speech normal.        Behavior: Behavior is cooperative.        Thought Content: Thought content is paranoid. Thought content does not include suicidal ideation.        Cognition and Memory: Memory is impaired.        Judgment: Judgment is impulsive.    Review of Systems  Constitutional: Negative.   HENT: Negative.    Eyes: Negative.   Respiratory: Negative.    Cardiovascular: Negative.   Gastrointestinal: Negative.   Musculoskeletal: Negative.   Skin: Negative.   Neurological: Negative.   Psychiatric/Behavioral:  Positive for depression, memory loss and substance abuse. Negative for suicidal ideas. The patient is nervous/anxious and has insomnia.    Blood pressure 113/75, pulse (!) 102, temperature 98 F (36.7 C), temperature source Oral, resp. rate 18, height 4' 11"$  (1.499 m), weight 46.7 kg, last menstrual period 12/17/2022, SpO2 100 %, unknown if currently breastfeeding. Body mass index is 20.8 kg/m.   COGNITIVE FEATURES THAT CONTRIBUTE TO RISK:  Loss of executive function    SUICIDE RISK:   Minimal: No identifiable suicidal ideation.  Patients presenting with no risk factors but with morbid ruminations; may be classified as minimal risk based on the severity of the depressive symptoms  PLAN OF CARE: Continue 15-minute checks.  Restart medicines based on what was previously  effective.  Labs will be reviewed.  Engage in individual and group therapy ongoing assessment by treatment team.  Assessment of dangerousness prior to discharge.  I certify that inpatient services furnished can reasonably be expected to improve the patient's condition.   Alethia Berthold, MD 12/28/2022, 3:36 PM

## 2022-12-29 DIAGNOSIS — F332 Major depressive disorder, recurrent severe without psychotic features: Principal | ICD-10-CM

## 2022-12-29 MED ORDER — ONDANSETRON 4 MG PO TBDP
4.0000 mg | ORAL_TABLET | Freq: Three times a day (TID) | ORAL | Status: DC | PRN
  Administered 2022-12-29: 4 mg via ORAL
  Filled 2022-12-29: qty 1

## 2022-12-29 MED ORDER — BACITRACIN-NEOMYCIN-POLYMYXIN OINTMENT TUBE
TOPICAL_OINTMENT | Freq: Two times a day (BID) | CUTANEOUS | Status: DC
  Filled 2022-12-29: qty 14.17

## 2022-12-29 NOTE — Progress Notes (Signed)
Left arm unwrapped. Wounds from scratching.

## 2022-12-29 NOTE — BH IP Treatment Plan (Signed)
Interdisciplinary Treatment and Diagnostic Plan Update  12/29/2022 Time of Session: 9:07AM Lori Pollard MRN: [id number]  Principal Diagnosis: Major depressive disorder, recurrent severe without psychotic features [facility])  Secondary Diagnoses: Principal Problem:   Major depressive disorder, recurrent severe without psychotic features ([place]) Active Problems:   PTSD (post-traumatic stress disorder) dx'd age 32   Cocaine abuse ([place]) onset age 59 IV and smoke daily with last use 07/24/21; last use [id number]   Self-inflicted laceration of wrist ([place])   Anemia of chronic disease   Current Medications:  Current Facility-Administered Medications  Medication Dose Route Frequency Provider Last Rate Last Admin   acetaminophen (TYLENOL) tablet 650 mg  650 mg Oral Q6H PRN [name], NP   650 mg at 12/29/22 0820   alum & mag hydroxide-simeth (MAALOX/MYLANTA) 200-200-20 MG/5ML suspension 30 mL  30 mL Oral Q4H PRN [name], NP       busPIRone (BUSPAR) tablet 10 mg  10 mg Oral TID Clapacs, [name], MD   10 mg at 12/29/22 0820   ferrous sulfate tablet 325 mg  325 mg Oral Q breakfast [name], NP   325 mg at 12/29/22 0820   FLUoxetine (PROZAC) capsule 20 mg  20 mg Oral Daily Clapacs, [name], MD   20 mg at 12/29/22 0820   gabapentin (NEURONTIN) capsule 300 mg  300 mg Oral TID [name], NP   300 mg at 12/29/22 0820   ibuprofen (ADVIL) tablet 400 mg  400 mg Oral Q6H PRN Clapacs, [name], MD       magnesium hydroxide (MILK OF MAGNESIA) suspension 30 mL  30 mL Oral Daily PRN [name], NP       phenylephrine-shark liver oil-mineral oil-petrolatum (PREPARATION H) rectal ointment 1 Application  1 Application Rectal BID PRN Clapacs, [name], MD       QUEtiapine (SEROQUEL) tablet 50 mg  50 mg Oral QHS [name], NP   50 mg at 12/28/22 2131   PTA Medications: Medications Prior to Admission  Medication Sig Dispense Refill Last Dose   atomoxetine (STRATTERA) 25 MG capsule  Take 25 mg by mouth every morning.      busPIRone (BUSPAR) 15 MG tablet Take 15 mg by mouth 2 (two) times daily. (Patient not taking: Reported on 06/23/2022)      desogestrel-ethinyl estradiol (MIRCETTE) 0.15-0.02/0.01 MG (21/5) tablet Take 1 tablet by mouth at bedtime. 84 tablet 0    ferrous sulfate 325 (65 FE) MG tablet Take 325 mg by mouth daily with breakfast.      ibuprofen (ADVIL) 200 MG tablet Take 400 mg by mouth every 6 (six) hours as needed for mild pain.      MAVYRET 100-40 MG TABS Take 3 tablets every day by oral route.      methocarbamol (ROBAXIN) 500 MG tablet Take 1 tablet (500 mg total) by mouth every 6 (six) hours as needed for muscle spasms. 28 tablet 0     Patient Stressors: Soil scientist issue   Marital or family conflict   Substance abuse    Patient Strengths: General fund of knowledge  Motivation for treatment/growth  Supportive family/friends   Treatment Modalities: Medication Management, Group therapy, Case management,  1 to 1 session with clinician, Psychoeducation, Recreational therapy.   Physician Treatment Plan for Primary Diagnosis: Major depressive disorder, recurrent severe without psychotic features ([place]) Long Term Goal(s): Improvement in symptoms so as ready for discharge   Short Term Goals:  Ability to maintain clinical measurements within normal limits will improve Compliance with prescribed medications will improve Ability to demonstrate self-control will improve Ability to identify and develop effective coping behaviors will improve  Medication Management: Evaluate patient's response, side effects, and tolerance of medication regimen.  Therapeutic Interventions: 1 to 1 sessions, Unit Group sessions and Medication administration.  Evaluation of Outcomes: Not Met  Physician Treatment Plan for Secondary Diagnosis: Principal Problem:   Major depressive disorder, recurrent severe without psychotic features ([place]) Active Problems:    PTSD (post-traumatic stress disorder) dx'd age 42   Cocaine abuse ([place]) onset age 35 IV and smoke daily with last use 07/24/21; last use 123XX123   Self-inflicted laceration of wrist ([place])   Anemia of chronic disease  Long Term Goal(s): Improvement in symptoms so as ready for discharge   Short Term Goals: Ability to maintain clinical measurements within normal limits will improve Compliance with prescribed medications will improve Ability to demonstrate self-control will improve Ability to identify and develop effective coping behaviors will improve     Medication Management: Evaluate patient's response, side effects, and tolerance of medication regimen.  Therapeutic Interventions: 1 to 1 sessions, Unit Group sessions and Medication administration.  Evaluation of Outcomes: Not Met   RN Treatment Plan for Primary Diagnosis: Major depressive disorder, recurrent severe without psychotic features ([place]) Long Term Goal(s): Knowledge of disease and therapeutic regimen to maintain health will improve  Short Term Goals: Ability to demonstrate self-control, Ability to participate in decision making will improve, Ability to verbalize feelings will improve, Ability to disclose and discuss suicidal ideas, Ability to identify and develop effective coping behaviors will improve, and Compliance with prescribed medications will improve  Medication Management: RN will administer medications as ordered by provider, will assess and evaluate patient's response and provide education to patient for prescribed medication. RN will report any adverse and/or side effects to prescribing provider.  Therapeutic Interventions: 1 on 1 counseling sessions, Psychoeducation, Medication administration, Evaluate responses to treatment, Monitor vital signs and CBGs as ordered, Perform/monitor CIWA, COWS, AIMS and Fall Risk screenings as ordered, Perform wound care treatments as ordered.  Evaluation of Outcomes: Not  Met   LCSW Treatment Plan for Primary Diagnosis: Major depressive disorder, recurrent severe without psychotic features ([place]) Long Term Goal(s): Safe transition to appropriate next level of care at discharge, Engage patient in therapeutic group addressing interpersonal concerns.  Short Term Goals: Engage patient in aftercare planning with referrals and resources, Increase social support, Increase ability to appropriately verbalize feelings, Increase emotional regulation, Facilitate acceptance of mental health diagnosis and concerns, Facilitate patient progression through stages of change regarding substance use diagnoses and concerns, Identify triggers associated with mental health/substance abuse issues, and Increase skills for wellness and recovery  Therapeutic Interventions: Assess for all discharge needs, 1 to 1 time with Social worker, Explore available resources and support systems, Assess for adequacy in community support network, Educate family and significant other(s) on suicide prevention, Complete Psychosocial Assessment, Interpersonal group therapy.  Evaluation of Outcomes: Not Met   Progress in Treatment: Attending groups: Yes. Participating in groups: Yes. Taking medication as prescribed: Yes. Toleration medication: Yes. Family/Significant other contact made: No, will contact:  once permission is given. Patient understands diagnosis: Yes. Discussing patient identified problems/goals with staff: Yes. Medical problems stabilized or resolved: Yes. Denies suicidal/homicidal ideation: Yes. Issues/concerns per patient self-inventory: No. Other: none  New problem(s) identified: No, Describe:  none  New Short Term/Long Term Goal(s): detox, elimination of symptoms of psychosis, medication management for  mood stabilization; elimination of SI thoughts; development of comprehensive mental wellness/sobriety plan.   Patient Goals:  "rehabilitations, being able to get up and take  showers"  Discharge Plan or Barriers: CSW to assist patient in development of appropriate discharge plans.    Reason for Continuation of Hospitalization: Anxiety Depression Medical Issues Medication stabilization Withdrawal symptoms  Estimated Length of Stay:  1-7 days  Last 3 [place] Suicide Severity Risk Score: [place] Admission (Current) from 12/27/2022 in [facility] ED from 12/26/2022 in [facility] to Hosp-Admission (Discharged) from 06/22/2022 in [place] No Risk Low Risk No Risk       Last PHQ 2/9 Scores:    04/09/2022    2:02 PM 09/09/2021    8:47 AM 05/02/2021    2:50 PM  Depression screen PHQ 2/9  Decreased Interest 1 1 3  $ Down, Depressed, Hopeless 2 1 3  $ PHQ - 2 Score 3 2 6  $ Altered sleeping 3 2 3  $ Tired, decreased energy 3 1 2  $ Change in appetite 3 2 3  $ Feeling bad or failure about yourself  3 2 3  $ Trouble concentrating 3 1 3  $ Moving slowly or fidgety/restless 1 0 3  Suicidal thoughts 1 0 3  PHQ-9 Score 20 10 26  $ Difficult doing work/chores Very difficult  Extremely dIfficult    Scribe for Treatment Team: Rozann Lesches, LCSW 12/29/2022 10:07 AM

## 2022-12-29 NOTE — Group Note (Signed)
Recreation Therapy Group Note   Group Topic:Coping Skills  Group Date: 12/29/2022 Start Time: 1000 End Time: 1045 Facilitators: [name], LRT, CTRS Location:  Craftroom  Group description: Now Future Wall. Patients were given a sheet of paper and asked to fold it into 3 sections, like a pamphlet. Top section, patients were encouraged to write what they are feeling or experiencing "now". The bottom section, patients were asked to fill out how they want to feel or things they want to experience in the "future".  In the middle section, patients were encouraged to fill out any "walls" or barriers that are getting in the way of them reaching their "future". On the back of the sheet, patients were encouraged to write positive coping skills that will help them get over or though the walls they experience. LRT and patients discussed each of the sections and shared them aloud in group. Patients are encouraged to keep this paper with them as a guide/plan post discharge.   Affect/Mood: N/A   Participation Level: Did not attend    Clinical Observations/Individualized Feedback: Lori Pollard did not attend group due to resting in her room.   Plan: Continue to engage patient in RT group sessions 2-3x/week.   [name], LRT, CTRS 12/29/2022 11:34 AM

## 2022-12-29 NOTE — Progress Notes (Signed)
Pt denies SI/HI/AVH and verbally agrees to approach staff if these become apparent or before harming themselves/others. Rates depression 5/10. Rates anxiety 5/10. Rates pain 8/10.  Pt wound dressing changed at 1515. Wound was dry, intact and healing.  Pt has been in her room for most of the day. Pt is pleasant. Scheduled medications administered to pt, per MD orders. RN provided support and encouragement to pt. Q15 min safety checks implemented and continued. Pt safe on the unit. RN will continue to monitor and intervene as needed.  12/29/22 0820  Psych Admission Type (Psych Patients Only)  Admission Status Voluntary  Psychosocial Assessment  Patient Complaints Anxiety;Depression  Eye Contact Fair  Facial Expression Anxious  Affect Anxious;Depressed  Speech Logical/coherent  Interaction Assertive  Motor Activity Slow  Appearance/Hygiene In scrubs;Disheveled  Behavior Characteristics Cooperative;Appropriate to situation;Anxious  Mood Depressed;Anxious;Pleasant  Aggressive Behavior  Effect No apparent injury  Thought Process  Coherency Circumstantial  Content WDL  Delusions None reported or observed  Perception WDL  Hallucination None reported or observed  Judgment Impaired  Confusion None  Danger to Self  Current suicidal ideation? Denies  Danger to Others  Danger to Others None reported or observed

## 2022-12-29 NOTE — Group Note (Signed)
[facility] LCSW Group Therapy Note    Group Date: 12/29/2022 Start Time: [id number] End Time: 1400  Type of Therapy and Topic:  Group Therapy:  Overcoming Obstacles  Participation Level:  [facility] PARTICIPATION LEVEL: Did Not Attend  Mood:  Description of Group:   In this group patients will be encouraged to explore what they see as obstacles to their own wellness and recovery. They will be guided to discuss their thoughts, feelings, and behaviors related to these obstacles. The group will process together ways to cope with barriers, with attention given to specific choices patients can make. Each patient will be challenged to identify changes they are motivated to make in order to overcome their obstacles. This group will be process-oriented, with patients participating in exploration of their own experiences as well as giving and receiving support and challenge from other group members.  Therapeutic Goals: 1. Patient will identify personal and current obstacles as they relate to admission. 2. Patient will identify barriers that currently interfere with their wellness or overcoming obstacles.  3. Patient will identify feelings, thought process and behaviors related to these barriers. 4. Patient will identify two changes they are willing to make to overcome these obstacles:    Summary of Patient Progress   X   Therapeutic Modalities:   Cognitive Behavioral Therapy Solution Focused Therapy Motivational Interviewing Relapse Prevention Therapy   Rozann Lesches, LCSW

## 2022-12-29 NOTE — [facility] Group Notes (Signed)
[facility] Group Notes:  (Nursing/MHT/Case Management/Adjunct)  Date:  12/29/2022  Time:  9:38 AM  Type of Therapy:   Community Meeting  Participation Level:  Active  Participation Quality:  Appropriate  Affect:  Appropriate  Cognitive:  Alert and Appropriate  Insight:  Appropriate  Engagement in Group:  Engaged  Modes of Intervention:  Discussion and Education  Summary of Progress/Problems:  Lori Pollard 12/29/2022, 9:38 AM

## 2022-12-29 NOTE — Plan of Care (Signed)
  Problem: Clinical Measurements: Goal: Will remain free from infection Outcome: Progressing   Problem: Nutrition: Goal: Adequate nutrition will be maintained Outcome: Progressing   Problem: Coping: Goal: Level of anxiety will decrease Outcome: Progressing   Problem: Education: Goal: Knowledge of [facility] Education information/materials will improve Outcome: Progressing   Problem: Pain Managment: Goal: General experience of comfort will improve Outcome: Not Progressing

## 2022-12-29 NOTE — Progress Notes (Signed)
[facility] MD Progress Note  12/29/2022 2:31 PM Lori Pollard  MRN:  [id number] Subjective: Follow-up 32 year old woman with history of depression and PTSD and cocaine abuse.  She attended treatment team today.  Patient looked a little bit less "dazed" than she did yesterday.  Still feeling nervous.  Tolerating medicine.  Cooperating with staff.  Feels like she needs to go into some kind of longer term treatment since she does not have any safe place to go right now. Principal Problem: Major depressive disorder, recurrent severe without psychotic features ([place]) Diagnosis: Principal Problem:   Major depressive disorder, recurrent severe without psychotic features ([place]) Active Problems:   PTSD (post-traumatic stress disorder) dx'd age 89   Cocaine abuse ([place]) onset age 6 IV and smoke daily with last use 07/24/21; last use [id number]   Self-inflicted laceration of wrist ([place])   Anemia of chronic disease  Total Time spent with patient: 30 minutes  Past Psychiatric History: Past history of PTSD depression and substance use  Past Medical History:  Past Medical History:  Diagnosis Date   Anemia    Anxiety    Cervicalgia    Depression    Hepatitis    History of chickenpox    Hives    Mental disorder    Polysubstance abuse ([place])    Preterm labor    Vaginal Pap smear, abnormal     Past Surgical History:  Procedure Laterality Date   CESAREAN SECTION  2013   CESAREAN SECTION  2015   CESAREAN SECTION N/A 11/01/2019   Procedure: CESAREAN SECTION;  Surgeon: Homero Fellers, MD;  Location: [facility];  Service: Obstetrics;  Laterality: N/A;   DILATION AND EVACUATION N/A 03/18/2022   Procedure: DILATATION AND EVACUATION WITH JADA PLACEMENT;  Surgeon: Harlin Heys, MD;  Location: [facility];  Service: Gynecology;  Laterality: N/A;   FEMUR IM NAIL Left 06/23/2022   Procedure: INTRAMEDULLARY (IM) RETROGRADE FEMORAL NAILING;  Surgeon: Shona Needles, MD;  Location: [place];  Service:  Orthopedics;  Laterality: Left;   INCISION AND DRAINAGE OF WOUND Right 06/25/2020   Procedure: IRRIGATION AND DEBRIDEMENT WOUND-Right Leg;  Surgeon: Leim Fabry, MD;  Location: [facility];  Service: Orthopedics;  Laterality: Right;   TIBIA IM NAIL INSERTION Left 06/23/2022   Procedure: INTRAMEDULLARY (IM) NAIL TIBIAL;  Surgeon: Shona Needles, MD;  Location: [place];  Service: Orthopedics;  Laterality: Left;   Family History:  Family History  Problem Relation Age of Onset   Alcohol abuse Mother    GER disease Mother    Depression Mother    Alcohol abuse Father    Emphysema Father    Cancer Maternal Grandmother    Diabetes Maternal Grandmother    Heart disease Maternal Grandmother    Hyperlipidemia Maternal Grandmother    Hypertension Maternal Grandmother    Kidney disease Maternal Grandmother    Lung cancer Maternal Grandmother    Family Psychiatric  History: See previous Social History:  Social History   Substance and Sexual Activity  Alcohol Use No   Alcohol/week: 0.0 standard drinks of alcohol     Social History   Substance and Sexual Activity  Drug Use Not Currently   Types: Cocaine, Methamphetamines, Heroin   Comment: last cocaine use was 2 days ago (12/15/21), pt currently takes suboxone    Social History   Socioeconomic History   Marital status: Single    Spouse name: Not on file   Number of children: 3   Years of education: 60  Highest education level: GED or equivalent  Occupational History   Not on file  Tobacco Use   Smoking status: Former    Packs/day: 0.50    Years: 11.00    Total pack years: 5.50    Types: Cigarettes    Start date: 09/17/2004    Passive exposure: Past   Smokeless tobacco: Never  Vaping Use   Vaping Use: Never used  Substance and Sexual Activity   Alcohol use: No    Alcohol/week: 0.0 standard drinks of alcohol   Drug use: Not Currently    Types: Cocaine, Methamphetamines, Heroin    Comment: last cocaine use was 2 days ago (12/15/21),  pt currently takes suboxone   Sexual activity: Not Currently    Partners: Male    Birth control/protection: OCP, Surgical    Comment: BTL  Other Topics Concern   Not on file  Social History Narrative   Not on file   Social Determinants of Health   Financial Resource Strain: Medium Risk (09/09/2021)   Overall Financial Resource Strain (CARDIA)    Difficulty of Paying Living Expenses: Somewhat hard  Food Insecurity: No Food Insecurity (12/27/2022)   Hunger Vital Sign    Worried About Running Out of Food in the Last Year: Never true    Ran Out of Food in the Last Year: Never true  Transportation Needs: Unmet Transportation Needs (12/27/2022)   PRAPARE - Hydrologist (Medical): Yes    Lack of Transportation (Non-Medical): Yes  Physical Activity: Not on file  Stress: Not on file  Social Connections: Not on file   Additional Social History:                         Sleep: Fair  Appetite:  Fair  Current Medications: Current Facility-Administered Medications  Medication Dose Route Frequency Provider Last Rate Last Admin   acetaminophen (TYLENOL) tablet 650 mg  650 mg Oral Q6H PRN [name], NP   650 mg at 12/29/22 0820   alum & mag hydroxide-simeth (MAALOX/MYLANTA) 200-200-20 MG/5ML suspension 30 mL  30 mL Oral Q4H PRN [name], NP       busPIRone (BUSPAR) tablet 10 mg  10 mg Oral TID Tanganika Barradas T, MD   10 mg at 12/29/22 1234   ferrous sulfate tablet 325 mg  325 mg Oral Q breakfast [name], NP   325 mg at 12/29/22 0820   FLUoxetine (PROZAC) capsule 20 mg  20 mg Oral Daily Theran Vandergrift T, MD   20 mg at 12/29/22 0820   gabapentin (NEURONTIN) capsule 300 mg  300 mg Oral TID [name], NP   300 mg at 12/29/22 1233   ibuprofen (ADVIL) tablet 400 mg  400 mg Oral Q6H PRN Trevante Tennell T, MD   400 mg at 12/29/22 1234   magnesium hydroxide (MILK OF MAGNESIA) suspension 30 mL  30 mL Oral Daily PRN [name], NP        neomycin-bacitracin-polymyxin (NEOSPORIN) ointment   Topical BID Morris Markham, [name], MD       phenylephrine-shark liver oil-mineral oil-petrolatum (PREPARATION H) rectal ointment 1 Application  1 Application Rectal BID PRN Messi Twedt T, MD       QUEtiapine (SEROQUEL) tablet 50 mg  50 mg Oral QHS [name], NP   50 mg at 12/28/22 2131    Lab Results: No results found for this or any previous visit (from  the past 48 hour(s)).  Blood Alcohol level:  Lab Results  Component Value Date   ETH <10 12/26/2022   ETH <10 Q000111Q    Metabolic Disorder Labs: No results found for: "HGBA1C", "MPG" No results found for: "PROLACTIN" No results found for: "CHOL", "TRIG", "HDL", "CHOLHDL", "VLDL", "LDLCALC"  Physical Findings: AIMS: Facial and Oral Movements Muscles of Facial Expression: None, normal Lips and Perioral Area: None, normal Jaw: None, normal Tongue: None, normal,Extremity Movements Upper (arms, wrists, hands, fingers): None, normal Lower (legs, knees, ankles, toes): None, normal, Trunk Movements Neck, shoulders, hips: None, normal, Overall Severity Severity of abnormal movements (highest score from questions above): None, normal Incapacitation due to abnormal movements: None, normal Patient's awareness of abnormal movements (rate only patient's report): No Awareness, Dental Status Current problems with teeth and/or dentures?: No Does patient usually wear dentures?: No  CIWA:    COWS:     Musculoskeletal: Strength & Muscle Tone: within normal limits Gait & Station: normal Patient leans: N/A  Psychiatric Specialty Exam:  Presentation  General Appearance: No data recorded Eye Contact:No data recorded Speech:No data recorded Speech Volume:No data recorded Handedness:No data recorded  Mood and Affect  Mood:No data recorded Affect:No data recorded  Thought Process  Thought Processes:No data recorded Descriptions of Associations:No data recorded Orientation:No  data recorded Thought Content:No data recorded History of Schizophrenia/Schizoaffective disorder:No  Duration of Psychotic Symptoms:No data recorded Hallucinations:No data recorded Ideas of Reference:No data recorded Suicidal Thoughts:No data recorded Homicidal Thoughts:No data recorded  Sensorium  Memory:No data recorded Judgment:No data recorded Insight:No data recorded  Executive Functions  Concentration:No data recorded Attention Span:No data recorded Recall:No data recorded Fund of Knowledge:No data recorded Language:No data recorded  Psychomotor Activity  Psychomotor Activity:No data recorded  Assets  Assets:No data recorded  Sleep  Sleep:No data recorded   Physical Exam: Physical Exam Vitals and nursing note reviewed.  Constitutional:      Appearance: Normal appearance.  HENT:     Head: Normocephalic and atraumatic.     Mouth/Throat:     Pharynx: Oropharynx is clear.  Eyes:     Pupils: Pupils are equal, round, and reactive to light.  Cardiovascular:     Rate and Rhythm: Normal rate and regular rhythm.  Pulmonary:     Effort: Pulmonary effort is normal.     Breath sounds: Normal breath sounds.  Abdominal:     General: Abdomen is flat.     Palpations: Abdomen is soft.  Musculoskeletal:        General: Normal range of motion.  Skin:    General: Skin is warm and dry.     Comments: Multiple abrasions on both forearms as well as on her face  Neurological:     General: No focal deficit present.     Mental Status: She is alert. Mental status is at baseline.  Psychiatric:        Attention and Perception: Attention normal.        Mood and Affect: Mood is anxious.        Speech: Speech is delayed.        Behavior: Behavior is cooperative.        Thought Content: Thought content normal.        Cognition and Memory: Memory is impaired.    ROS Blood pressure 105/73, pulse (!) 101, temperature 97.9 F (36.6 C), temperature source Oral, resp. rate 18,  height 4' 11"$  (1.499 m), weight 46.7 kg, last menstrual period 12/17/2022, SpO2 100 %, unknown if  currently breastfeeding. Body mass index is 20.8 kg/m.   Treatment Plan Summary: Plan reviewed the wounds with nursing.  We will take care of those as best we can.  If they need to have some other consult we will do it.  Probably just need antibiotic ointment and dressing.  Patient has been started back on medicine for depression.  Encouraged her to be eating and drinking well.  Has 50 mg of Seroquel at night if she is not sleeping adequately with that we will increase it.  Encouraged her to meet with treatment team to discuss follow-up options  Alethia Berthold, MD 12/29/2022, 2:31 PM

## 2022-12-30 DIAGNOSIS — F332 Major depressive disorder, recurrent severe without psychotic features: Secondary | ICD-10-CM | POA: Diagnosis not present

## 2022-12-30 NOTE — BHH Counselor (Signed)
CSW provided patient a list of residential SUD facilities.    Patient to review and follow up with CSW team.   Assunta Curtis, MSW, LCSW 12/30/2022 10:31 AM

## 2022-12-30 NOTE — BHH Group Notes (Signed)
[facility] Group Notes:  (Nursing/MHT/Case Management/Adjunct)  Date:  12/30/2022  Time:  8:39 PM  Type of Therapy:   Wrap up  Participation Level:  Active  Participation Quality:  Appropriate  Affect:  Appropriate  Cognitive:  Alert  Insight:  Good  Engagement in Group:  Engaged and goal was to go outside today.  Modes of Intervention:  Support  Summary of Progress/Problems:  Lori Pollard 12/30/2022, 8:39 PM

## 2022-12-30 NOTE — Progress Notes (Signed)
[facility] MD Progress Note  12/30/2022 4:00 PM Lori Pollard  MRN:  [id number] Subjective: Patient seen and chart reviewed.  Patient still presents her chief complaint as being fatigued.  Sleeping most of the time although she is eating a little better.  Cooperating with medication and with dressing changes.  Took a look at her arm today and took a photo for the chart.  Arm appears to be healing up but will stay dressed with antibiotic ointment for the time being.  No active suicidal thoughts still a bit confused having trouble engaging from fatigue Principal Problem: Major depressive disorder, recurrent severe without psychotic features ([place]) Diagnosis: Principal Problem:   Major depressive disorder, recurrent severe without psychotic features ([place]) Active Problems:   PTSD (post-traumatic stress disorder) dx'd age 21   Cocaine abuse ([place]) onset age 40 IV and smoke daily with last use 07/24/21; last use [id number]   Self-inflicted laceration of wrist ([place])   Anemia of chronic disease  Total Time spent with patient: 30 minutes  Past Psychiatric History: Past history of depression and PTSD substance abuse  Past Medical History:  Past Medical History:  Diagnosis Date   Anemia    Anxiety    Cervicalgia    Depression    Hepatitis    History of chickenpox    Hives    Mental disorder    Polysubstance abuse ([place])    Preterm labor    Vaginal Pap smear, abnormal     Past Surgical History:  Procedure Laterality Date   CESAREAN SECTION  2013   CESAREAN SECTION  2015   CESAREAN SECTION N/A 11/01/2019   Procedure: CESAREAN SECTION;  Surgeon: Homero Fellers, MD;  Location: [facility];  Service: Obstetrics;  Laterality: N/A;   DILATION AND EVACUATION N/A 03/18/2022   Procedure: DILATATION AND EVACUATION WITH JADA PLACEMENT;  Surgeon: Harlin Heys, MD;  Location: [facility];  Service: Gynecology;  Laterality: N/A;   FEMUR IM NAIL Left 06/23/2022   Procedure: INTRAMEDULLARY (IM)  RETROGRADE FEMORAL NAILING;  Surgeon: Shona Needles, MD;  Location: [place];  Service: Orthopedics;  Laterality: Left;   INCISION AND DRAINAGE OF WOUND Right 06/25/2020   Procedure: IRRIGATION AND DEBRIDEMENT WOUND-Right Leg;  Surgeon: Leim Fabry, MD;  Location: [facility];  Service: Orthopedics;  Laterality: Right;   TIBIA IM NAIL INSERTION Left 06/23/2022   Procedure: INTRAMEDULLARY (IM) NAIL TIBIAL;  Surgeon: Shona Needles, MD;  Location: [place];  Service: Orthopedics;  Laterality: Left;   Family History:  Family History  Problem Relation Age of Onset   Alcohol abuse Mother    GER disease Mother    Depression Mother    Alcohol abuse Father    Emphysema Father    Cancer Maternal Grandmother    Diabetes Maternal Grandmother    Heart disease Maternal Grandmother    Hyperlipidemia Maternal Grandmother    Hypertension Maternal Grandmother    Kidney disease Maternal Grandmother    Lung cancer Maternal Grandmother    Family Psychiatric  History: See previous Social History:  Social History   Substance and Sexual Activity  Alcohol Use No   Alcohol/week: 0.0 standard drinks of alcohol     Social History   Substance and Sexual Activity  Drug Use Not Currently   Types: Cocaine, Methamphetamines, Heroin   Comment: last cocaine use was 2 days ago (12/15/21), pt currently takes suboxone    Social History   Socioeconomic History   Marital status: Single    Spouse  name: Not on file   Number of children: 3   Years of education: 9   Highest education level: GED or equivalent  Occupational History   Not on file  Tobacco Use   Smoking status: Former    Packs/day: 0.50    Years: 11.00    Total pack years: 5.50    Types: Cigarettes    Start date: 09/17/2004    Passive exposure: Past   Smokeless tobacco: Never  Vaping Use   Vaping Use: Never used  Substance and Sexual Activity   Alcohol use: No    Alcohol/week: 0.0 standard drinks of alcohol   Drug use: Not Currently    Types:  Cocaine, Methamphetamines, Heroin    Comment: last cocaine use was 2 days ago (12/15/21), pt currently takes suboxone   Sexual activity: Not Currently    Partners: Male    Birth control/protection: OCP, Surgical    Comment: BTL  Other Topics Concern   Not on file  Social History Narrative   Not on file   Social Determinants of Health   Financial Resource Strain: Medium Risk (09/09/2021)   Overall Financial Resource Strain (CARDIA)    Difficulty of Paying Living Expenses: Somewhat hard  Food Insecurity: No Food Insecurity (12/27/2022)   Hunger Vital Sign    Worried About Running Out of Food in the Last Year: Never true    Ran Out of Food in the Last Year: Never true  Transportation Needs: Unmet Transportation Needs (12/27/2022)   PRAPARE - Hydrologist (Medical): Yes    Lack of Transportation (Non-Medical): Yes  Physical Activity: Not on file  Stress: Not on file  Social Connections: Not on file   Additional Social History:                         Sleep: Fair  Appetite:  Fair  Current Medications: Current Facility-Administered Medications  Medication Dose Route Frequency Provider Last Rate Last Admin   acetaminophen (TYLENOL) tablet 650 mg  650 mg Oral Q6H PRN [name], NP   650 mg at 12/30/22 0811   alum & mag hydroxide-simeth (MAALOX/MYLANTA) 200-200-20 MG/5ML suspension 30 mL  30 mL Oral Q4H PRN [name], NP       busPIRone (BUSPAR) tablet 10 mg  10 mg Oral TID Mckinzee Spirito T, MD   10 mg at 12/30/22 1149   ferrous sulfate tablet 325 mg  325 mg Oral Q breakfast [name], NP   325 mg at 12/30/22 [id number]   FLUoxetine (PROZAC) capsule 20 mg  20 mg Oral Daily Praise Stennett T, MD   20 mg at 12/30/22 [id number]   gabapentin (NEURONTIN) capsule 300 mg  300 mg Oral TID [name], NP   300 mg at 12/30/22 1149   ibuprofen (ADVIL) tablet 400 mg  400 mg Oral Q6H PRN Niyah Mamaril T, MD   400 mg at 12/30/22 1149   magnesium  hydroxide (MILK OF MAGNESIA) suspension 30 mL  30 mL Oral Daily PRN [name], NP       neomycin-bacitracin-polymyxin (NEOSPORIN) ointment   Topical BID Alexys Lobello, [name], MD   Given at 12/30/22 1206   ondansetron (ZOFRAN-ODT) disintegrating tablet 4 mg  4 mg Oral Q8H PRN Dary Dilauro, [name], MD   4 mg at 12/29/22 1444   phenylephrine-shark liver oil-mineral oil-petrolatum (PREPARATION H) rectal ointment 1 Application  1 Application Rectal BID PRN Shaterica Mcclatchy,  [name], MD       QUEtiapine (SEROQUEL) tablet 50 mg  50 mg Oral QHS [name], NP   50 mg at 12/29/22 2134    Lab Results: No results found for this or any previous visit (from the past 48 hour(s)).  Blood Alcohol level:  Lab Results  Component Value Date   ETH <10 12/26/2022   ETH <10 Q000111Q    Metabolic Disorder Labs: No results found for: "HGBA1C", "MPG" No results found for: "PROLACTIN" No results found for: "CHOL", "TRIG", "HDL", "CHOLHDL", "VLDL", "LDLCALC"  Physical Findings: AIMS: Facial and Oral Movements Muscles of Facial Expression: None, normal Lips and Perioral Area: None, normal Jaw: None, normal Tongue: None, normal,Extremity Movements Upper (arms, wrists, hands, fingers): None, normal Lower (legs, knees, ankles, toes): None, normal, Trunk Movements Neck, shoulders, hips: None, normal, Overall Severity Severity of abnormal movements (highest score from questions above): None, normal Incapacitation due to abnormal movements: None, normal Patient's awareness of abnormal movements (rate only patient's report): No Awareness, Dental Status Current problems with teeth and/or dentures?: No Does patient usually wear dentures?: No  CIWA:    COWS:     Musculoskeletal: Strength & Muscle Tone: within normal limits Gait & Station: normal Patient leans: N/A  Psychiatric Specialty Exam:  Presentation  General Appearance: No data recorded Eye Contact:No data recorded Speech:No data recorded Speech Volume:No  data recorded Handedness:No data recorded  Mood and Affect  Mood:No data recorded Affect:No data recorded  Thought Process  Thought Processes:No data recorded Descriptions of Associations:No data recorded Orientation:No data recorded Thought Content:No data recorded History of Schizophrenia/Schizoaffective disorder:No  Duration of Psychotic Symptoms:No data recorded Hallucinations:No data recorded Ideas of Reference:No data recorded Suicidal Thoughts:No data recorded Homicidal Thoughts:No data recorded  Sensorium  Memory:No data recorded Judgment:No data recorded Insight:No data recorded  Executive Functions  Concentration:No data recorded Attention Span:No data recorded Recall:No data recorded Fund of Knowledge:No data recorded Language:No data recorded  Psychomotor Activity  Psychomotor Activity:No data recorded  Assets  Assets:No data recorded  Sleep  Sleep:No data recorded   Physical Exam: Physical Exam Vitals and nursing note reviewed.  Constitutional:      Appearance: Normal appearance.  HENT:     Head: Normocephalic and atraumatic.     Mouth/Throat:     Pharynx: Oropharynx is clear.  Eyes:     Pupils: Pupils are equal, round, and reactive to light.  Cardiovascular:     Rate and Rhythm: Normal rate and regular rhythm.  Pulmonary:     Effort: Pulmonary effort is normal.     Breath sounds: Normal breath sounds.  Abdominal:     General: Abdomen is flat.     Palpations: Abdomen is soft.  Musculoskeletal:        General: Normal range of motion.  Skin:    General: Skin is warm and dry.     Comments: Extensive excoriations on both forearms worse on the right.  The face is getting better.  All of it seems to be healing up nothing that appears to be infected  Neurological:     General: No focal deficit present.     Mental Status: She is alert. Mental status is at baseline.  Psychiatric:        Attention and Perception: She is inattentive.         Mood and Affect: Affect is blunt.        Speech: Speech is delayed.        Behavior: Behavior  is slowed.        Thought Content: Thought content normal.        Cognition and Memory: Memory is impaired.    Review of Systems  Constitutional:  Positive for malaise/fatigue.  HENT: Negative.    Eyes: Negative.   Respiratory: Negative.    Cardiovascular: Negative.   Gastrointestinal: Negative.   Musculoskeletal: Negative.   Skin: Negative.   Neurological: Negative.   Psychiatric/Behavioral:  Positive for depression. Negative for hallucinations and suicidal ideas.    Blood pressure (!) 88/55, pulse 77, temperature 98.8 F (37.1 C), temperature source Oral, resp. rate 16, height 4' 11"$  (1.499 m), weight 46.7 kg, last menstrual period 12/17/2022, SpO2 99 %, unknown if currently breastfeeding. Body mass index is 20.8 kg/m.   Treatment Plan Summary: Daily contact with patient to assess and evaluate symptoms and progress in treatment, Medication management, and Plan still recovering from extended periods of stimulant abuse.  Health stable.  No falls.  Cooperating with treatment.  No active dangerous behaviors still will need some time to recover before she can engage in discussion of follow-up treatment  Alethia Berthold, MD 12/30/2022, 4:00 PM

## 2022-12-30 NOTE — Plan of Care (Signed)
  Problem: Education: Goal: Knowledge of General Education information will improve Description: Including pain rating scale, medication(s)/side effects and non-pharmacologic comfort measures Outcome: Progressing   Problem: Nutrition: Goal: Adequate nutrition will be maintained Outcome: Progressing   Problem: Coping: Goal: Level of anxiety will decrease Outcome: Progressing   Problem: Education: Goal: Knowledge of [place] General Education information/materials will improve Outcome: Progressing Goal: Emotional status will improve Outcome: Progressing Goal: Mental status will improve Outcome: Progressing

## 2022-12-30 NOTE — Progress Notes (Signed)
Pt denies SI/HI/AVH and verbally agrees to approach staff if these become apparent or before harming themselves/others. Rates depression 5/10. Rates anxiety 5/10. Rates pain 6/10. Pt has been in her room all day. Pt will come out for rooms but stays in bed for the remanding of the day. Pt dressing changed at 1636. Pt tolerated well. Wound clean, dry with a small area of bleeding, and intact. Neosporin applied as ordered. Scheduled medications administered to pt, per MD orders. RN provided support and encouragement to pt. Q15 min safety checks implemented and continued. Pt safe on the unit. RN will continue to monitor and intervene as needed.  12/30/22 0811  Psych Admission Type (Psych Patients Only)  Admission Status Voluntary  Psychosocial Assessment  Patient Complaints Anxiety;Depression  Eye Contact Fair  Facial Expression Animated;Fixed smile  Affect Anxious;Depressed  Speech Logical/coherent  Interaction Assertive  Motor Activity Slow  Appearance/Hygiene Unremarkable;In scrubs  Behavior Characteristics Cooperative;Appropriate to situation;Calm  Mood Depressed;Anxious;Pleasant  Aggressive Behavior  Effect No apparent injury  Thought Process  Coherency WDL  Content WDL  Delusions None reported or observed  Perception WDL  Hallucination None reported or observed  Judgment Impaired  Confusion None  Danger to Self  Current suicidal ideation? Denies  Danger to Others  Danger to Others None reported or observed

## 2022-12-30 NOTE — Progress Notes (Signed)
Pt is calm and cooperative, isolative to room but compliant with medications.  Pt denies SI HI AVH.  Pt denies any physical complaints.  Continued monitoring for safety.    12/30/22 0630  Psych Admission Type (Psych Patients Only)  Admission Status Voluntary  Psychosocial Assessment  Patient Complaints Anxiety  Eye Contact Fair  Facial Expression Anxious  Affect Anxious  Speech Logical/coherent  Interaction Assertive  Motor Activity Slow  Appearance/Hygiene In scrubs  Behavior Characteristics Cooperative  Mood Depressed;Anxious;Pleasant  Thought Process  Coherency WDL  Content WDL  Delusions WDL  Perception WDL  Hallucination None reported or observed  Judgment Impaired  Confusion None  Danger to Self  Current suicidal ideation? Denies  Danger to Others  Danger to Others None reported or observed

## 2022-12-30 NOTE — Group Note (Signed)
Recreation Therapy Group Note   Group Topic:Coping Skills  Group Date: 12/30/2022 Start Time: 1000 End Time: 1035 Facilitators: Leona Carry, CTRS Location:  Craft Room  Group Description: Mind Map.  Patient was provided a blank template of a diagram with 32 blank boxes in a tiered system, branching from the center (similar to a bubble chart). LRT directed patients to label the middle of the diagram "Coping Skills". LRT and patients then came up with 8 different coping skills as examples. Pt were directed to record their coping skills in the 2nd tier boxes closest to the center.  Patients would then share their coping skills with the group as LRT wrote them out. LRT gave a handout of 100 different coping skills at the end of group.   Affect/Mood: Appropriate   Participation Level: Active and Engaged   Participation Quality: Independent   Behavior: Appropriate and Calm   Speech/Thought Process: Concrete, Coherent, and Directed   Insight: Good   Judgement: Good   Modes of Intervention: Activity and Worksheet   Patient Response to Interventions:  Attentive, Engaged, Interested , and Receptive   Education Outcome:  Acknowledges education   Clinical Observations/Individualized Feedback: Lori Pollard was active in their participation of session activities and group discussion. Pt identified "showering, cooking for others, plant flowers, reading the bible, and getting our nails done" as coping skills. Pt was the only one out of the group who interacted with LRT for most of the group. Pt was pleasant and face seemed to light up when discussing different coping skills. Pt was given a journal at the end of group, as she said writing is a coping skill she uses. Pt left group thanking LRT and said "this was a good group, thank you".   Plan: Continue to engage patient in RT group sessions 2-3x/week.   [name], LRT, CTRS  12/30/2022 10:59 AM

## 2022-12-30 NOTE — Group Note (Signed)
[facility] LCSW Group Therapy Note   Group Date: 12/30/2022 Start Time: 1300 End Time: 1400  Type of Therapy/Topic:  Group Therapy:  Feelings about Diagnosis  Participation Level:  Did Not Attend     Description of Group:    This group will allow patients to explore their thoughts and feelings about diagnoses they have received. Patients will be guided to explore their level of understanding and acceptance of these diagnoses. Facilitator will encourage patients to process their thoughts and feelings about the reactions of others to their diagnosis, and will guide patients in identifying ways to discuss their diagnosis with significant others in their lives. This group will be process-oriented, with patients participating in exploration of their own experiences as well as giving and receiving support and challenge from other group members.   Therapeutic Goals: 1. Patient will demonstrate understanding of diagnosis as evidence by identifying two or more symptoms of the disorder:  2. Patient will be able to express two feelings regarding the diagnosis 3. Patient will demonstrate ability to communicate their needs through discussion and/or role plays  Summary of Patient Progress: X   Therapeutic Modalities:   Cognitive Behavioral Therapy Brief Therapy Feelings Identification    Shirl Harris, LCSW

## 2022-12-31 ENCOUNTER — Other Ambulatory Visit: Payer: Self-pay

## 2022-12-31 DIAGNOSIS — F332 Major depressive disorder, recurrent severe without psychotic features: Secondary | ICD-10-CM | POA: Diagnosis not present

## 2022-12-31 MED ORDER — FERROUS SULFATE 325 (65 FE) MG PO TABS
325.0000 mg | ORAL_TABLET | Freq: Every day | ORAL | 0 refills | Status: AC
  Filled 2022-12-31: qty 30, 30d supply, fill #0

## 2022-12-31 MED ORDER — FLUOXETINE HCL 20 MG PO CAPS
20.0000 mg | ORAL_CAPSULE | Freq: Every day | ORAL | 0 refills | Status: AC
  Filled 2022-12-31: qty 30, 30d supply, fill #0

## 2022-12-31 MED ORDER — BACITRACIN-NEOMYCIN-POLYMYXIN OINTMENT TUBE
1.0000 | TOPICAL_OINTMENT | Freq: Two times a day (BID) | CUTANEOUS | 0 refills | Status: AC
  Filled 2022-12-31: qty 28.4, 14d supply, fill #0

## 2022-12-31 MED ORDER — QUETIAPINE FUMARATE 50 MG PO TABS: 50.0000 mg | ORAL_TABLET | Freq: Every day | ORAL | 1 refills | Status: DC

## 2022-12-31 MED ORDER — BUSPIRONE HCL 10 MG PO TABS
10.0000 mg | ORAL_TABLET | Freq: Three times a day (TID) | ORAL | 0 refills | Status: AC
  Filled 2022-12-31: qty 90, 30d supply, fill #0

## 2022-12-31 MED ORDER — GABAPENTIN 300 MG PO CAPS: 300.0000 mg | ORAL_CAPSULE | Freq: Three times a day (TID) | ORAL | 1 refills | Status: DC

## 2022-12-31 MED ORDER — LOPERAMIDE HCL 2 MG PO CAPS
2.0000 mg | ORAL_CAPSULE | ORAL | Status: DC | PRN
  Administered 2022-12-31: 2 mg via ORAL
  Filled 2022-12-31: qty 1

## 2022-12-31 MED ORDER — QUETIAPINE FUMARATE 50 MG PO TABS
50.0000 mg | ORAL_TABLET | Freq: Every day | ORAL | 0 refills | Status: AC
  Filled 2022-12-31: qty 30, 30d supply, fill #0

## 2022-12-31 MED ORDER — FERROUS SULFATE 325 (65 FE) MG PO TABS: 325.0000 mg | ORAL_TABLET | Freq: Every day | ORAL | 1 refills | Status: DC

## 2022-12-31 MED ORDER — BUSPIRONE HCL 10 MG PO TABS: 10.0000 mg | ORAL_TABLET | Freq: Three times a day (TID) | ORAL | 1 refills | Status: DC

## 2022-12-31 MED ORDER — GABAPENTIN 300 MG PO CAPS
300.0000 mg | ORAL_CAPSULE | Freq: Three times a day (TID) | ORAL | 0 refills | Status: AC
  Filled 2022-12-31: qty 45, 15d supply, fill #0

## 2022-12-31 MED ORDER — FLUOXETINE HCL 20 MG PO CAPS: 20.0000 mg | ORAL_CAPSULE | Freq: Every day | ORAL | 1 refills | Status: DC

## 2022-12-31 MED ORDER — BACITRACIN-NEOMYCIN-POLYMYXIN OINTMENT TUBE: 1.0000 | TOPICAL_OINTMENT | Freq: Two times a day (BID) | CUTANEOUS | 1 refills | Status: DC

## 2022-12-31 NOTE — Progress Notes (Signed)
   12/31/22 2200  Psych Admission Type (Psych Patients Only)  Admission Status Voluntary  Psychosocial Assessment  Patient Complaints None  Eye Contact Fair  Facial Expression Animated  Affect Appropriate to circumstance  Speech Logical/coherent  Interaction Assertive  Motor Activity Slow  Appearance/Hygiene Unremarkable  Behavior Characteristics Cooperative  Mood Pleasant  Thought Process  Coherency WDL  Content WDL  Delusions None reported or observed  Perception WDL  Hallucination None reported or observed  Judgment Limited  Confusion None  Danger to Self  Current suicidal ideation? Denies  Danger to Others  Danger to Others None reported or observed

## 2022-12-31 NOTE — Plan of Care (Signed)
  Problem: Education: Goal: Knowledge of General Education information will improve Description: Including pain rating scale, medication(s)/side effects and non-pharmacologic comfort measures Outcome: Progressing   Problem: Nutrition: Goal: Adequate nutrition will be maintained Outcome: Progressing   Problem: Coping: Goal: Level of anxiety will decrease Outcome: Progressing   Problem: Pain Managment: Goal: General experience of comfort will improve Outcome: Not Progressing   Problem: Safety: Goal: Ability to remain free from injury will improve Outcome: Progressing

## 2022-12-31 NOTE — Progress Notes (Signed)
[facility] MD Progress Note  12/31/2022 2:06 PM Lori Pollard  MRN:  [id number] Subjective: Patient seen and chart reviewed.  Patient sat up today and made good eye contact.  She told me she was feeling much better.  Not as sleepy as yesterday.  Today she tells me that she wants to be discharged back to her boyfriend's house.  I pointed out to her this was where she came from where she had been using drugs so much she essentially became delirious and stopped eating and sleeping.  Patient claims that the boyfriend is no longer using cocaine and that it will be a safe place.  Denies any suicidal thought.  No evidence of psychosis.  Wounds are healing up as expected Principal Problem: Major depressive disorder, recurrent severe without psychotic features ([place]) Diagnosis: Principal Problem:   Major depressive disorder, recurrent severe without psychotic features ([place]) Active Problems:   PTSD (post-traumatic stress disorder) dx'd age 32   Cocaine abuse ([place]) onset age 24 IV and smoke daily with last use 07/24/21; last use [id number]   Self-inflicted laceration of wrist ([place])   Anemia of chronic disease  Total Time spent with patient: 30 minutes  Past Psychiatric History: Past history of PTSD mood instability depression and substance abuse  Past Medical History:  Past Medical History:  Diagnosis Date   Anemia    Anxiety    Cervicalgia    Depression    Hepatitis    History of chickenpox    Hives    Mental disorder    Polysubstance abuse ([place])    Preterm labor    Vaginal Pap smear, abnormal     Past Surgical History:  Procedure Laterality Date   CESAREAN SECTION  2013   CESAREAN SECTION  2015   CESAREAN SECTION N/A 11/01/2019   Procedure: CESAREAN SECTION;  Surgeon: Homero Fellers, MD;  Location: [facility];  Service: Obstetrics;  Laterality: N/A;   DILATION AND EVACUATION N/A 03/18/2022   Procedure: DILATATION AND EVACUATION WITH JADA PLACEMENT;  Surgeon: Harlin Heys, MD;   Location: [facility];  Service: Gynecology;  Laterality: N/A;   FEMUR IM NAIL Left 06/23/2022   Procedure: INTRAMEDULLARY (IM) RETROGRADE FEMORAL NAILING;  Surgeon: Shona Needles, MD;  Location: [place];  Service: Orthopedics;  Laterality: Left;   INCISION AND DRAINAGE OF WOUND Right 06/25/2020   Procedure: IRRIGATION AND DEBRIDEMENT WOUND-Right Leg;  Surgeon: Leim Fabry, MD;  Location: [facility];  Service: Orthopedics;  Laterality: Right;   TIBIA IM NAIL INSERTION Left 06/23/2022   Procedure: INTRAMEDULLARY (IM) NAIL TIBIAL;  Surgeon: Shona Needles, MD;  Location: [place];  Service: Orthopedics;  Laterality: Left;   Family History:  Family History  Problem Relation Age of Onset   Alcohol abuse Mother    GER disease Mother    Depression Mother    Alcohol abuse Father    Emphysema Father    Cancer Maternal Grandmother    Diabetes Maternal Grandmother    Heart disease Maternal Grandmother    Hyperlipidemia Maternal Grandmother    Hypertension Maternal Grandmother    Kidney disease Maternal Grandmother    Lung cancer Maternal Grandmother    Family Psychiatric  History: See previous Social History:  Social History   Substance and Sexual Activity  Alcohol Use No   Alcohol/week: 0.0 standard drinks of alcohol     Social History   Substance and Sexual Activity  Drug Use Not Currently   Types: Cocaine, Methamphetamines, Heroin   Comment:  last cocaine use was 2 days ago (12/15/21), pt currently takes suboxone    Social History   Socioeconomic History   Marital status: Single    Spouse name: Not on file   Number of children: 3   Years of education: 9   Highest education level: GED or equivalent  Occupational History   Not on file  Tobacco Use   Smoking status: Former    Packs/day: 0.50    Years: 11.00    Total pack years: 5.50    Types: Cigarettes    Start date: 09/17/2004    Passive exposure: Past   Smokeless tobacco: Never  Vaping Use   Vaping Use: Never used  Substance  and Sexual Activity   Alcohol use: No    Alcohol/week: 0.0 standard drinks of alcohol   Drug use: Not Currently    Types: Cocaine, Methamphetamines, Heroin    Comment: last cocaine use was 2 days ago (12/15/21), pt currently takes suboxone   Sexual activity: Not Currently    Partners: Male    Birth control/protection: OCP, Surgical    Comment: BTL  Other Topics Concern   Not on file  Social History Narrative   Not on file   Social Determinants of Health   Financial Resource Strain: Medium Risk (09/09/2021)   Overall Financial Resource Strain (CARDIA)    Difficulty of Paying Living Expenses: Somewhat hard  Food Insecurity: No Food Insecurity (12/27/2022)   Hunger Vital Sign    Worried About Running Out of Food in the Last Year: Never true    Ran Out of Food in the Last Year: Never true  Transportation Needs: Unmet Transportation Needs (12/27/2022)   PRAPARE - Hydrologist (Medical): Yes    Lack of Transportation (Non-Medical): Yes  Physical Activity: Not on file  Stress: Not on file  Social Connections: Not on file   Additional Social History:                         Sleep: Fair  Appetite:  Fair  Current Medications: Current Facility-Administered Medications  Medication Dose Route Frequency Provider Last Rate Last Admin   acetaminophen (TYLENOL) tablet 650 mg  650 mg Oral Q6H PRN [name], NP   650 mg at 12/31/22 0813   alum & mag hydroxide-simeth (MAALOX/MYLANTA) 200-200-20 MG/5ML suspension 30 mL  30 mL Oral Q4H PRN [name], NP       busPIRone (BUSPAR) tablet 10 mg  10 mg Oral TID Adalynne Steffensmeier T, MD   10 mg at 12/31/22 1138   ferrous sulfate tablet 325 mg  325 mg Oral Q breakfast [name], NP   325 mg at 12/31/22 0813   FLUoxetine (PROZAC) capsule 20 mg  20 mg Oral Daily Chrys Landgrebe T, MD   20 mg at 12/31/22 0813   gabapentin (NEURONTIN) capsule 300 mg  300 mg Oral TID [name], NP   300 mg at 12/31/22  1138   ibuprofen (ADVIL) tablet 400 mg  400 mg Oral Q6H PRN Diani Jillson T, MD   400 mg at 12/30/22 2059   loperamide (IMODIUM) capsule 2 mg  2 mg Oral PRN Jahzaria Vary T, MD   2 mg at 12/31/22 1141   magnesium hydroxide (MILK OF MAGNESIA) suspension 30 mL  30 mL Oral Daily PRN [name], NP       neomycin-bacitracin-polymyxin (NEOSPORIN) ointment   Topical BID Magalie Almon,  [name], MD   Given at 12/30/22 1636   ondansetron (ZOFRAN-ODT) disintegrating tablet 4 mg  4 mg Oral Q8H PRN Syniah Berne, [name], MD   4 mg at 12/29/22 1444   phenylephrine-shark liver oil-mineral oil-petrolatum (PREPARATION H) rectal ointment 1 Application  1 Application Rectal BID PRN Garold Sheeler, [name], MD       QUEtiapine (SEROQUEL) tablet 50 mg  50 mg Oral QHS [name], NP   50 mg at 12/30/22 2059    Lab Results: No results found for this or any previous visit (from the past 48 hour(s)).  Blood Alcohol level:  Lab Results  Component Value Date   ETH <10 12/26/2022   ETH <10 [id number]    Metabolic Disorder Labs: No results found for: "HGBA1C", "MPG" No results found for: "PROLACTIN" No results found for: "CHOL", "TRIG", "HDL", "CHOLHDL", "VLDL", "LDLCALC"  Physical Findings: AIMS: Facial and Oral Movements Muscles of Facial Expression: None, normal Lips and Perioral Area: None, normal Jaw: None, normal Tongue: None, normal,Extremity Movements Upper (arms, wrists, hands, fingers): None, normal Lower (legs, knees, ankles, toes): None, normal, Trunk Movements Neck, shoulders, hips: None, normal, Overall Severity Severity of abnormal movements (highest score from questions above): None, normal Incapacitation due to abnormal movements: None, normal Patient's awareness of abnormal movements (rate only patient's report): No Awareness, Dental Status Current problems with teeth and/or dentures?: No Does patient usually wear dentures?: No  CIWA:    COWS:     Musculoskeletal: Strength & Muscle Tone: within  normal limits Gait & Station: normal Patient leans: N/A  Psychiatric Specialty Exam:  Presentation  General Appearance: No data recorded Eye Contact:No data recorded Speech:No data recorded Speech Volume:No data recorded Handedness:No data recorded  Mood and Affect  Mood:No data recorded Affect:No data recorded  Thought Process  Thought Processes:No data recorded Descriptions of Associations:No data recorded Orientation:No data recorded Thought Content:No data recorded History of Schizophrenia/Schizoaffective disorder:No  Duration of Psychotic Symptoms:No data recorded Hallucinations:No data recorded Ideas of Reference:No data recorded Suicidal Thoughts:No data recorded Homicidal Thoughts:No data recorded  Sensorium  Memory:No data recorded Judgment:No data recorded Insight:No data recorded  Executive Functions  Concentration:No data recorded Attention Span:No data recorded Recall:No data recorded Fund of Knowledge:No data recorded Language:No data recorded  Psychomotor Activity  Psychomotor Activity:No data recorded  Assets  Assets:No data recorded  Sleep  Sleep:No data recorded   Physical Exam: Physical Exam Vitals and nursing note reviewed.  Constitutional:      Appearance: Normal appearance.  HENT:     Head: Normocephalic and atraumatic.     Mouth/Throat:     Pharynx: Oropharynx is clear.  Eyes:     Pupils: Pupils are equal, round, and reactive to light.  Cardiovascular:     Rate and Rhythm: Normal rate and regular rhythm.  Pulmonary:     Effort: Pulmonary effort is normal.     Breath sounds: Normal breath sounds.  Abdominal:     General: Abdomen is flat.     Palpations: Abdomen is soft.  Musculoskeletal:        General: Normal range of motion.  Skin:    General: Skin is warm and dry.  Neurological:     General: No focal deficit present.     Mental Status: She is alert. Mental status is at baseline.  Psychiatric:        Attention and  Perception: Attention normal.        Mood and Affect: Mood normal.  Speech: Speech normal.        Behavior: Behavior is cooperative.        Thought Content: Thought content normal.        Cognition and Memory: Cognition normal.    Review of Systems  Constitutional: Negative.   HENT: Negative.    Eyes: Negative.   Respiratory: Negative.    Cardiovascular: Negative.   Gastrointestinal: Negative.   Musculoskeletal: Negative.   Skin: Negative.   Neurological: Negative.   Psychiatric/Behavioral: Negative.     Blood pressure 91/63, pulse 99, temperature 97.9 F (36.6 C), temperature source Oral, resp. rate 18, height 4' 11"$  (1.499 m), weight 46.7 kg, last menstrual period 12/17/2022, SpO2 100 %, unknown if currently breastfeeding. Body mass index is 20.8 kg/m.   Treatment Plan Summary: Medication management and Plan tried to encourage patient to be conscientious of how bad her situation was before coming in.  She does however now appear lucid and clear in her thinking not psychotic and is requesting discharge tomorrow.  If still requesting it we will consider discharge tomorrow although she has been encouraged to consider rehab.  She is now saying she wants to do outpatient substance abuse treatment  Alethia Berthold, MD 12/31/2022, 2:06 PM

## 2022-12-31 NOTE — Plan of Care (Signed)
  Problem: Education: Goal: Knowledge of General Education information will improve Description: Including pain rating scale, medication(s)/side effects and non-pharmacologic comfort measures Outcome: Progressing   Problem: Nutrition: Goal: Adequate nutrition will be maintained Outcome: Progressing   Problem: Coping: Goal: Level of anxiety will decrease Outcome: Progressing   Problem: Education: Goal: Knowledge of [place] General Education information/materials will improve Outcome: Progressing Goal: Emotional status will improve Outcome: Progressing Goal: Mental status will improve Outcome: Progressing

## 2022-12-31 NOTE — Group Note (Signed)
Recreation Therapy Group Note   Group Topic:Relaxation  Group Date: 12/31/2022 Start Time: 1000 End Time: 1045 Facilitators: [name], LRT, CTRS Location:  Craft Room  Group Description: PMR (Progressive Muscle Relaxation). LRT asks patients their current level of stress/anxiety from 1-10, with 10 being the highest. LRT educates patients on what PMR is and the benefits that come from it. Patients are asked to sit with their feet flat on the floor while sitting up and all the way back in their chair, if possible. LRT follows prompt that requires the patients to tense and release different muscles in their body and focus on their breathing. During session, lights are off and soft music is being played. At the end of the prompt, LRT asks patients to rank their current levels of stress/anxiety from 1-10, 10 being the highest.   Affect/Mood: Appropriate   Participation Level: Moderate   Participation Quality: Independent   Behavior: Appropriate   Speech/Thought Process: Concrete   Insight: Moderate   Judgement: Fair    Modes of Intervention: Activity and Education   Patient Response to Interventions:  Receptive   Education Outcome:  In group clarification offered    Clinical Observations/Individualized Feedback: [name] was somewhat active in their participation of session activities and group discussion. Pt attended group for half of the session. Pt got up in the middle of group and left quietly. Pt seemed to be fully engaged and interested in session before exiting the room. Pt took sheet with her as as she left.    Plan: Continue to engage patient in RT group sessions 2-3x/week.   [name], LRT, CTRS 12/31/2022 11:04 AM

## 2022-12-31 NOTE — Progress Notes (Signed)
Patient pleasant and cooperative. No complaints other than pain voiced. Denies SI, HI, AVH. Isolative to self and room. Prn given to patient for right arm pain. Gauze is clean dry and intact. Minimal interaction with staff and peers. Encouragement and support provided. Safety checks maintained. Medications given as prescribed. Pt receptive and remains safe on unit with q 15 min checks.

## 2022-12-31 NOTE — Progress Notes (Signed)
Pt denies SI/HI/AVH and verbally agrees to approach staff if these become apparent or before harming themselves/others. Rates depression 0/10. Rates anxiety 0/10. Rates pain 7/10. Pt has not complained of much pain in her arm today but has complained of diarrhea. The pt was given a PRN and has not reported any since. Pt has been in her room all day but did come out to go outside and for meals. Pt asked that her dressing be changed at night after she takes a shower. Scheduled medications administered to pt, per MD orders. RN provided support and encouragement to pt. Q15 min safety checks implemented and continued. Pt safe on the unit. RN will continue to monitor and intervene as needed.  12/31/22 0813  Psych Admission Type (Psych Patients Only)  Admission Status Voluntary  Psychosocial Assessment  Patient Complaints None  Eye Contact Fair  Facial Expression Other (Comment) (WDL)  Affect Appropriate to circumstance  Speech Logical/coherent  Interaction Assertive  Motor Activity Slow  Appearance/Hygiene Unremarkable;In scrubs  Behavior Characteristics Cooperative;Appropriate to situation;Calm  Mood Pleasant  Aggressive Behavior  Effect No apparent injury  Thought Process  Coherency WDL  Content WDL  Delusions None reported or observed  Perception WDL  Hallucination None reported or observed  Judgment Impaired  Confusion None  Danger to Self  Current suicidal ideation? Denies  Danger to Others  Danger to Others None reported or observed

## 2022-12-31 NOTE — Group Note (Signed)
[facility] LCSW Group Therapy Note   Group Date: 12/31/2022 Start Time: 1310 End Time: 1400   Type of Therapy/Topic:  Group Therapy:  Emotion Regulation  Participation Level:  Did Not Attend   Mood:  Description of Group:    The purpose of this group is to assist patients in learning to regulate negative emotions and experience positive emotions. Patients will be guided to discuss ways in which they have been vulnerable to their negative emotions. These vulnerabilities will be juxtaposed with experiences of positive emotions or situations, and patients challenged to use positive emotions to combat negative ones. Special emphasis will be placed on coping with negative emotions in conflict situations, and patients will process healthy conflict resolution skills.  Therapeutic Goals: Patient will identify two positive emotions or experiences to reflect on in order to balance out negative emotions:  Patient will label two or more emotions that they find the most difficult to experience:  Patient will be able to demonstrate positive conflict resolution skills through discussion or role plays:   Summary of Patient Progress:   X    Therapeutic Modalities:   Cognitive Behavioral Therapy Feelings Identification Dialectical Behavioral Therapy   Rozann Lesches, LCSW

## 2023-01-01 DIAGNOSIS — F332 Major depressive disorder, recurrent severe without psychotic features: Secondary | ICD-10-CM | POA: Diagnosis not present

## 2023-01-01 NOTE — Group Note (Signed)
Recreation Therapy Group Note   Group Topic:Healthy Decision Making  Group Date: 01/01/2023 Start Time: 1000 End Time: 1045 Facilitators: [name], LRT, CTRS Location:  Dayroom  Group Decision: [employer]. Patients and LRT tossed around a beach ball while listening to music. At random, LRT would stop the music and ask pt who was holding the ball at that time to answer a question: "Give me an example of a good decision, give me an example of a bad decision, what would a consequence of that decision be, are all consequences bad?" Pt's then filled out a Decision-Making Inventory to see what type of decision maker they are. LRT educated pts on different styles of decision making and wrote it on the board for all to see. LRT and pts shared their decision-making style with the group and processed on whether they thought it was an accurate representation of them or not.   Affect/Mood: N/A   Participation Level: Did not attend    Clinical Observations/Individualized Feedback: [name] did not attend group due to resting in their room.  Plan: Continue to engage patient in RT group sessions 2-3x/week.   [name], LRT, CTRS 01/01/2023 11:15 AM

## 2023-01-01 NOTE — Discharge Summary (Signed)
Physician Discharge Summary Note  Patient:  Lori Pollard is an 32 y.o., female MRN:  [id number] DOB:  1991/10/01 Patient phone:  [phone] (home)  Patient address:   [address] [place] [address],  Total Time spent with patient: 30 minutes  Date of Admission:  12/27/2022 Date of Discharge: 01/01/2023  Reason for Admission: Patient was admitted because of injuries to her arm confusion substance abuse mood instability poor self-care.  Principal Problem: Major depressive disorder, recurrent severe without psychotic features [facility]) Discharge Diagnoses: Principal Problem:   Major depressive disorder, recurrent severe without psychotic features ([place]) Active Problems:   PTSD (post-traumatic stress disorder) dx'd age 64   Cocaine abuse ([place]) onset age 84 IV and smoke daily with last use 07/24/21; last use 123XX123   Self-inflicted laceration of wrist ([place])   Anemia of chronic disease   Past Psychiatric History: Has a history of major depression and PTSD with a history of trauma in the past also history of cocaine abuse with symptoms of delirium or psychosis from heavy stimulant abuse.  History of self-injury.  Past Medical History:  Past Medical History:  Diagnosis Date   Anemia    Anxiety    Cervicalgia    Depression    Hepatitis    History of chickenpox    Hives    Mental disorder    Polysubstance abuse ([place])    Preterm labor    Vaginal Pap smear, abnormal     Past Surgical History:  Procedure Laterality Date   CESAREAN SECTION  2013   CESAREAN SECTION  2015   CESAREAN SECTION N/A 11/01/2019   Procedure: CESAREAN SECTION;  Surgeon: Homero Fellers, MD;  Location: [facility] ORS;  Service: Obstetrics;  Laterality: N/A;   DILATION AND EVACUATION N/A 03/18/2022   Procedure: DILATATION AND EVACUATION WITH JADA PLACEMENT;  Surgeon: Harlin Heys, MD;  Location: [facility] ORS;  Service: Gynecology;  Laterality: N/A;   FEMUR IM NAIL Left 06/23/2022   Procedure:  INTRAMEDULLARY (IM) RETROGRADE FEMORAL NAILING;  Surgeon: Shona Needles, MD;  Location: [place];  Service: Orthopedics;  Laterality: Left;   INCISION AND DRAINAGE OF WOUND Right 06/25/2020   Procedure: IRRIGATION AND DEBRIDEMENT WOUND-Right Leg;  Surgeon: Leim Fabry, MD;  Location: [facility] ORS;  Service: Orthopedics;  Laterality: Right;   TIBIA IM NAIL INSERTION Left 06/23/2022   Procedure: INTRAMEDULLARY (IM) NAIL TIBIAL;  Surgeon: Shona Needles, MD;  Location: [place];  Service: Orthopedics;  Laterality: Left;   Family History:  Family History  Problem Relation Age of Onset   Alcohol abuse Mother    GER disease Mother    Depression Mother    Alcohol abuse Father    Emphysema Father    Cancer Maternal Grandmother    Diabetes Maternal Grandmother    Heart disease Maternal Grandmother    Hyperlipidemia Maternal Grandmother    Hypertension Maternal Grandmother    Kidney disease Maternal Grandmother    Lung cancer Maternal Grandmother    Family Psychiatric  History: See previous. Social History:  Social History   Substance and Sexual Activity  Alcohol Use No   Alcohol/week: 0.0 standard drinks of alcohol     Social History   Substance and Sexual Activity  Drug Use Not Currently   Types: Cocaine, Methamphetamines, Heroin   Comment: last cocaine use was 2 days ago (12/15/21), pt currently takes suboxone    Social History   Socioeconomic History   Marital status: Single    Spouse name:  Not on file   Number of children: 3   Years of education: 9   Highest education level: GED or equivalent  Occupational History   Not on file  Tobacco Use   Smoking status: Former    Packs/day: 0.50    Years: 11.00    Total pack years: 5.50    Types: Cigarettes    Start date: 09/17/2004    Passive exposure: Past   Smokeless tobacco: Never  Vaping Use   Vaping Use: Never used  Substance and Sexual Activity   Alcohol use: No    Alcohol/week: 0.0 standard drinks of alcohol   Drug use: Not  Currently    Types: Cocaine, Methamphetamines, Heroin    Comment: last cocaine use was 2 days ago (12/15/21), pt currently takes suboxone   Sexual activity: Not Currently    Partners: Male    Birth control/protection: OCP, Surgical    Comment: BTL  Other Topics Concern   Not on file  Social History Narrative   Not on file   Social Determinants of Health   Financial Resource Strain: Medium Risk (09/09/2021)   Overall Financial Resource Strain (CARDIA)    Difficulty of Paying Living Expenses: Somewhat hard  Food Insecurity: No Food Insecurity (12/27/2022)   Hunger Vital Sign    Worried About Running Out of Food in the Last Year: Never true    Ran Out of Food in the Last Year: Never true  Transportation Needs: Unmet Transportation Needs (12/27/2022)   PRAPARE - Hydrologist (Medical): Yes    Lack of Transportation (Non-Medical): Yes  Physical Activity: Not on file  Stress: Not on file  Social Connections: Not on file    Hospital Course: Patient admitted to psychiatric unit.  15-minute checks continued.  Patient was not exhibiting actively dangerous behaviors in the hospital and was cooperative with care for her wounds on her arm and face.  She was cooperative with meeting with treatment team and discussing and taking appropriate medication.  Patient was offered the opportunity of considering inpatient rehab programs but states that now that she prefers to return to stay with her boyfriend.  She appears to understand the risks involved in that and states that she plans to stay sober and will follow-up with [facility].  Patient will be discharged and will be given prescriptions and supplies of medication and referral to local mental health agency.  Physical Findings: AIMS: Facial and Oral Movements Muscles of Facial Expression: None, normal Lips and Perioral Area: None, normal Jaw: None, normal Tongue: None, normal,Extremity Movements Upper (arms, wrists, hands,  fingers): None, normal Lower (legs, knees, ankles, toes): None, normal, Trunk Movements Neck, shoulders, hips: None, normal, Overall Severity Severity of abnormal movements (highest score from questions above): None, normal Incapacitation due to abnormal movements: None, normal Patient's awareness of abnormal movements (rate only patient's report): No Awareness, Dental Status Current problems with teeth and/or dentures?: No Does patient usually wear dentures?: No  CIWA:    COWS:     Musculoskeletal: Strength & Muscle Tone: within normal limits Gait & Station: normal Patient leans: N/A   Psychiatric Specialty Exam:  Presentation  General Appearance: No data recorded Eye Contact:No data recorded Speech:No data recorded Speech Volume:No data recorded Handedness:No data recorded  Mood and Affect  Mood:No data recorded Affect:No data recorded  Thought Process  Thought Processes:No data recorded Descriptions of Associations:No data recorded Orientation:No data recorded Thought Content:No data recorded History of Schizophrenia/Schizoaffective disorder:No  Duration of  Psychotic Symptoms:No data recorded Hallucinations:No data recorded Ideas of Reference:No data recorded Suicidal Thoughts:No data recorded Homicidal Thoughts:No data recorded  Sensorium  Memory:No data recorded Judgment:No data recorded Insight:No data recorded  Executive Functions  Concentration:No data recorded Attention Span:No data recorded Recall:No data recorded [employer] [place] recorded Language:No data recorded  Psychomotor Activity  Psychomotor Activity:No data recorded  Assets  Assets:No data recorded  Sleep  Sleep:No data recorded   Physical Exam: Physical Exam Vitals and nursing note reviewed.  Constitutional:      Appearance: Normal appearance.  HENT:     Head: Normocephalic and atraumatic.     Mouth/Throat:     Pharynx: Oropharynx is clear.  Eyes:     Pupils:  Pupils are equal, round, and reactive to light.  Cardiovascular:     Rate and Rhythm: Normal rate and regular rhythm.  Pulmonary:     Effort: Pulmonary effort is normal.     Breath sounds: Normal breath sounds.  Abdominal:     General: Abdomen is flat.     Palpations: Abdomen is soft.  Musculoskeletal:        General: Normal range of motion.  Skin:    General: Skin is warm and dry.     Comments: Skin lesions are all healing up appropriately as would be expected with no sign of infection.  Neurological:     General: No focal deficit present.     Mental Status: She is alert. Mental status is at baseline.  Psychiatric:        Attention and Perception: Attention normal.        Mood and Affect: Mood normal.        Speech: Speech normal.        Behavior: Behavior normal.        Thought Content: Thought content normal.        Cognition and Memory: Cognition normal.        Judgment: Judgment normal.    Review of Systems  Constitutional: Negative.   HENT: Negative.    Eyes: Negative.   Respiratory: Negative.    Cardiovascular: Negative.   Gastrointestinal: Negative.   Musculoskeletal: Negative.   Skin: Negative.   Neurological: Negative.   Psychiatric/Behavioral: Negative.     Blood pressure 93/67, pulse 86, temperature 97.8 F (36.6 C), temperature source Oral, resp. rate 18, height 4' 11"$  (1.499 m), weight 46.7 kg, last menstrual period 12/17/2022, SpO2 100 %, unknown if currently breastfeeding. Body mass index is 20.8 kg/m.   Social History   Tobacco Use  Smoking Status Former   Packs/day: 0.50   Years: 11.00   Total pack years: 5.50   Types: Cigarettes   Start date: 09/17/2004   Passive exposure: Past  Smokeless Tobacco Never   Tobacco Cessation:  A prescription for an FDA-approved tobacco cessation medication provided at discharge   Blood Alcohol level:  Lab Results  Component Value Date   ETH <10 12/26/2022   ETH <10 [id number]    Metabolic Disorder Labs:   No results found for: "HGBA1C", "MPG" No results found for: "PROLACTIN" No results found for: "CHOL", "TRIG", "HDL", "CHOLHDL", "VLDL", "[place]"  See Psychiatric Specialty Exam and Suicide Risk Assessment completed by Attending Physician prior to discharge.  Discharge destination:  Home  Is patient on multiple antipsychotic therapies at discharge:  No   Has Patient had three or more failed trials of antipsychotic monotherapy by history:  No  Recommended Plan for Multiple Antipsychotic Therapies: NA  Discharge Instructions  Diet - low sodium heart healthy   Complete by: As directed    Increase activity slowly   Complete by: As directed       Allergies as of 01/01/2023   No Known Allergies      Medication List     STOP taking these medications    atomoxetine 25 MG capsule Commonly known as: STRATTERA   ibuprofen 200 MG tablet Commonly known as: ADVIL   Mavyret 100-40 MG Tabs Generic drug: Glecaprevir-Pibrentasvir   methocarbamol 500 MG tablet Commonly known as: ROBAXIN       TAKE these medications      Indication  busPIRone 10 MG tablet Commonly known as: BUSPAR Take 1 tablet (10 mg total) by mouth 3 (three) times daily. What changed:  medication strength how much to take when to take this  Indication: Anxiety Disorder, Major Depressive Disorder   desogestrel-ethinyl estradiol 0.15-0.02/0.01 MG (21/5) tablet Commonly known as: Mircette Take 1 tablet by mouth at bedtime.  Indication: Birth Control Treatment   FeroSul 325 (65 FE) MG tablet Generic drug: ferrous sulfate Take 1 tablet (325 mg total) by mouth daily with breakfast.  Indication: Anemia From Inadequate Iron in the Body   FLUoxetine 20 MG capsule Commonly known as: PROZAC Take 1 capsule (20 mg total) by mouth daily.  Indication: Depression   gabapentin 300 MG capsule Commonly known as: NEURONTIN Take 1 capsule (300 mg total) by mouth 3 (three) times daily.  Indication:  Generalized Anxiety Disorder   neomycin-bacitracin-polymyxin [phone] Oint Apply 1 Application topically 2 (two) times daily.  Indication: Skin wound   QUEtiapine 50 MG tablet Commonly known as: SEROQUEL Take 1 tablet (50 mg total) by mouth at bedtime.  Indication: Major Depressive Disorder         Follow-up recommendations:  Other:  Patient has been counseled about substance abuse issues and mental health issues and encouraged to follow-up with appropriate treatment.  Discharged today to outpatient care.  Comments: See above.  Signed: Alethia Berthold, MD 01/01/2023, 9:30 AM

## 2023-01-01 NOTE — Progress Notes (Signed)
Patient discharged with all paperwork and belongings. Patient was given medication samples, her paper scripts, transition record, AVS and all applicable paperwork. Survey was completed. Suicide safety plan completed and copy in folder. Given the opportunity to ask questions but patient did not have any questions. Discharged to the care of her husband.

## 2023-01-01 NOTE — Plan of Care (Signed)
  Problem: Education: Goal: Knowledge of General Education information will improve Description: Including pain rating scale, medication(s)/side effects and non-pharmacologic comfort measures Outcome: Completed/Met   Problem: Health Behavior/Discharge Planning: Goal: Ability to manage health-related needs will improve Outcome: Completed/Met   Problem: Clinical Measurements: Goal: Ability to maintain clinical measurements within normal limits will improve Outcome: Completed/Met Goal: Will remain free from infection Outcome: Completed/Met Goal: Diagnostic test results will improve Outcome: Completed/Met Goal: Respiratory complications will improve Outcome: Completed/Met Goal: Cardiovascular complication will be avoided Outcome: Completed/Met   Problem: Activity: Goal: Risk for activity intolerance will decrease Outcome: Completed/Met   Problem: Nutrition: Goal: Adequate nutrition will be maintained Outcome: Completed/Met   Problem: Coping: Goal: Level of anxiety will decrease Outcome: Completed/Met   Problem: Elimination: Goal: Will not experience complications related to bowel motility Outcome: Completed/Met Goal: Will not experience complications related to urinary retention Outcome: Completed/Met   Problem: Pain Managment: Goal: General experience of comfort will improve Outcome: Completed/Met   Problem: Safety: Goal: Ability to remain free from injury will improve Outcome: Completed/Met   Problem: Skin Integrity: Goal: Risk for impaired skin integrity will decrease Outcome: Completed/Met   Problem: Education: Goal: Knowledge of [place] General Education information/materials will improve Outcome: Completed/Met Goal: Emotional status will improve Outcome: Completed/Met Goal: Mental status will improve Outcome: Completed/Met Goal: Verbalization of understanding the information provided will improve Outcome: Completed/Met   Problem: Safety: Goal:  Periods of time without injury will increase Outcome: Completed/Met   Problem: Health Behavior/Discharge Planning: Goal: Identification of resources available to assist in meeting health care needs will improve Outcome: Completed/Met   Problem: Self-Concept: Goal: Ability to disclose and discuss suicidal ideas will improve Outcome: Completed/Met Goal: Will verbalize positive feelings about self Outcome: Completed/Met

## 2023-01-01 NOTE — BHH Suicide Risk Assessment (Signed)
[facility] Discharge Suicide Risk Assessment   Principal Problem: Major depressive disorder, recurrent severe without psychotic features [facility]) Discharge Diagnoses: Principal Problem:   Major depressive disorder, recurrent severe without psychotic features ([place]) Active Problems:   PTSD (post-traumatic stress disorder) dx'd age 32   Cocaine abuse ([place]) onset age 52 IV and smoke daily with last use 07/24/21; last use [id number]   Self-inflicted laceration of wrist ([place])   Anemia of chronic disease   Total Time spent with patient: 30 minutes  Musculoskeletal: Strength & Muscle Tone: within normal limits Gait & Station: normal Patient leans: N/A  Psychiatric Specialty Exam  Presentation  General Appearance: No data recorded Eye Contact:No data recorded Speech:No data recorded Speech Volume:No data recorded Handedness:No data recorded  Mood and Affect  Mood:No data recorded Duration of Depression Symptoms: No data recorded Affect:No data recorded  Thought Process  Thought Processes:No data recorded Descriptions of Associations:No data recorded Orientation:No data recorded Thought Content:No data recorded History of Schizophrenia/Schizoaffective disorder:No  Duration of Psychotic Symptoms:No data recorded Hallucinations:No data recorded Ideas of Reference:No data recorded Suicidal Thoughts:No data recorded Homicidal Thoughts:No data recorded  Sensorium  Memory:No data recorded Judgment:No data recorded Insight:No data recorded  Executive Functions  Concentration:No data recorded Attention Span:No data recorded Recall:No data recorded Fund of Knowledge:No data recorded Language:No data recorded  Psychomotor Activity  Psychomotor Activity:No data recorded  Assets  Assets:No data recorded  Sleep  Sleep:No data recorded  Physical Exam: Physical Exam Vitals and nursing note reviewed.  Constitutional:      Appearance: Normal appearance.  HENT:     Head: Normocephalic  and atraumatic.     Mouth/Throat:     Pharynx: Oropharynx is clear.  Eyes:     Pupils: Pupils are equal, round, and reactive to light.  Cardiovascular:     Rate and Rhythm: Normal rate and regular rhythm.  Pulmonary:     Effort: Pulmonary effort is normal.     Breath sounds: Normal breath sounds.  Abdominal:     General: Abdomen is flat.     Palpations: Abdomen is soft.  Musculoskeletal:        General: Normal range of motion.  Skin:    General: Skin is warm and dry.     Comments: Forearms are healing up well.  No sign of infection  Neurological:     General: No focal deficit present.     Mental Status: She is alert. Mental status is at baseline.  Psychiatric:        Attention and Perception: Attention normal.        Mood and Affect: Mood normal.        Speech: Speech normal.        Behavior: Behavior is cooperative.        Thought Content: Thought content normal.        Cognition and Memory: Cognition normal.    Review of Systems  Constitutional: Negative.   HENT: Negative.    Eyes: Negative.   Respiratory: Negative.    Cardiovascular: Negative.   Gastrointestinal: Negative.   Musculoskeletal: Negative.   Skin: Negative.   Neurological: Negative.   Psychiatric/Behavioral: Negative.     Blood pressure 93/67, pulse 86, temperature 97.8 F (36.6 C), temperature source Oral, resp. rate 18, height 4' 11"$  (1.499 m), weight 46.7 kg, last menstrual period 12/17/2022, SpO2 100 %, unknown if currently breastfeeding. Body mass index is 20.8 kg/m.  Mental Status Per Nursing Assessment::   On Admission:  Self-harm behaviors, Self-harm thoughts  Demographic Factors:  Caucasian and Low socioeconomic status  Loss Factors: Decline in physical health, Legal issues, and Financial problems/change in socioeconomic status  Historical Factors: Impulsivity  Risk Reduction Factors:   Living with another person, especially a relative  Continued Clinical Symptoms:  Depression:    Comorbid alcohol abuse/dependence Alcohol/Substance Abuse/Dependencies  Cognitive Features That Contribute To Risk:  None    Suicide Risk:  Minimal: No identifiable suicidal ideation.  Patients presenting with no risk factors but with morbid ruminations; may be classified as minimal risk based on the severity of the depressive symptoms    Plan Of Care/Follow-up recommendations:  Other:  Patient denies suicidal ideation.  Affect is smiling upbeat and calm.  Appears much more lucid and clear in her thinking with improved insight.  Understands her options and is requesting discharge with follow-up with [facility].  Alethia Berthold, MD 01/01/2023, 9:21 AM

## 2023-01-01 NOTE — Progress Notes (Signed)
  [facility] Adult Case Management Discharge Plan :  Will you be returning to the same living situation after discharge:  Yes,  pt reports that she is returning home.  At discharge, do you have transportation home?: Yes,  pt reports that family will provide transportation.  Do you have the ability to pay for your medications: No.  Release of information consent forms completed and in the chart;  Patient's signature needed at discharge.  Patient to Follow up at:  Follow-up Information     [place] Follow up.   Why: Appointment is scheduled for 01/05/2023 at [place] information: [place] [address] [phone]                 Next level of care provider has access to [place] and Suicide Prevention discussed: Yes,  SPE completed with the patient.      Has patient been referred to the Quitline?: Patient refused referral  Patient has been referred for addiction treatment: Pt. refused referral  Rozann Lesches, LCSW 01/01/2023, 9:59 AM

## 2023-01-01 NOTE — Progress Notes (Signed)
D: Patient alert and oriented, able to make needs known. Denies SI/HI, AVH at present. Denies pain at present. Patient goal today "outpatient treatment." Rates depression 5/10, hopelessness 3/10, and anxiety 5/10. Patient reports energy level as normal. She reports she slept well last night. Patient does not request any PRN medication at this time.   A: Scheduled medications administered to patient per MD order. Support and encouragement provided. Routine safety checks conducted every fifteen minutes. Patient informed to notify staff with problems or concerns. Frequent verbal contact made.   R: No adverse drug reactions noted. Patient contracts for safety at this time. Patient is compliant with medications and treatment plan. Patient receptive, calm and cooperative. Patient interacts with others appropriately on unit at present. Patient remains safe at present.

## 2023-01-04 ENCOUNTER — Other Ambulatory Visit: Payer: Self-pay

## 2023-01-04 ENCOUNTER — Emergency Department
Admission: EM | Admit: 2023-01-04 | Discharge: 2023-01-04 | Discharge status: Against Medical Advice | Payer: Self-pay | Attending: Emergency Medicine | Admitting: Emergency Medicine

## 2023-01-04 DIAGNOSIS — Z5321 Procedure and treatment not carried out due to patient leaving prior to being seen by health care provider: Secondary | ICD-10-CM | POA: Insufficient documentation

## 2023-01-04 DIAGNOSIS — H02843 Edema of right eye, unspecified eyelid: Secondary | ICD-10-CM | POA: Insufficient documentation

## 2023-01-04 DIAGNOSIS — H5711 Ocular pain, right eye: Secondary | ICD-10-CM | POA: Insufficient documentation

## 2023-01-04 DIAGNOSIS — H5789 Other specified disorders of eye and adnexa: Secondary | ICD-10-CM | POA: Insufficient documentation

## 2023-01-04 NOTE — ED Notes (Signed)
Pt to the front desk stating that she is going to go to [facility] to see if she cannot be seen there. Patient states that she will come back to the ED if unable to be [facility].  Pt ambulatory out of the ED without difficulty or distress. Pt able to speak in complete sentences and color is WNL.

## 2023-01-04 NOTE — ED Triage Notes (Signed)
Pt via POV from home. Pt c/o R eye pain, swelling, and  redness states that the pain started yesterday. Pt is A&Ox4 and NAD. Ambulatory to triage.

## 2023-01-20 IMAGING — CT CT CERVICAL SPINE W/O CM
3 of 4 series · 12 of 33 positions shown, 14 images · non-contrast
Comparison: None Available.

CLINICAL DATA: Head trauma.  Fell on floor.



[Series 7: sagittal bone · sagittal · 0.22mm/px · 5 of 84 slices shown, 6 images]
[im 28/84  bone]
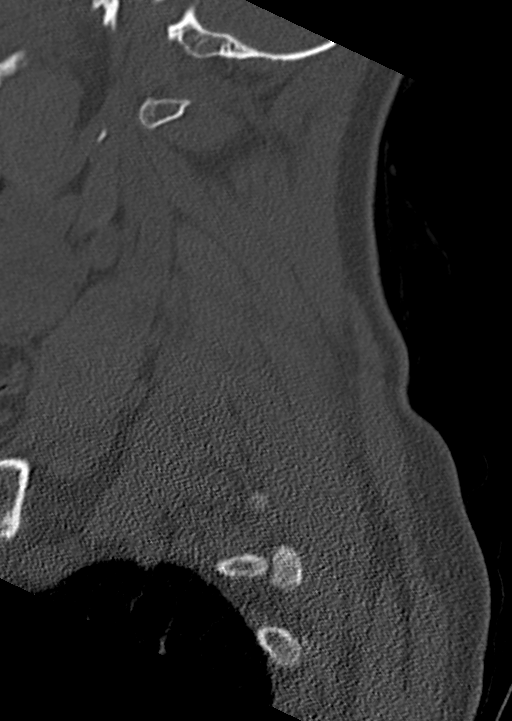
[im 35/84  bone]
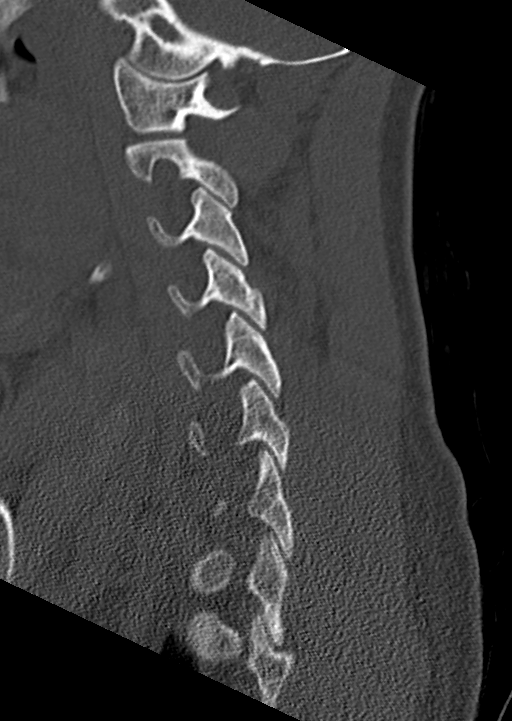
[im 42/84  soft-tissue]
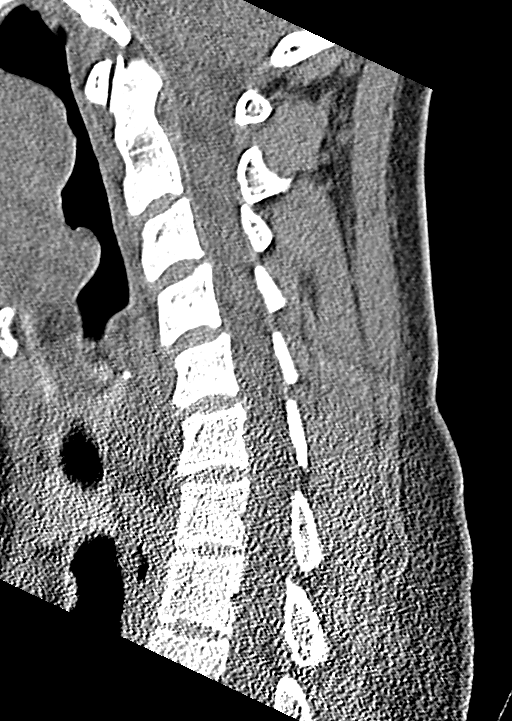
[im 42/84  bone]
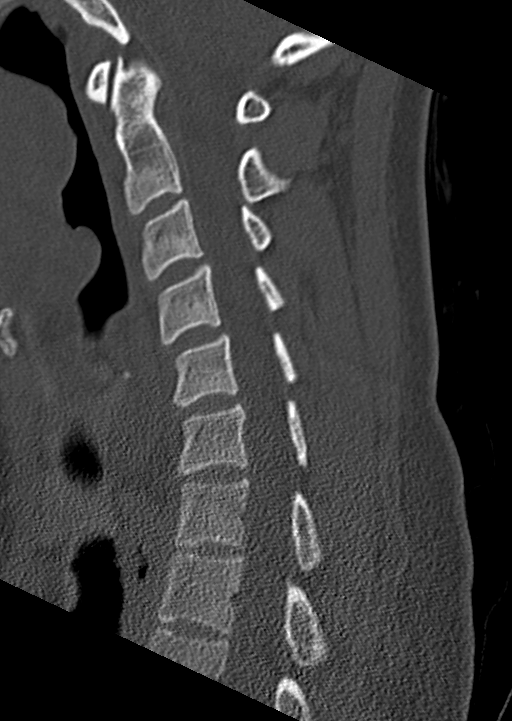
[im 49/84  bone]
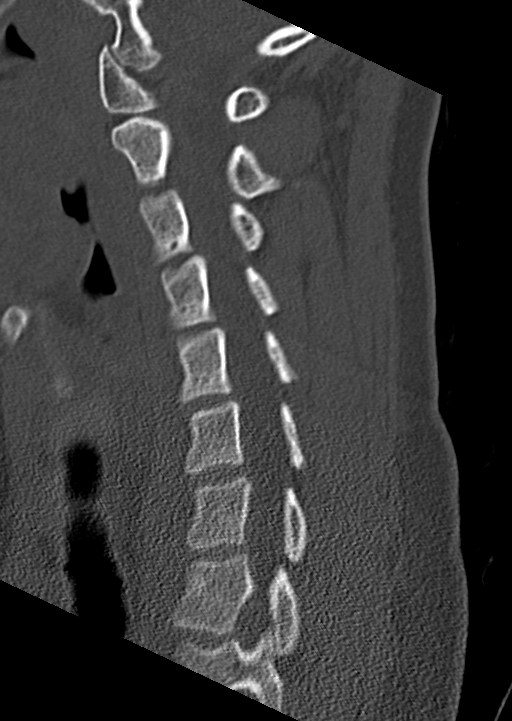
[im 56/84  bone]
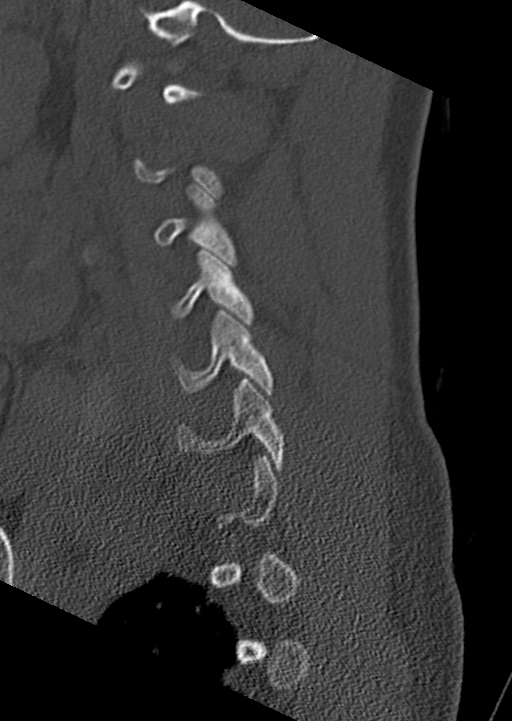

[Series 8: coronal bone · coronal · 0.32mm/px · 3 of 58 slices shown]
[im 12/58  bone]
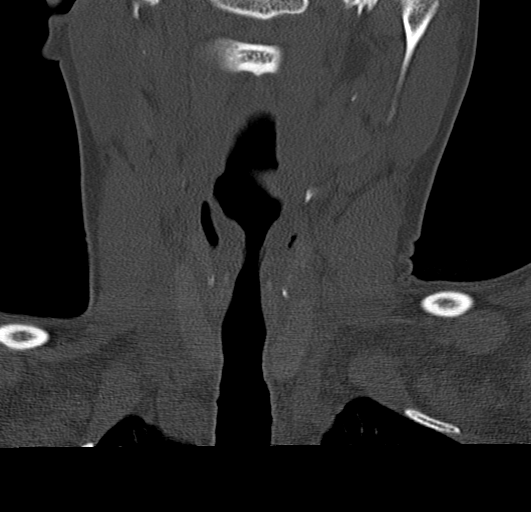
[im 23/58  bone]
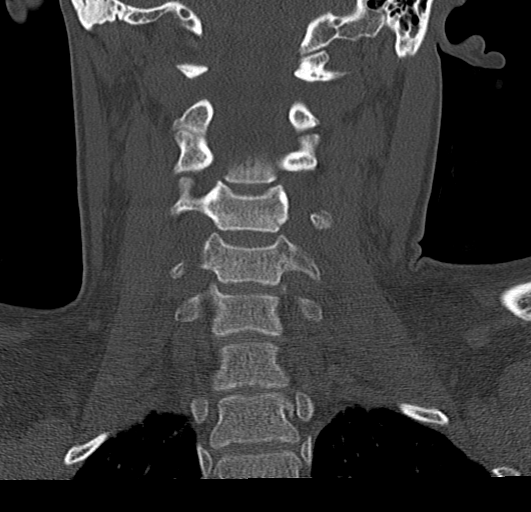
[im 35/58  bone]
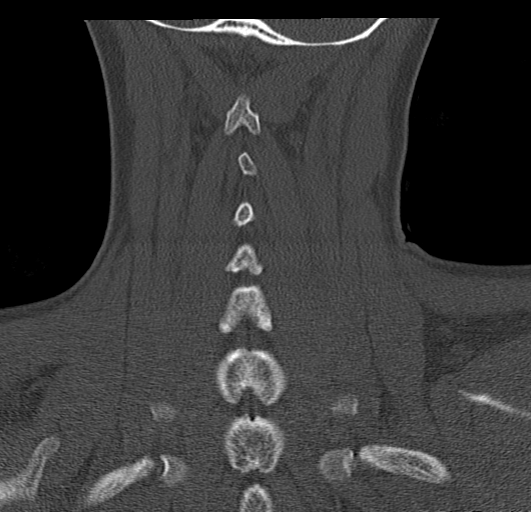

[Series 9: orthogonal bone · axial · 0.22mm/px · z∈[-328,-223]mm · 4 of 82 slices shown, 5 images]
[im 12/82  soft-tissue]
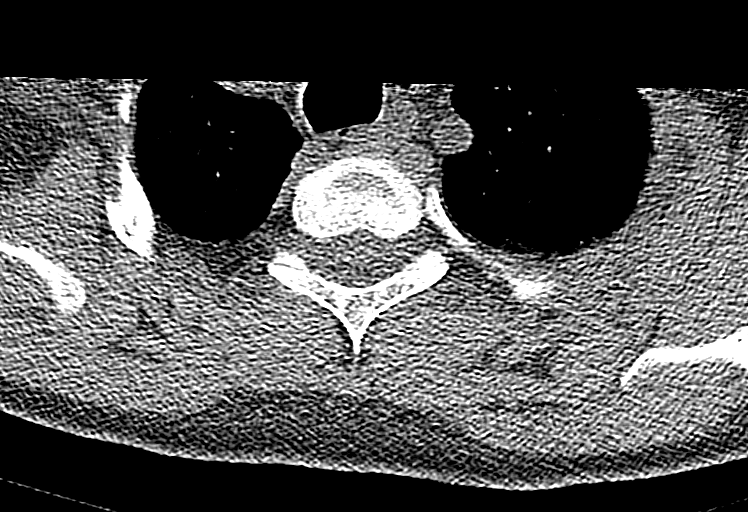
[im 12/82  bone]
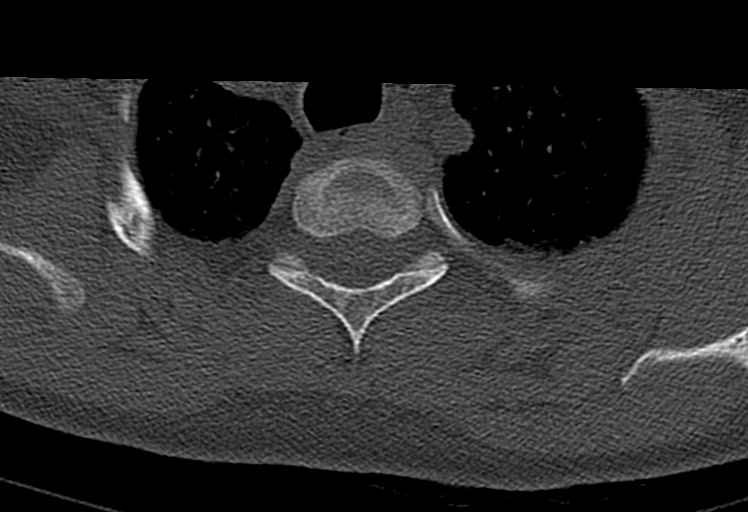
[im 35/82  bone]
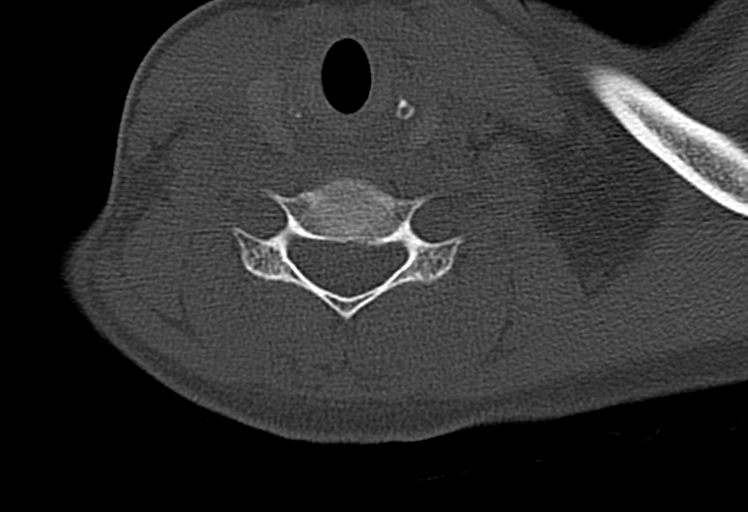
[im 47/82  bone]
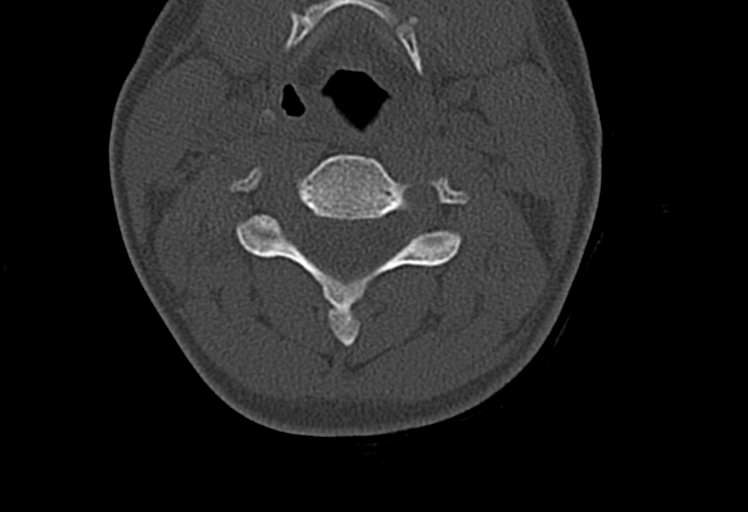
[im 70/82  bone]
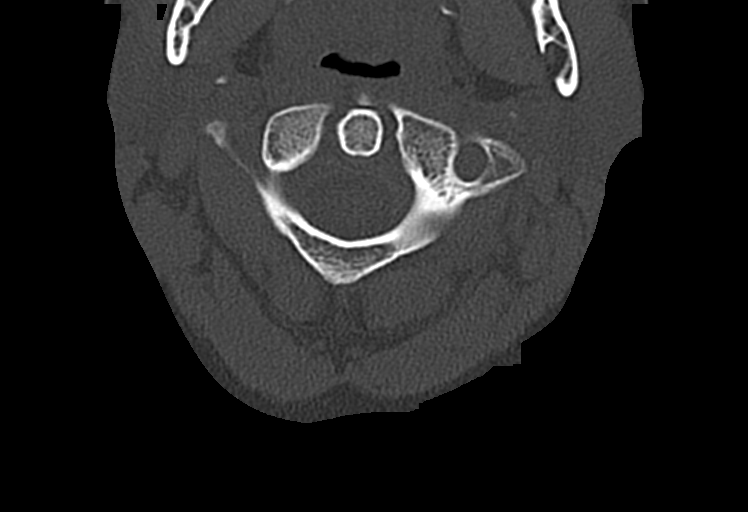

[12 of 33 positions shown; findings below may reference images not displayed]

FINDINGS: CT HEAD FINDINGS

Brain: No evidence of acute infarction, hemorrhage, hydrocephalus,
extra-axial collection or mass lesion/mass effect.

Vascular: No hyperdense vessel or unexpected calcification.

Skull: Normal. Negative for fracture or focal lesion.

Sinuses/Orbits: No acute finding.

Other: None

CT CERVICAL SPINE FINDINGS

Alignment: There is reversal of normal cervical lordosis which
likely reflects patient positioning. No posttraumatic malalignment.

Skull base and vertebrae: No acute fracture. No primary bone lesion
or focal pathologic process.

Soft tissues and spinal canal: No prevertebral fluid or swelling. No
visible canal hematoma.

Disc levels: The vertebral body heights and disc spaces are well
preserved. No significant spondylosis.

Upper chest: Negative.

Other: None
IMPRESSION: 1. No acute intracranial abnormalities.
2. No evidence for cervical spine fracture or posttraumatic
malalignment.
3. Reversal of normal cervical lordosis which likely reflects
patient positioning.

## 2023-01-20 IMAGING — US US PELVIS COMPLETE
1 series · 14 of 25 positions shown · non-contrast
Comparison: 01/07/2022

CLINICAL DATA: Heavy vaginal bleeding. Cesarian section 02/21/2022
and D and C 03/04/2022

EXAM:
TRANSABDOMINAL ULTRASOUND OF PELVIS
TECHNIQUE: Transabdominal ultrasound examination of the pelvis was performed
including evaluation of the uterus, ovaries, adnexal regions, and
pelvic cul-de-sac.

[Series 1: us ob transvaginal · 14 of 58 slices shown]
[im 1/58]
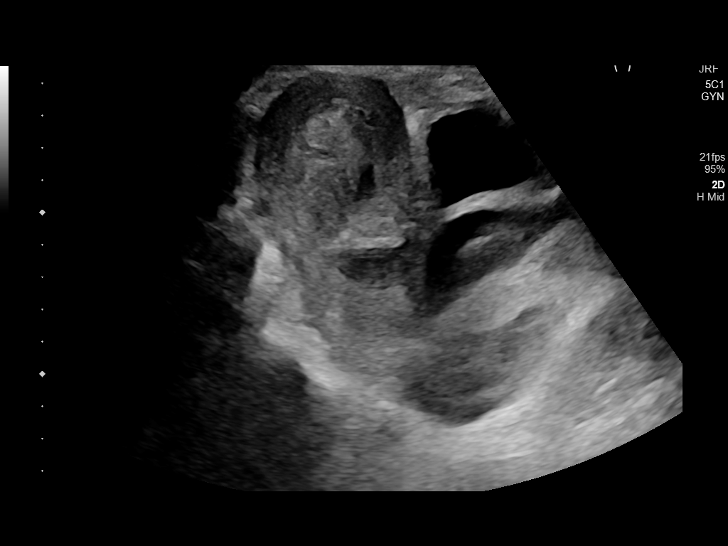
[im 5/58]
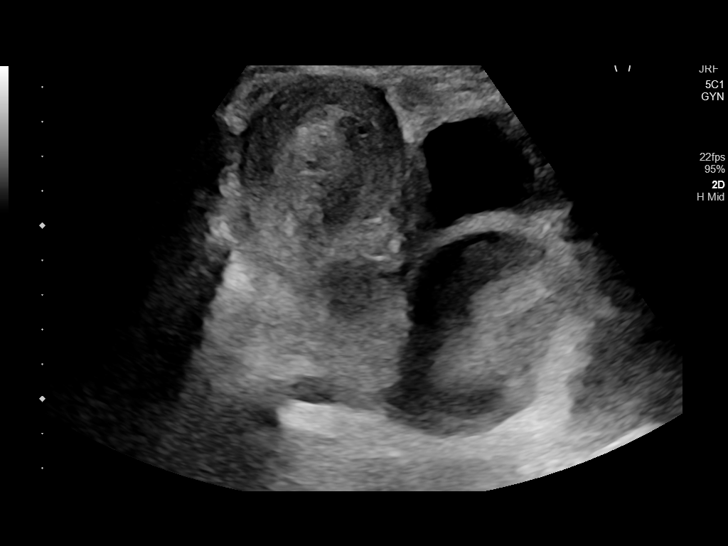
[im 10/58]
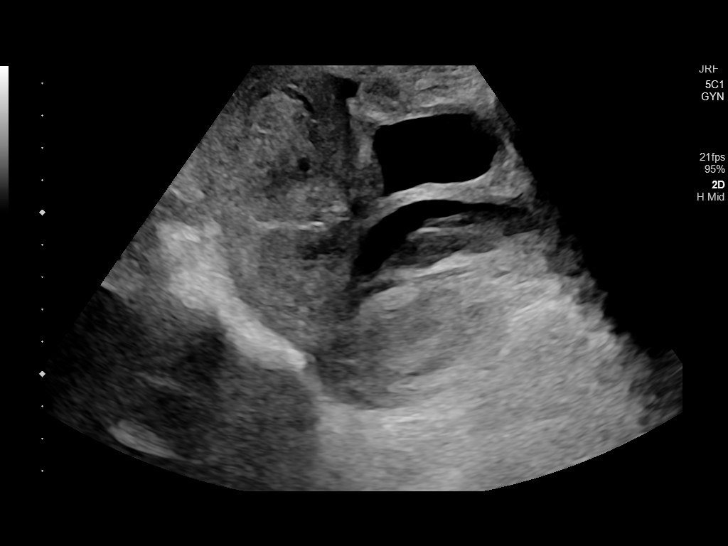
[im 15/58]
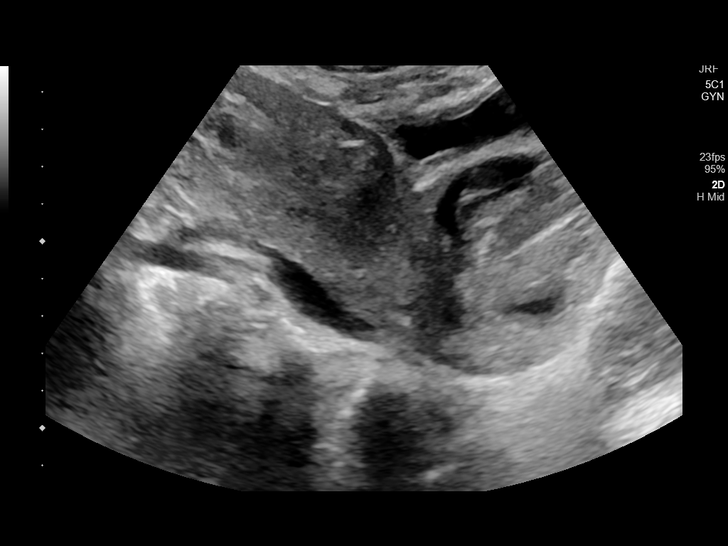
[im 20/58]
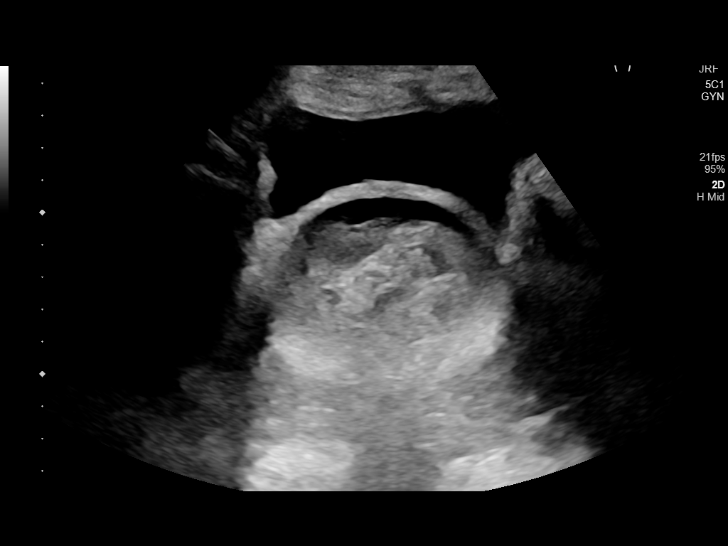
[im 22/58]
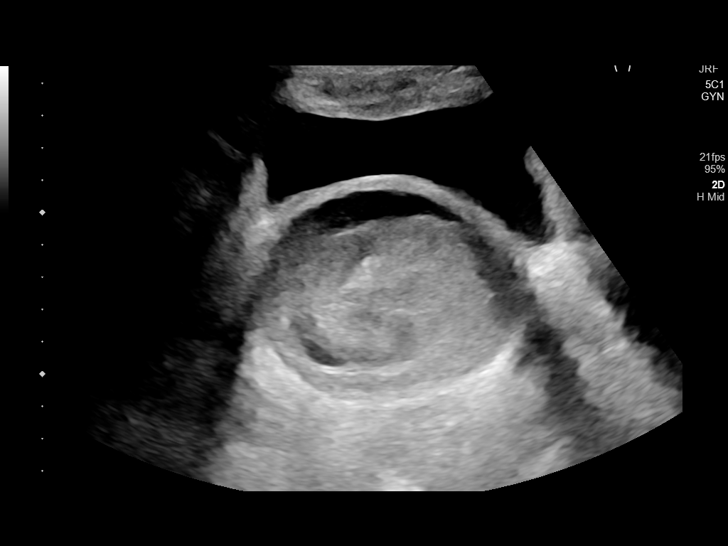
[im 27/58]
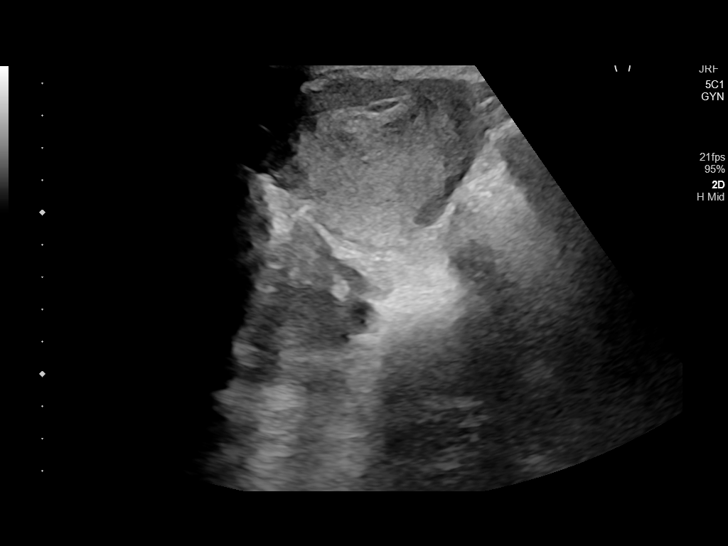
[im 31/58]
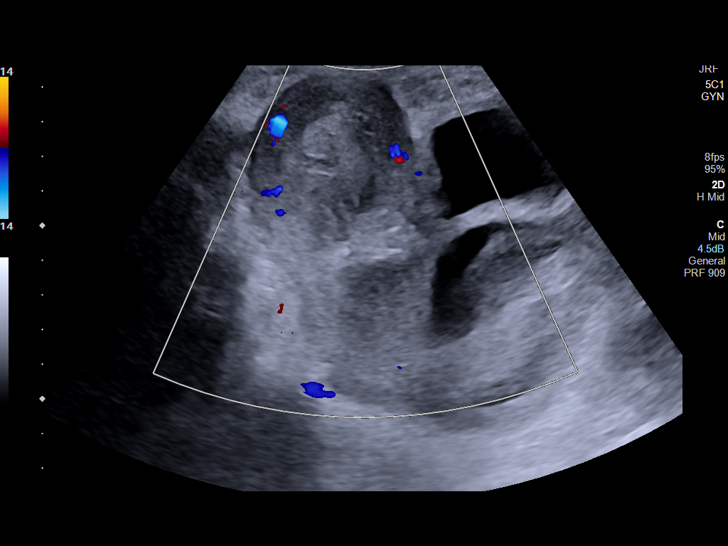
[im 36/58]
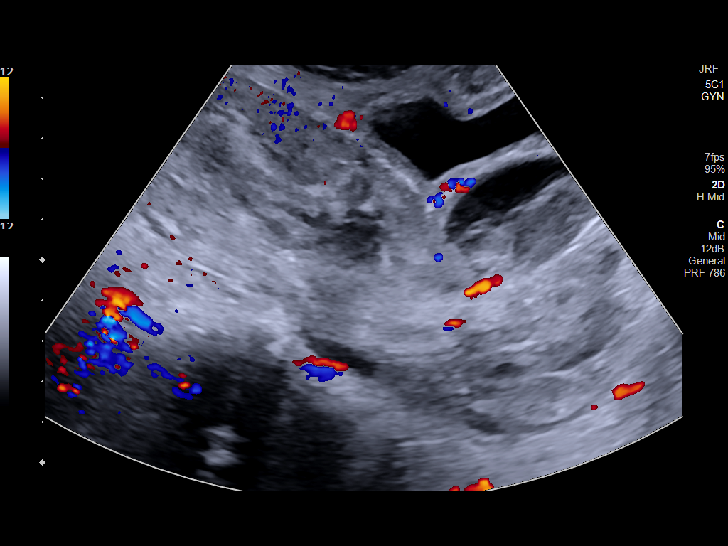
[im 39/58]
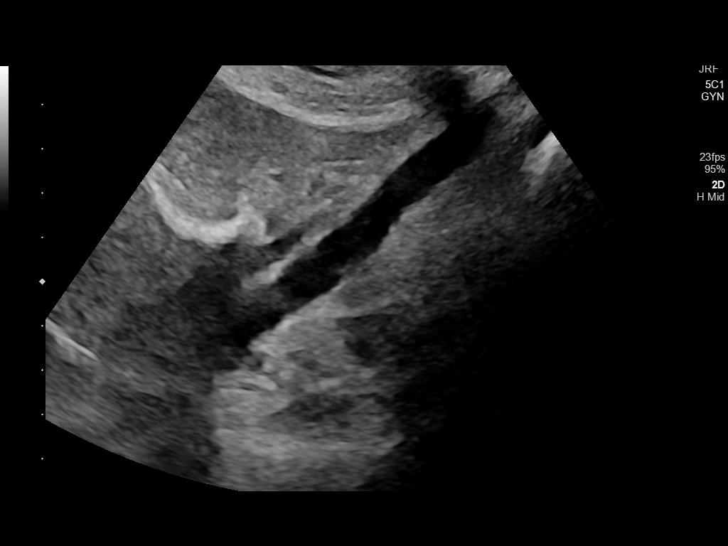
[im 43/58]
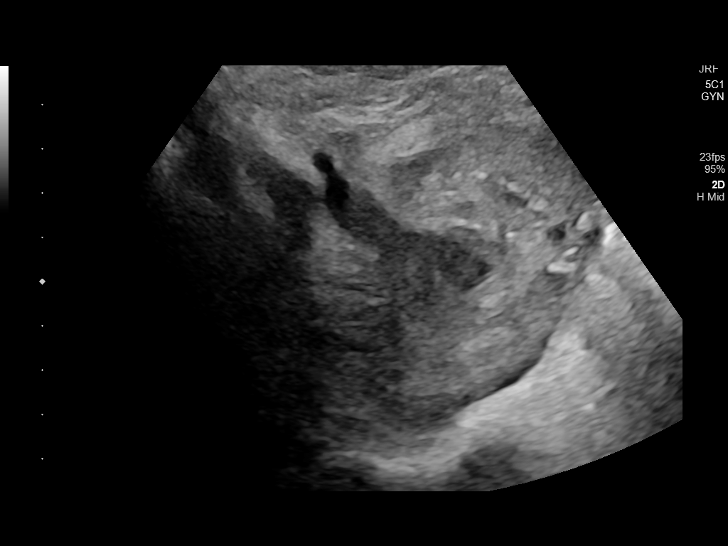
[im 48/58]
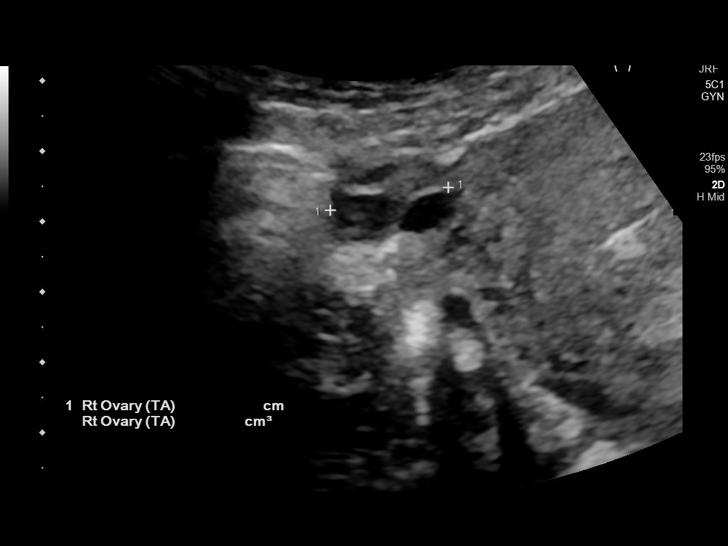
[im 53/58]
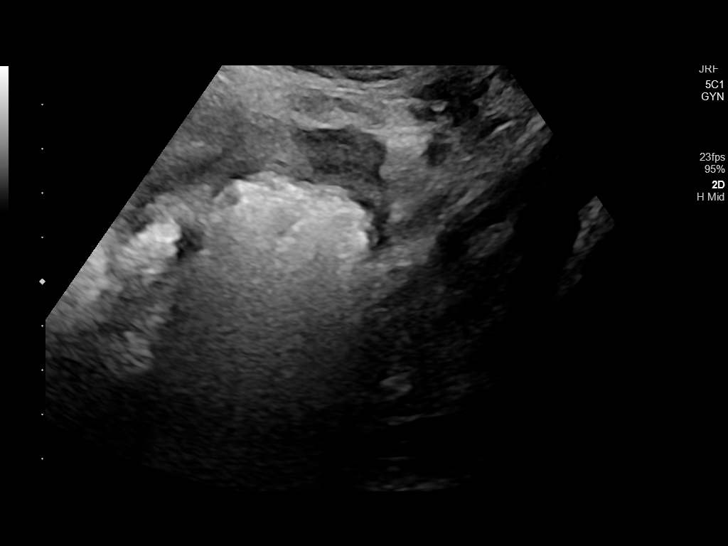
[im 58/58]
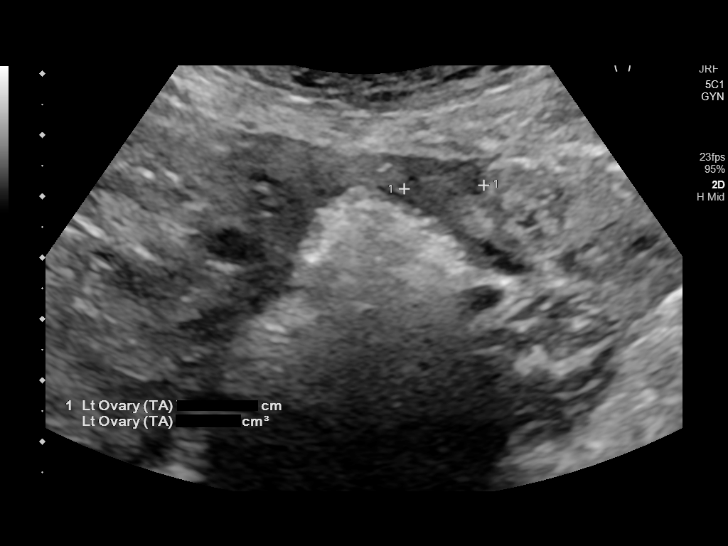

[14 of 25 positions shown; findings below may reference images not displayed]

FINDINGS: Uterus

Measurements: 12 x 5 x 7 cm = volume: 220 mL. No myometrial findings
other than anterior lower uterine segment distortion correlating
with history of Cesarian section

Endometrium

Diffusely heterogeneous and thickened to 27 mm with continuation
through the cervix into the upper vagina. No visible underlying
vascular lesion on color Doppler images

Right ovary

2.3 cc volume.  Normal appearance/no adnexal mass.

Left ovary

1.4 cc volume.  Normal appearance/no adnexal mass.
IMPRESSION: Blood clot appearance distending the endometrial and endocervical
canals and extending into the upper vagina. No visible vascular
lesion by color Doppler.

## 2023-01-20 IMAGING — CT CT HEAD W/O CM
4 series · 16 of 47 positions shown, 18 images · non-contrast
Comparison: None Available.

CLINICAL DATA: Head trauma.  Fell on floor.



[Series 3: head bone · axial · 0.39mm/px · z∈[-191,-165]mm · 3 of 67 slices shown]
[im 7/67  bone]
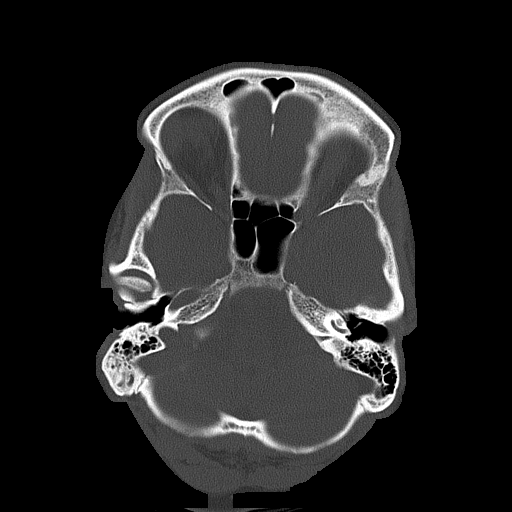
[im 14/67  bone]
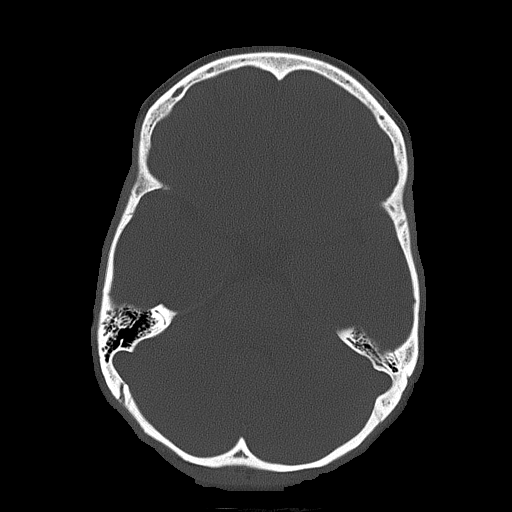
[im 20/67  bone]
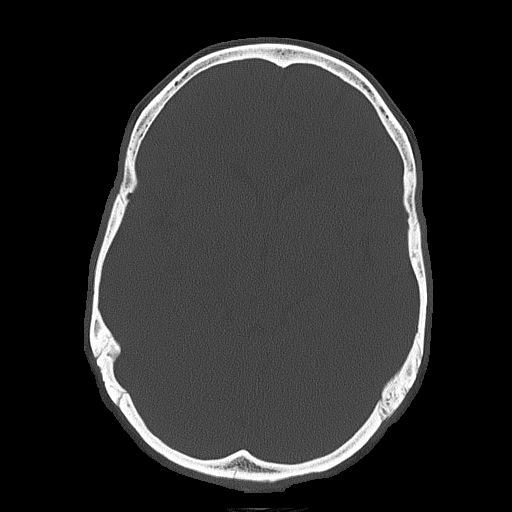

[Series 4: head wo · axial · 0.39mm/px · z∈[-188,-93]mm · 7 of 27 slices shown, 9 images]
[im 4/27  brain]
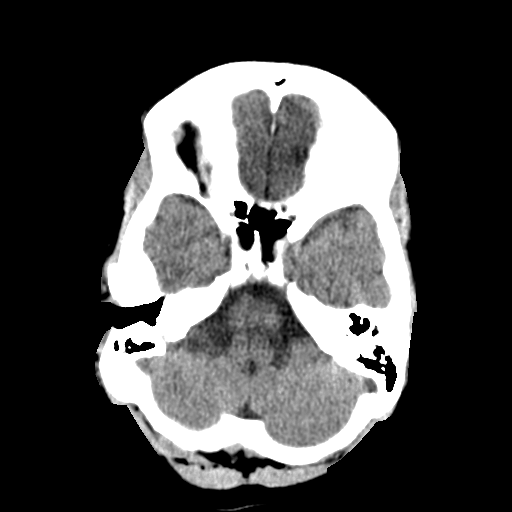
[im 4/27  bone]
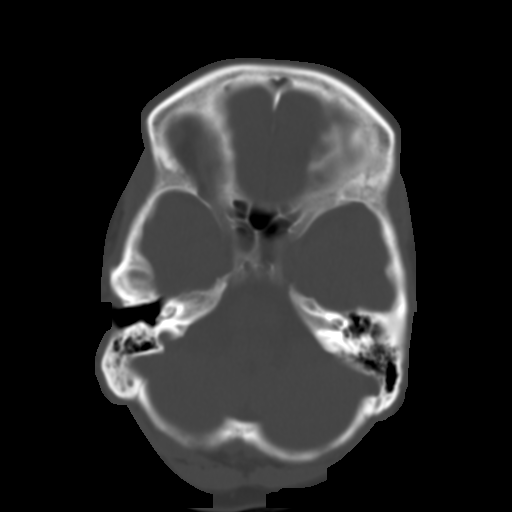
[im 7/27  brain]
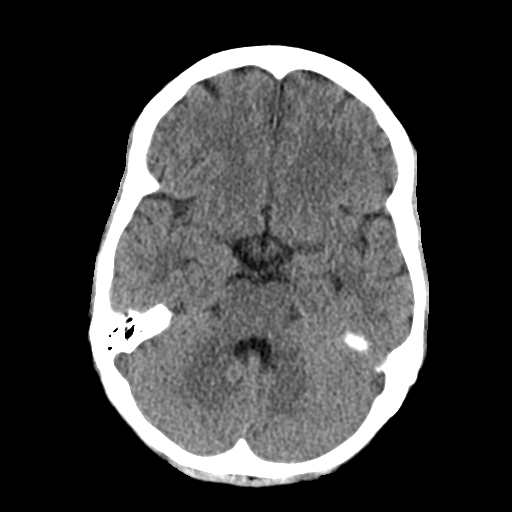
[im 10/27  brain]
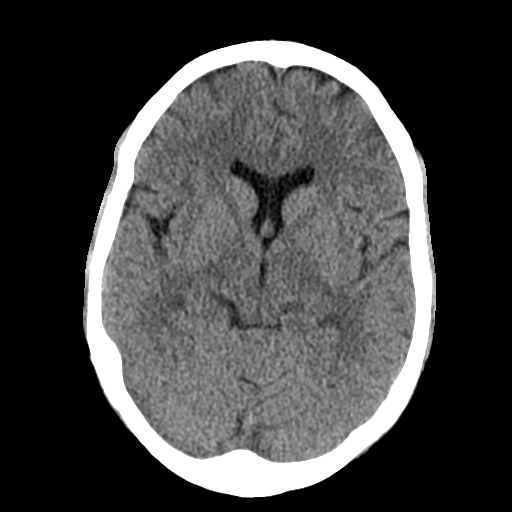
[im 14/27  brain]
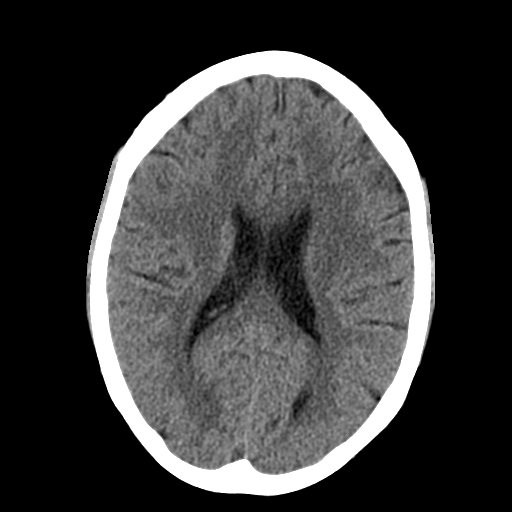
[im 17/27  brain]
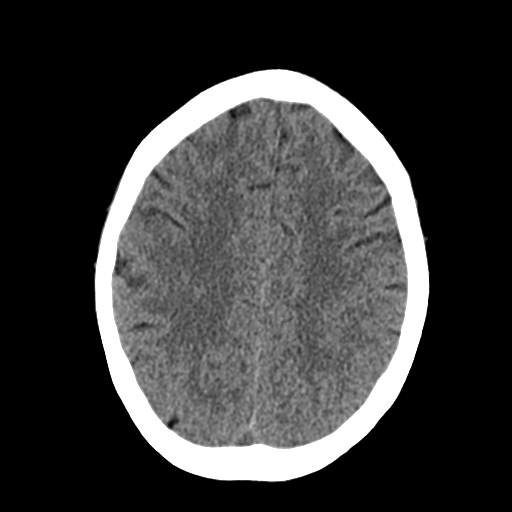
[im 17/27  bone]
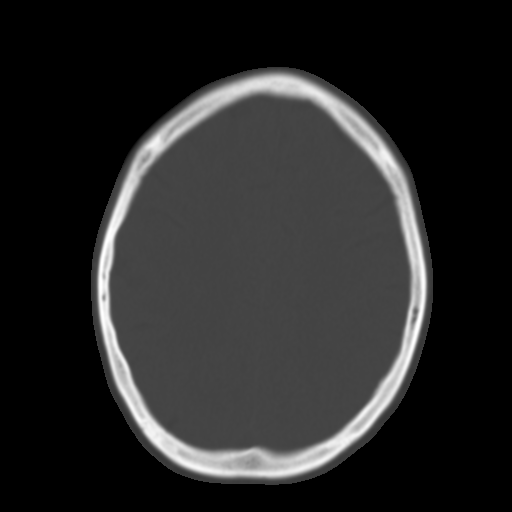
[im 20/27  brain]
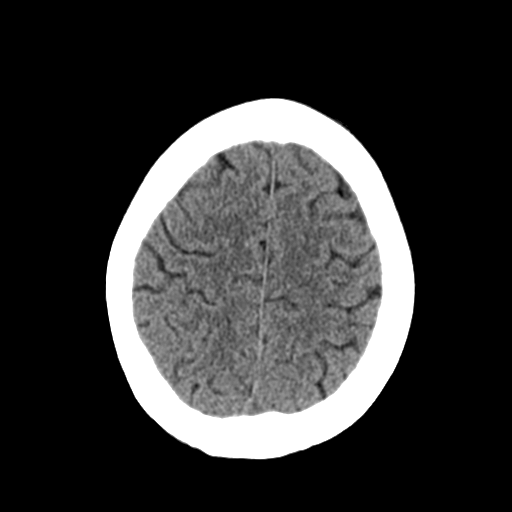
[im 23/27  brain]
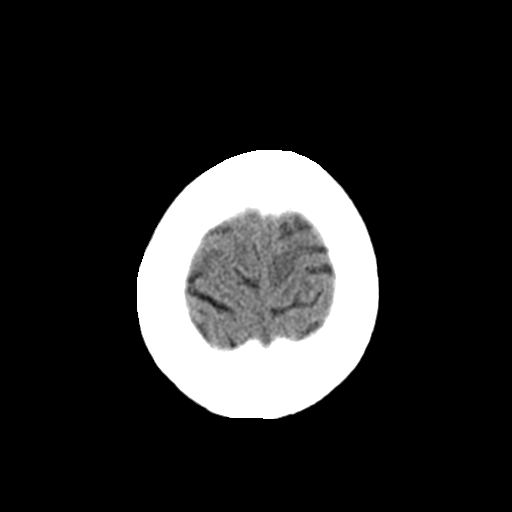

[Series 5: coronal soft tissue · coronal · 0.29mm/px · 3 of 65 slices shown]
[im 22/65  brain]
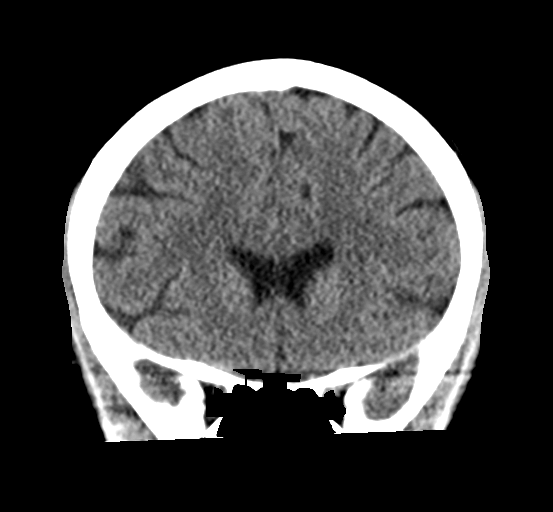
[im 29/65  brain]
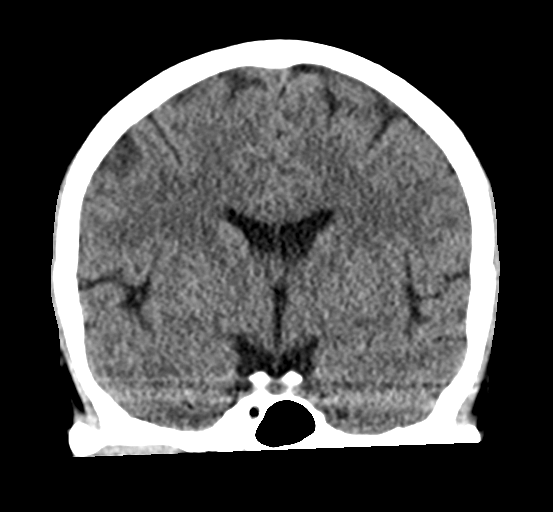
[im 36/65  brain]
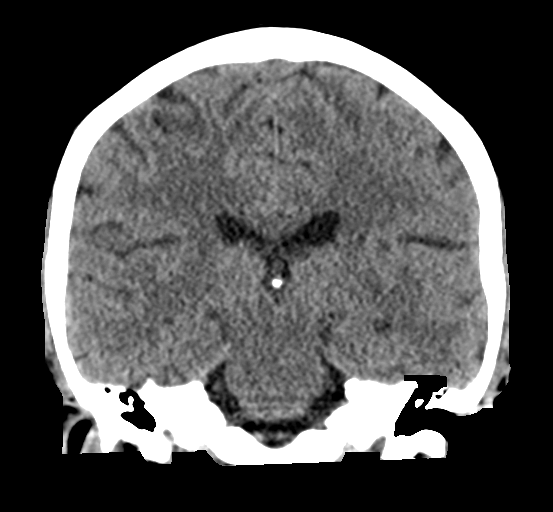

[Series 6: sagittal soft tissue · sagittal · 0.29mm/px · 3 of 55 slices shown]
[im 19/55  brain]
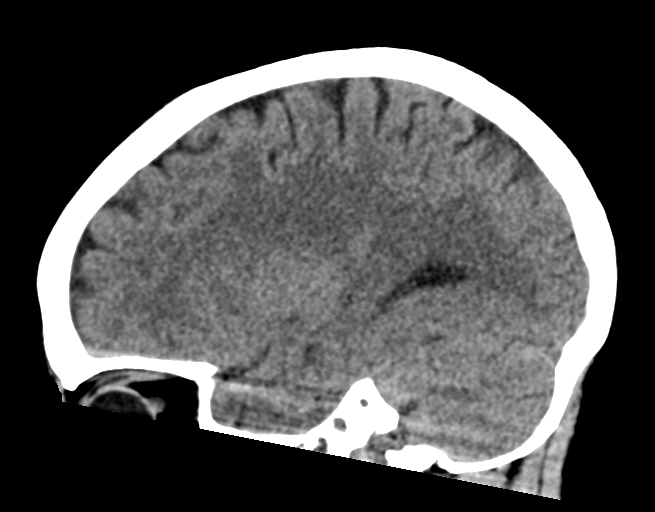
[im 28/55  brain]
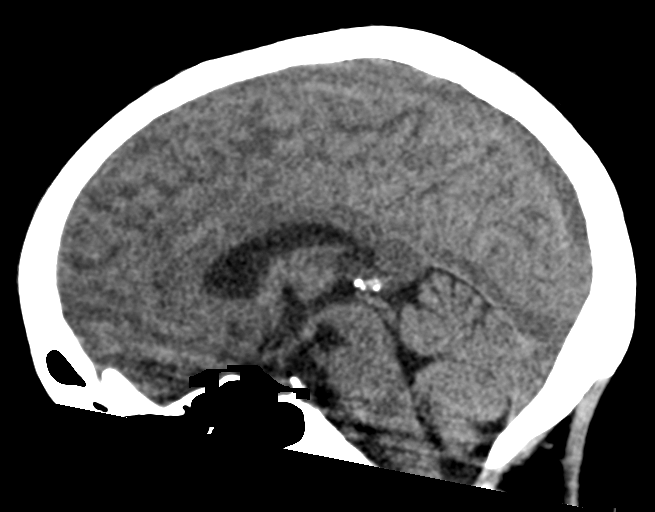
[im 37/55  brain]
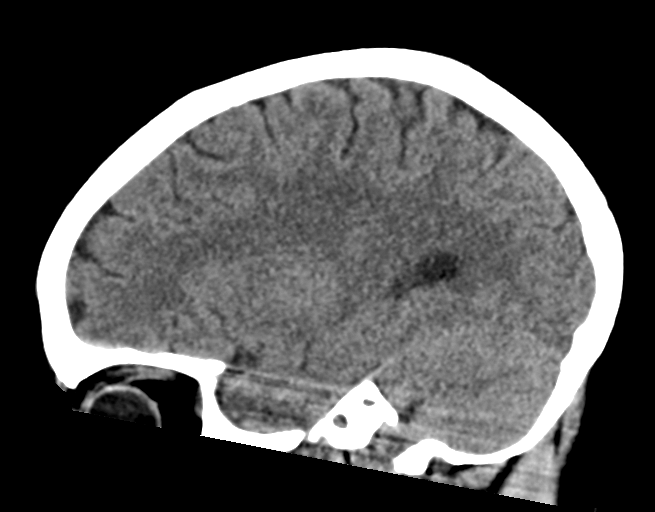

[16 of 47 positions shown; findings below may reference images not displayed]

FINDINGS: CT HEAD FINDINGS

Brain: No evidence of acute infarction, hemorrhage, hydrocephalus,
extra-axial collection or mass lesion/mass effect.

Vascular: No hyperdense vessel or unexpected calcification.

Skull: Normal. Negative for fracture or focal lesion.

Sinuses/Orbits: No acute finding.

Other: None

CT CERVICAL SPINE FINDINGS

Alignment: There is reversal of normal cervical lordosis which
likely reflects patient positioning. No posttraumatic malalignment.

Skull base and vertebrae: No acute fracture. No primary bone lesion
or focal pathologic process.

Soft tissues and spinal canal: No prevertebral fluid or swelling. No
visible canal hematoma.

Disc levels: The vertebral body heights and disc spaces are well
preserved. No significant spondylosis.

Upper chest: Negative.

Other: None
IMPRESSION: 1. No acute intracranial abnormalities.
2. No evidence for cervical spine fracture or posttraumatic
malalignment.
3. Reversal of normal cervical lordosis which likely reflects
patient positioning.

## 2024-11-03 ENCOUNTER — Emergency Department
Admission: EM | Admit: 2024-11-03 | Discharge: 2024-11-04 | Discharge status: Against Medical Advice | Attending: Emergency Medicine | Admitting: Emergency Medicine

## 2024-11-03 ENCOUNTER — Other Ambulatory Visit: Payer: Self-pay

## 2024-11-03 ENCOUNTER — Encounter: Payer: Self-pay | Admitting: Emergency Medicine

## 2024-11-03 DIAGNOSIS — R339 Retention of urine, unspecified: Secondary | ICD-10-CM | POA: Diagnosis present

## 2024-11-03 DIAGNOSIS — Z5321 Procedure and treatment not carried out due to patient leaving prior to being seen by health care provider: Secondary | ICD-10-CM | POA: Insufficient documentation

## 2024-11-03 LAB — POC URINE PREG, ED: Preg Test, Ur: NEGATIVE

## 2024-11-03 LAB — URINALYSIS, ROUTINE W REFLEX MICROSCOPIC
Bilirubin Urine: NEGATIVE
Glucose, UA: NEGATIVE mg/dL
Ketones, ur: NEGATIVE mg/dL
Leukocytes,Ua: NEGATIVE
Nitrite: NEGATIVE
Protein, ur: NEGATIVE mg/dL
Specific Gravity, Urine: 1 — ABNORMAL LOW (ref 1.005–1.030)
pH: 6 (ref 5.0–8.0)

## 2024-11-03 NOTE — ED Triage Notes (Signed)
 Pt in via [facility], called out as pt was found by police barefoot and in bushes [place] a nearby neighborhood. Pt tearful and anxious on their arrival - c/o urinary incontinence and umbilical hernia (hx of this)  VS w/EMS: 127/87 98% 121 CBG 132

## 2024-11-04 NOTE — ED Provider Notes (Signed)
 Patient LWBS after triage.     Adit Riddles, Josette SAILOR, DO 11/04/24 [phone]

## 2024-11-04 NOTE — ED Notes (Signed)
 Pt no answer x 3, assumed LWBS
